# Patient Record
Sex: Female | Born: 1935 | Race: White | Hispanic: No | Smoking: Never smoker
Health system: Southern US, Community
[De-identification: ages and names within clinical notes are randomized; demographics above are authoritative.]

## PROBLEM LIST (undated history)

## (undated) DIAGNOSIS — M1990 Unspecified osteoarthritis, unspecified site: Secondary | ICD-10-CM

## (undated) DIAGNOSIS — I10 Essential (primary) hypertension: Secondary | ICD-10-CM

## (undated) DIAGNOSIS — I517 Cardiomegaly: Secondary | ICD-10-CM

## (undated) DIAGNOSIS — R2689 Other abnormalities of gait and mobility: Secondary | ICD-10-CM

## (undated) DIAGNOSIS — R0602 Shortness of breath: Secondary | ICD-10-CM

## (undated) DIAGNOSIS — M8580 Other specified disorders of bone density and structure, unspecified site: Secondary | ICD-10-CM

## (undated) DIAGNOSIS — M549 Dorsalgia, unspecified: Secondary | ICD-10-CM

## (undated) DIAGNOSIS — I4891 Unspecified atrial fibrillation: Secondary | ICD-10-CM

## (undated) DIAGNOSIS — H269 Unspecified cataract: Secondary | ICD-10-CM

## (undated) DIAGNOSIS — I509 Heart failure, unspecified: Secondary | ICD-10-CM

## (undated) DIAGNOSIS — B029 Zoster without complications: Secondary | ICD-10-CM

## (undated) DIAGNOSIS — J90 Pleural effusion, not elsewhere classified: Secondary | ICD-10-CM

## (undated) HISTORY — DX: Essential (primary) hypertension: I10

## (undated) SURGERY — Surgical Case
Anesthesia: *Unknown

---
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
## 1998-11-07 ENCOUNTER — Other Ambulatory Visit: Admission: RE | Admit: 1998-11-07 | Discharge: 1998-11-07 | Payer: Self-pay | Admitting: Specialist

## 2000-09-21 ENCOUNTER — Other Ambulatory Visit: Admission: RE | Admit: 2000-09-21 | Discharge: 2000-09-21 | Payer: Self-pay | Admitting: Family Medicine

## 2001-09-27 ENCOUNTER — Other Ambulatory Visit: Admission: RE | Admit: 2001-09-27 | Discharge: 2001-09-27 | Payer: Self-pay | Admitting: Family Medicine

## 2002-11-08 ENCOUNTER — Other Ambulatory Visit: Admission: RE | Admit: 2002-11-08 | Discharge: 2002-11-08 | Payer: Self-pay | Admitting: Family Medicine

## 2003-12-25 ENCOUNTER — Other Ambulatory Visit: Admission: RE | Admit: 2003-12-25 | Discharge: 2003-12-25 | Payer: Self-pay | Admitting: Family Medicine

## 2003-12-25 ENCOUNTER — Encounter: Payer: Self-pay | Admitting: Family Medicine

## 2003-12-25 LAB — CONVERTED CEMR LAB: Pap Smear: NORMAL

## 2004-05-31 ENCOUNTER — Ambulatory Visit: Payer: Self-pay | Admitting: Family Medicine

## 2004-07-10 ENCOUNTER — Ambulatory Visit: Payer: Self-pay | Admitting: Family Medicine

## 2005-01-09 ENCOUNTER — Ambulatory Visit: Payer: Self-pay | Admitting: Family Medicine

## 2005-03-04 ENCOUNTER — Ambulatory Visit: Payer: Self-pay | Admitting: Family Medicine

## 2005-03-12 ENCOUNTER — Ambulatory Visit: Payer: Self-pay | Admitting: Gastroenterology

## 2005-04-25 ENCOUNTER — Ambulatory Visit: Payer: Self-pay | Admitting: Family Medicine

## 2006-01-14 ENCOUNTER — Ambulatory Visit: Payer: Self-pay | Admitting: Family Medicine

## 2006-04-23 ENCOUNTER — Ambulatory Visit: Payer: Self-pay | Admitting: Family Medicine

## 2006-06-01 ENCOUNTER — Ambulatory Visit: Payer: Self-pay | Admitting: Family Medicine

## 2006-06-12 ENCOUNTER — Ambulatory Visit: Payer: Self-pay | Admitting: Family Medicine

## 2007-03-02 ENCOUNTER — Encounter: Payer: Self-pay | Admitting: Family Medicine

## 2007-03-02 DIAGNOSIS — H519 Unspecified disorder of binocular movement: Secondary | ICD-10-CM | POA: Insufficient documentation

## 2007-03-02 DIAGNOSIS — I10 Essential (primary) hypertension: Secondary | ICD-10-CM | POA: Insufficient documentation

## 2007-04-02 ENCOUNTER — Ambulatory Visit: Payer: Self-pay | Admitting: Family Medicine

## 2007-04-02 DIAGNOSIS — Z860100 Personal history of colon polyps, unspecified: Secondary | ICD-10-CM | POA: Insufficient documentation

## 2007-04-05 LAB — CONVERTED CEMR LAB
Albumin: 4.2 g/dL (ref 3.5–5.2)
Alkaline Phosphatase: 63 units/L (ref 39–117)
Basophils Absolute: 0 10*3/uL (ref 0.0–0.1)
Calcium: 9.8 mg/dL (ref 8.4–10.5)
Chloride: 102 meq/L (ref 96–112)
Cholesterol: 187 mg/dL (ref 0–200)
Eosinophils Absolute: 0.2 10*3/uL (ref 0.0–0.6)
GFR calc Af Amer: 64 mL/min
Glucose, Bld: 114 mg/dL — ABNORMAL HIGH (ref 70–99)
HDL: 36.9 mg/dL — ABNORMAL LOW (ref >39.0)
Lymphocytes Relative: 19.8 % (ref 12.0–46.0)
MCHC: 34.2 g/dL (ref 30.0–36.0)
MCV: 91.4 fL (ref 78.0–100.0)
Monocytes Relative: 6.3 % (ref 3.0–11.0)
Neutro Abs: 5.8 10*3/uL (ref 1.4–7.7)
Platelets: 322 10*3/uL (ref 150–400)
TSH: 4.71 microintl units/mL (ref 0.35–5.50)
Total Protein: 8.1 g/dL (ref 6.0–8.3)
Vit D, 1,25-Dihydroxy: 39 (ref 30–89)

## 2007-04-27 ENCOUNTER — Encounter: Payer: Self-pay | Admitting: Family Medicine

## 2007-05-12 ENCOUNTER — Encounter: Payer: Self-pay | Admitting: Family Medicine

## 2007-05-18 ENCOUNTER — Encounter (INDEPENDENT_AMBULATORY_CARE_PROVIDER_SITE_OTHER): Payer: Self-pay | Admitting: *Deleted

## 2007-06-09 ENCOUNTER — Ambulatory Visit: Payer: Self-pay | Admitting: Gastroenterology

## 2007-06-09 ENCOUNTER — Encounter: Payer: Self-pay | Admitting: Family Medicine

## 2007-06-18 ENCOUNTER — Encounter: Payer: Self-pay | Admitting: Family Medicine

## 2007-07-05 ENCOUNTER — Ambulatory Visit: Payer: Self-pay | Admitting: Family Medicine

## 2008-01-13 ENCOUNTER — Encounter: Payer: Self-pay | Admitting: Family Medicine

## 2008-03-22 ENCOUNTER — Telehealth (INDEPENDENT_AMBULATORY_CARE_PROVIDER_SITE_OTHER): Payer: Self-pay | Admitting: *Deleted

## 2008-03-27 ENCOUNTER — Ambulatory Visit: Payer: Self-pay | Admitting: Family Medicine

## 2008-04-12 ENCOUNTER — Ambulatory Visit: Payer: Self-pay | Admitting: Family Medicine

## 2008-04-12 ENCOUNTER — Other Ambulatory Visit: Admission: RE | Admit: 2008-04-12 | Discharge: 2008-04-12 | Payer: Self-pay | Admitting: Family Medicine

## 2008-04-12 ENCOUNTER — Encounter: Payer: Self-pay | Admitting: Family Medicine

## 2008-04-12 DIAGNOSIS — J309 Allergic rhinitis, unspecified: Secondary | ICD-10-CM | POA: Insufficient documentation

## 2008-04-14 LAB — CONVERTED CEMR LAB
Albumin: 4.1 g/dL (ref 3.5–5.2)
Basophils Relative: 0.7 % (ref 0.0–3.0)
Chloride: 102 meq/L (ref 96–112)
Eosinophils Absolute: 0.2 10*3/uL (ref 0.0–0.7)
Eosinophils Relative: 2.3 % (ref 0.0–5.0)
GFR calc Af Amer: 63 mL/min
GFR calc non Af Amer: 52 mL/min
HCT: 40.6 % (ref 36.0–46.0)
HDL: 33.9 mg/dL — ABNORMAL LOW (ref >39.0)
MCV: 94.7 fL (ref 78.0–100.0)
Monocytes Absolute: 0.4 10*3/uL (ref 0.1–1.0)
Neutro Abs: 5.9 10*3/uL (ref 1.4–7.7)
Platelets: 272 10*3/uL (ref 150–400)
Potassium: 3.8 meq/L (ref 3.5–5.1)
RBC: 4.28 M/uL (ref 3.87–5.11)
Sodium: 139 meq/L (ref 135–145)
Total Protein: 7.8 g/dL (ref 6.0–8.3)
Vit D, 1,25-Dihydroxy: 38 (ref 30–89)
WBC: 8.2 10*3/uL (ref 4.5–10.5)

## 2008-05-15 ENCOUNTER — Encounter: Payer: Self-pay | Admitting: Family Medicine

## 2008-06-02 ENCOUNTER — Telehealth: Payer: Self-pay | Admitting: Family Medicine

## 2008-06-09 ENCOUNTER — Encounter: Payer: Self-pay | Admitting: Family Medicine

## 2008-06-15 ENCOUNTER — Encounter: Payer: Self-pay | Admitting: Family Medicine

## 2008-07-04 ENCOUNTER — Ambulatory Visit: Payer: Self-pay | Admitting: Internal Medicine

## 2008-07-04 ENCOUNTER — Ambulatory Visit: Payer: Self-pay | Admitting: General Surgery

## 2008-07-10 ENCOUNTER — Encounter: Payer: Self-pay | Admitting: Family Medicine

## 2008-07-10 ENCOUNTER — Ambulatory Visit: Payer: Self-pay | Admitting: General Surgery

## 2008-07-12 ENCOUNTER — Encounter: Payer: Self-pay | Admitting: Family Medicine

## 2008-07-20 ENCOUNTER — Encounter: Payer: Self-pay | Admitting: Family Medicine

## 2009-01-08 ENCOUNTER — Telehealth: Payer: Self-pay | Admitting: Family Medicine

## 2009-01-22 ENCOUNTER — Encounter: Payer: Self-pay | Admitting: Family Medicine

## 2009-04-16 ENCOUNTER — Ambulatory Visit: Payer: Self-pay | Admitting: Family Medicine

## 2009-04-18 ENCOUNTER — Encounter (INDEPENDENT_AMBULATORY_CARE_PROVIDER_SITE_OTHER): Payer: Self-pay | Admitting: *Deleted

## 2009-04-18 LAB — CONVERTED CEMR LAB
ALT: 19 units/L (ref 0–35)
AST: 17 units/L (ref 0–37)
Albumin: 4.1 g/dL (ref 3.5–5.2)
Alkaline Phosphatase: 61 units/L (ref 39–117)
Basophils Relative: 1 % (ref 0.0–3.0)
Calcium: 9.1 mg/dL (ref 8.4–10.5)
Chloride: 102 meq/L (ref 96–112)
Cholesterol: 179 mg/dL (ref 0–200)
Eosinophils Absolute: 0.2 10*3/uL (ref 0.0–0.7)
Eosinophils Relative: 3.1 % (ref 0.0–5.0)
Hemoglobin: 14.6 g/dL (ref 12.0–15.0)
Lymphocytes Relative: 19.9 % (ref 12.0–46.0)
MCHC: 33.7 g/dL (ref 30.0–36.0)
MCV: 94.3 fL (ref 78.0–100.0)
Neutro Abs: 4.8 10*3/uL (ref 1.4–7.7)
Phosphorus: 3.6 mg/dL (ref 2.3–4.6)
Potassium: 4.6 meq/L (ref 3.5–5.1)
RBC: 4.59 M/uL (ref 3.87–5.11)
TSH: 3.63 microintl units/mL (ref 0.35–5.50)
Total Protein: 7.7 g/dL (ref 6.0–8.3)
WBC: 6.9 10*3/uL (ref 4.5–10.5)

## 2009-05-16 ENCOUNTER — Encounter: Payer: Self-pay | Admitting: Family Medicine

## 2009-05-28 ENCOUNTER — Encounter (INDEPENDENT_AMBULATORY_CARE_PROVIDER_SITE_OTHER): Payer: Self-pay | Admitting: *Deleted

## 2009-06-30 HISTORY — PX: BREAST BIOPSY: SHX20

## 2010-04-23 ENCOUNTER — Ambulatory Visit: Payer: Self-pay | Admitting: Family Medicine

## 2010-05-06 ENCOUNTER — Ambulatory Visit: Payer: Self-pay | Admitting: Family Medicine

## 2010-05-06 ENCOUNTER — Telehealth: Payer: Self-pay | Admitting: Family Medicine

## 2010-05-07 LAB — CONVERTED CEMR LAB
ALT: 18 units/L (ref 0–35)
AST: 18 units/L (ref 0–37)
Alkaline Phosphatase: 69 units/L (ref 39–117)
Basophils Relative: 0.2 % (ref 0.0–3.0)
CO2: 30 meq/L (ref 19–32)
Calcium: 9.7 mg/dL (ref 8.4–10.5)
Chloride: 101 meq/L (ref 96–112)
Direct LDL: 132.4 mg/dL
Eosinophils Relative: 2.3 % (ref 0.0–5.0)
GFR calc non Af Amer: 50.81 mL/min (ref >60)
HCT: 46 % (ref 36.0–46.0)
Hemoglobin: 15.9 g/dL — ABNORMAL HIGH (ref 12.0–15.0)
Lymphs Abs: 1.5 10*3/uL (ref 0.7–4.0)
MCV: 94.8 fL (ref 78.0–100.0)
Monocytes Absolute: 0.4 10*3/uL (ref 0.1–1.0)
Monocytes Relative: 4.6 % (ref 3.0–12.0)
Potassium: 4.5 meq/L (ref 3.5–5.1)
RBC: 4.86 M/uL (ref 3.87–5.11)
Sodium: 141 meq/L (ref 135–145)
TSH: 3.84 microintl units/mL (ref 0.35–5.50)
Total CHOL/HDL Ratio: 5
Triglycerides: 204 mg/dL — ABNORMAL HIGH (ref 0.0–149.0)
Vit D, 25-Hydroxy: 43 ng/mL (ref 30–89)
WBC: 8.9 10*3/uL (ref 4.5–10.5)

## 2010-05-28 ENCOUNTER — Encounter: Payer: Self-pay | Admitting: Family Medicine

## 2010-05-29 ENCOUNTER — Encounter: Payer: Self-pay | Admitting: Family Medicine

## 2010-05-30 LAB — HM DEXA SCAN

## 2010-06-04 ENCOUNTER — Encounter: Payer: Self-pay | Admitting: Family Medicine

## 2010-06-07 ENCOUNTER — Encounter: Payer: Self-pay | Admitting: Family Medicine

## 2010-06-12 ENCOUNTER — Ambulatory Visit: Payer: Self-pay | Admitting: Family Medicine

## 2010-06-12 DIAGNOSIS — E785 Hyperlipidemia, unspecified: Secondary | ICD-10-CM

## 2010-06-12 DIAGNOSIS — M810 Age-related osteoporosis without current pathological fracture: Secondary | ICD-10-CM

## 2010-07-30 NOTE — Assessment & Plan Note (Signed)
Summary: flu shot//lch  Nurse Visit   Allergies: 1)  Ace Inhibitors  Orders Added: 1)  Flu Vaccine 89yrs + MEDICARE PATIENTS [Q2039] 2)  Administration Flu vaccine - MCR [G0008]  Flu Vaccine Consent Questions     Do you have a history of severe allergic reactions to this vaccine? no    Any prior history of allergic reactions to egg and/or gelatin? no    Do you have a sensitivity to the preservative Thimersol? no    Do you have a past history of Guillan-Barre Syndrome? no    Do you currently have an acute febrile illness? no    Have you ever had a severe reaction to latex? no    Vaccine information given and explained to patient? yes    Are you currently pregnant? no    Lot Number:[id number]   Exp Date:12/28/2010   Site Given  Left Deltoid IM

## 2010-07-30 NOTE — Letter (Signed)
Summary: Results Follow up Letter  [place]  [address] [place], [place] [address]   Phone: [phone]  Fax: [phone]    06/04/2010 MRN: [id number]    [facility] [address] [place], [place]  [address]    Dear Ms. Jamesetta Orleans,  The following are the results of your recent test(s):  Test         Result    Pap Smear:        Normal _____  Not Normal _____ Comments: ______________________________________________________ Cholesterol: LDL(Bad cholesterol):         Your goal is less than:         HDL (Good cholesterol):       Your goal is more than: Comments:  ______________________________________________________ Mammogram:        Normal _____  Not Normal _____ Comments:  ___________________________________________________________________ Hemoccult:        Normal _____  Not normal _______ Comments:    _____________________________________________________________________ Other Tests:The bone density shows osteopenia has improved since last bone density. Please continue Calcium and Vitamin D. Will plan to re check bone density in 2 years.    We routinely do not discuss normal results over the telephone.  If you desire a copy of the results, or you have any questions about this information we can discuss them at your next office visit.   Sincerely,    Idamae Schuller Tower,MD  MT/ri

## 2010-07-30 NOTE — Assessment & Plan Note (Signed)
Summary: CPX/CLE   Vital Signs:  Patient profile:   75 year old female Height:      65.25 inches Weight:      186.50 pounds BMI:     30.91 Temp:     98 degrees F oral Pulse rate:   80 / minute Pulse rhythm:   regular BP sitting:   140 / 90  (left arm) Cuff size:   large  Vitals Entered By: Lewanda Rife LPN (May 06, 2010 10:16 AM)  Serial Vital Signs/Assessments:  Time      Position  BP       Pulse  Resp  Temp     By                     149/90                         Judith Part MD  CC: check up , post menup   History of Present Illness: here for check up of chronic med problems  has been feeling good   wt is up 2 lb with bmi of 30  has been trying to eat healthy -- cooking more at home , more soup and salads  watching her salt  getting exercise but not every day- walking mostly  does trips intentionally where she has to do a lot of walking   HTN has been fairly controlled  bp first check 140/90--has not checked outside office   OP past - with fx, last dexa osteopenia - improved 11/09 has been on fosamax too long  is due ca and D  needs to change hctz -- too frequent urination  colonosc 12/08- polyp hyperplastic  re check-- ? due   pap nl 10/09 no abn paps  no new partners or symptoms    mam 11/10 self exam - is good about that - no lumps or changes   Td 07 flu shot last mo  ptx up to date  has had shingles   Allergies: 1)  Ace Inhibitors  Past History:  Past Medical History: Last updated: 04-23-2009 Hypertension Osteoporosis allergic rhinitis strabismus shingles in past   GI -- Dr Nils Flack   Past Surgical History: Last updated: 05/24/2008 Dexa- osteoporosis LS, osteopenia hip (09/2000),    worse osteopenia femoral neck, OP LS (12/2003),   OP lS, osteopenia femoral neck (04/2006) Fractural left clavicle (1974) Fracture right clavicle (1992) Colonoscopy- polyps, diverticulosis (02/2005) colonosc 12/08 polyp,  diverticulosis (11/09) dexa - osteopenia- very slt imp in spine  T score (-2.49)  Family History: Last updated: April 23, 2009 Father: deceased age 15- parkinson's Mother: deceased age 43- HTN, liver cancer Siblings: 2 sisters aunt with breast ca sister colon ca, depression, dementia   Social History: Last updated: 04/12/2008 Marital Status: divorced Children: 3 Occupation:  non smoker  never drinks alcohol   Risk Factors: Smoking Status: never (03/02/2007)  Review of Systems General:  Denies fatigue, loss of appetite, and malaise. Eyes:  Denies blurring and eye irritation. CV:  Denies chest pain or discomfort, lightheadness, palpitations, shortness of breath with exertion, and swelling of feet. Resp:  Denies cough and shortness of breath. GI:  Denies abdominal pain, bloody stools, change in bowel habits, indigestion, and nausea. GU:  Denies dysuria and urinary frequency. MS:  Denies low back pain and cramps. Derm:  Denies itching, lesion(s), poor wound healing, and rash. Neuro:  Denies numbness and tingling. Psych:  Denies anxiety and  depression. Endo:  Denies cold intolerance, excessive thirst, excessive urination, and heat intolerance. Heme:  Denies abnormal bruising and bleeding.  Physical Exam  General:  overweight but generally well appearing  Head:  normocephalic, atraumatic, and no abnormalities observed.   Eyes:  vision grossly intact, pupils equal, pupils round, and pupils reactive to light.  no conjunctival pallor, injection or icterus  strabismus is baseline  Ears:  R ear normal and L ear normal.   Nose:  no nasal discharge.   Mouth:  pharynx pink and moist.   Neck:  supple with full rom and no masses or thyromegally, no JVD or carotid bruit  Chest Wall:  No deformities, masses, or tenderness noted. Breasts:  No mass, nodules, thickening, tenderness, bulging, retraction, inflamation, nipple discharge or skin changes noted.   Lungs:  Normal respiratory effort,  chest expands symmetrically. Lungs are clear to auscultation, no crackles or wheezes. Heart:  Normal rate and regular rhythm. S1 and S2 normal without gallop, murmur, click, rub or other extra sounds. Abdomen:  Bowel sounds positive,abdomen soft and non-tender without masses, organomegaly or hernias noted. no renal bruits  Msk:  No deformity or scoliosis noted of thoracic or lumbar spine.  no acute joint changes  Pulses:  R and L carotid,radial,femoral,dorsalis pedis and posterior tibial pulses are full and equal bilaterally Extremities:  No clubbing, cyanosis, edema, or deformity noted with normal full range of motion of all joints.   Neurologic:  sensation intact to light touch, gait normal, and DTRs symmetrical and normal.   Skin:  Intact without suspicious lesions or rashes Cervical Nodes:  No lymphadenopathy noted Axillary Nodes:  No palpable lymphadenopathy Inguinal Nodes:  No significant adenopathy Psych:  normal affect, talkative and pleasant    Impression & Recommendations:  Problem # 1:  OTHER SCREENING MAMMOGRAM (ICD-V76.12) Assessment Comment Only annual mammogram scheduled adv pt to continue regular self breast exams non remarkable breast exam today  Orders: Radiology Referral (Radiology)  Problem # 2:  HYPERTENSION (ICD-401.9) Assessment: Deteriorated  bp is high and pt no longer tol fluid pill due to frequent urination will stop that and replace cardizem with norvasc 5  f/u 4-6 wk  may need to inc dose at that time  disc imp of exercise and wt loss  lab today The following medications were removed from the medication list:    Hydrochlorothiazide 25 Mg Tabs (Hydrochlorothiazide) ..... One by mouth once daily    Cardizem Cd 240 Mg Cp24 (Diltiazem hcl coated beads) ..... One by mouth once daily Her updated medication list for this problem includes:    Cozaar 100 Mg Tabs (Losartan potassium) ..... One by mouth once daily    Norvasc 5 Mg Tabs (Amlodipine besylate)  .Marland Kitchen... 1 by mouth once daily  Orders: Venipuncture (52841) TLB-Lipid Panel (80061-LIPID) TLB-Renal Function Panel (80069-RENAL) TLB-CBC Platelet - w/Differential (85025-CBCD) TLB-TSH (Thyroid Stimulating Hormone) (84443-TSH) TLB-Hepatic/Liver Function Pnl (80076-HEPATIC) T-Vitamin D (25-Hydroxy) (32440-10272)  BP today: 140/90 Prior BP: 150/82 (04/16/2009)  Labs Reviewed: K+: 4.6 (04/16/2009) Creat: : 1.1 (04/16/2009)   Chol: 179 (04/16/2009)   HDL: 35.00 (04/16/2009)   LDL: 112 (04/16/2009)   TG: 159.0 (04/16/2009)  Problem # 3:  OSTEOPOROSIS (ICD-733.00) Assessment: Unchanged schedule dexa stop fosamax since over 5 y rev ca and vit D vit D level today- disc at f/u The following medications were removed from the medication list:    Fosamax 70 Mg Tabs (Alendronate sodium) ..... One by mouth once weekly    Calcium 600 Mg Tabs (  Calcium) ..... One by mouth bid Her updated medication list for this problem includes:    Vitamin D 1000 Unit Tabs (Cholecalciferol) .Marland Kitchen... 1200 units per day in addition to what is in calcium supplement.    Calcium Carbonate-vitamin D 600-400 Mg-unit Tabs (Calcium carbonate-vitamin d) ..... One tablet by mouth twice a day  Orders: Venipuncture (09811) TLB-Lipid Panel (80061-LIPID) TLB-Renal Function Panel (80069-RENAL) TLB-CBC Platelet - w/Differential (85025-CBCD) TLB-TSH (Thyroid Stimulating Hormone) (84443-TSH) TLB-Hepatic/Liver Function Pnl (80076-HEPATIC) T-Vitamin D (25-Hydroxy) (91478-29562) Radiology Referral (Radiology)  Complete Medication List: 1)  Cozaar 100 Mg Tabs (Losartan potassium) .... One by mouth once daily 2)  Vitamin D 1000 Unit Tabs (Cholecalciferol) .Marland Kitchen.. 1200 units per day in addition to what is in calcium supplement. 3)  Calcium Carbonate-vitamin D 600-400 Mg-unit Tabs (Calcium carbonate-vitamin d) .... One tablet by mouth twice a day 4)  Norvasc 5 Mg Tabs (Amlodipine besylate) .Marland Kitchen.. 1 by mouth once daily  Patient  Instructions: 1)  please call Dr Bernita Raisin office (GI ) for pt to see when next colonoscopy is due  2)  we will schedule mammogram and dexa (bone density) at check out 3)  the current recommendation for calcium intake is 1200-1500 mg daily with [phone] IU of vitamin D  4)  stop your fosamax  5)  stop the hctz 6)  stop the cardizem  7)  start norvasc 5 mg daily for blood pressure 8)  follow up in 4-6 weeks to re check blood pressure and review labs 9)  lab today  Prescriptions: NORVASC 5 MG TABS (AMLODIPINE BESYLATE) 1 by mouth once daily  #30 x 2   Entered and Authorized by:   Judith Part MD   Signed by:   Judith Part MD on 05/06/2010   Method used:   Print then Give to Patient   RxID:   [id number] COZAAR 100 MG  TABS (LOSARTAN POTASSIUM) one by mouth once daily  #90 x 3   Entered and Authorized by:   Judith Part MD   Signed by:   Judith Part MD on 05/06/2010   Method used:   Print then Give to Patient   RxID:   [id number]    Orders Added: 1)  Venipuncture [64403] 2)  TLB-Lipid Panel [80061-LIPID] 3)  TLB-Renal Function Panel [80069-RENAL] 4)  TLB-CBC Platelet - w/Differential [85025-CBCD] 5)  TLB-TSH (Thyroid Stimulating Hormone) [84443-TSH] 6)  TLB-Hepatic/Liver Function Pnl [80076-HEPATIC] 7)  T-Vitamin D (25-Hydroxy) [47425-95638] 8)  Radiology Referral [Radiology] 9)  Radiology Referral [Radiology] 10)  Est. Patient Level IV [75643]    Current Allergies (reviewed today): ACE INHIBITORS

## 2010-07-30 NOTE — Progress Notes (Signed)
Summary: regarding colonoscopy  Phone Note Call from Patient   Caller: Patient Call For: Judith Part MD Summary of Call: Pt called to let you know that her GI office said she is not due for another colonoscopy until 05/2012. Initial call taken by: Lowella Petties CMA, AAMA,  May 06, 2010 3:05 PM  Follow-up for Phone Call        thanks for the update - will note that Follow-up by: Judith Part MD,  May 06, 2010 4:42 PM      Preventive Care Screening  Colonoscopy:    Next Due:  05/2012

## 2010-08-01 NOTE — Assessment & Plan Note (Signed)
Summary: 4-6 WEEK FOLLOW UP RECHECK BLOOD PRESSURE/REVIEW LABS/[facility]   Vital Signs:  Patient profile:   75 year old female Height:      65.25 inches Weight:      184.75 pounds BMI:     30.62 Temp:     98 degrees F oral Pulse rate:   64 / minute Pulse rhythm:   regular BP sitting:   152 / 90  (left arm) Cuff size:   large  Vitals Entered By: Lewanda Rife LPN (2010/06/20 9:37 AM)  Serial Vital Signs/Assessments:  Time      Position  BP       Pulse  Resp  Temp     By                     150/84                         Judith Part MD  CC: 4-6 wk f/u with BP and review labs   History of Present Illness: here for f/u of HTN and osteopenia and lipids   is feeling good overall   wt is down 2 lb with bmi of 30  152/90-- still high  has not been checking at home  no side eff or problems at all   changed cardizem to norvasc 5 at last visit pt does not want diuretic due to frequent urination that is intolerable --- so happy to not be urinating as often  osteopenia on recent dexa was improved from past was on 5 y of bisphosphenate vit D is 43-- up from 31 takes 1200 international units D per day with her calcium   also started fish oil 2 a day   borderline hyperglycemia sugar 106- this is fairly stable  diet was not good on vacation -- but at home is eating a lot healthier and watches salt and sugar   lipids are up a bit -- ? 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  Allergies: 1)  Ace Inhibitors 2)  * Diuretics  Past History:  Past Surgical History: Last updated: 05/24/2008 Dexa- osteoporosis LS, osteopenia hip (09/2000),    worse osteopenia femoral neck, OP LS (12/2003),   OP lS, osteopenia femoral neck (04/2006) Fractural left clavicle (1610) Fracture right clavicle (9604) Colonoscopy- polyps, diverticulosis (02/2005) colonosc 12/08  polyp, diverticulosis (11/09) dexa - osteopenia- very slt imp in spine  T score (-2.49)  Family History: Last updated: 06-20-2010 Father: deceased age 3- parkinson's Mother: deceased age 51- HTN, liver cancer/ gallbladder ?  Siblings: 2 sisters aunt with breast ca sister colon ca, depression, dementia  sister with CLL  Social History: Last updated: 04/12/2008 Marital Status: divorced Children: 3 Occupation:  non smoker  never drinks alcohol   Risk Factors: Smoking Status: never (03/02/2007)  Past Medical History: Hypertension Osteoporosis allergic rhinitis strabismus shingles in past hyperlipidemia    GI -- Dr Nils Flack   Family History: Father: deceased age 59- parkinson's Mother: deceased age 66- HTN, liver cancer/ gallbladder ?  Siblings: 2 sisters aunt with breast ca sister colon ca, depression, dementia  sister with CLL  Review of Systems General:  Denies fatigue, loss of appetite, and malaise. Eyes:  Denies blurring and eye irritation. CV:  Denies  chest pain or discomfort, palpitations, shortness of breath with exertion, and swelling of feet. Resp:  Denies cough and shortness of breath. GI:  Denies abdominal pain, bloody stools, change in bowel habits, indigestion, and nausea. GU:  Denies dysuria and urinary frequency. MS:  Denies muscle aches and cramps. Derm:  Denies itching, lesion(s), poor wound healing, and rash. Neuro:  Denies numbness and tingling. Psych:  mood is ok . Endo:  Denies cold intolerance, excessive thirst, excessive urination, and heat intolerance. Heme:  Denies abnormal bruising and bleeding.  Physical Exam  General:  overweight but generally well appearing  Head:  normocephalic, atraumatic, and no abnormalities observed.   Eyes:  vision grossly intact, pupils equal, pupils round, and pupils reactive to light.  no conjunctival pallor, injection or icterus  strabismus is baseline  Mouth:  pharynx pink and moist.   Neck:  supple  with full rom and no masses or thyromegally, no JVD or carotid bruit  Chest Wall:  No deformities, masses, or tenderness noted. Lungs:  Normal respiratory effort, chest expands symmetrically. Lungs are clear to auscultation, no crackles or wheezes. Heart:  Normal rate and regular rhythm. S1 and S2 normal without gallop, murmur, click, rub or other extra sounds. Abdomen:  no renal bruits  soft, non-tender, normal bowel sounds, no hepatomegaly, and no splenomegaly.   Msk:  No deformity or scoliosis noted of thoracic or lumbar spine.  no acute joint changes  Pulses:  R and L carotid,radial,femoral,dorsalis pedis and posterior tibial pulses are full and equal bilaterally Extremities:  No clubbing, cyanosis, edema, or deformity noted with normal full range of motion of all joints.   Neurologic:  sensation intact to light touch, gait normal, and DTRs symmetrical and normal.   Skin:  Intact without suspicious lesions or rashes Cervical Nodes:  No lymphadenopathy noted Psych:  normal affect, talkative and pleasant    Impression & Recommendations:  Problem # 1:  SCREENING FOR MLIG NEOP, BREAST, NOS (ICD-V76.10) Assessment Comment Only  pt has f/u on june 6th for poss ductal ectasia   Orders: Prescription Created Electronically [id number])  Problem # 2:  HYPERTENSION (ICD-401.9) Assessment: Unchanged  this is still not optimal pt tol amlodipine well - will inc to 10 and f/u 6 wk unfortunately -- does not tol diuretics Her updated medication list for this problem includes:    Cozaar 100 Mg Tabs (Losartan potassium) ..... One by mouth once daily    Norvasc 10 Mg Tabs (Amlodipine besylate) .Marland Kitchen... 1 by mouth once daily  BP today: 152/90 Prior BP: 140/90 (05/06/2010)  Labs Reviewed: K+: 4.5 (05/06/2010) Creat: : 1.1 (05/06/2010)   Chol: 208 (05/06/2010)   HDL: 43.00 (05/06/2010)   LDL: 112 (04/16/2009)   TG: 204.0 (05/06/2010)  Orders: Prescription Created Electronically [phone])  Problem #  3:  HYPERLIPIDEMIA (ICD-272.4) Assessment: Deteriorated  this is mild and likely diet related  disc low sat fat diet and reasons to make the change rev lab in detail  given guidelines for low chol eating  Labs Reviewed: SGOT: 18 (05/06/2010)   SGPT: 18 (05/06/2010)   HDL:43.00 (05/06/2010), 35.00 (04/16/2009)  LDL:112 (04/16/2009), 101 (04/12/2008)  Chol:208 (05/06/2010), 179 (04/16/2009)  Trig:204.0 (05/06/2010), 159.0 (04/16/2009)  Orders: Prescription Created Electronically [phone])  Problem # 4:  OSTEOPENIA (ICD-733.90) Assessment: Improved  osteopenia rev with pt - improved from previously  is done with 5 y of bisphosphenate -- wiil continuue with ca and D for now disc imp of exercise  will re check dexa in 2  y Her updated medication list for this problem includes:    Vitamin D 1000 Unit Tabs (Cholecalciferol) .Marland Kitchen... 1200 units per day in addition to what is in calcium supplement.    Calcium Carbonate-vitamin D 600-400 Mg-unit Tabs (Calcium carbonate-vitamin d) ..... One tablet by mouth twice a day  Orders: Prescription Created Electronically [phone])  Complete Medication List: 1)  Cozaar 100 Mg Tabs (Losartan potassium) .... One by mouth once daily 2)  Vitamin D 1000 Unit Tabs (Cholecalciferol) .Marland Kitchen.. 1200 units per day in addition to what is in calcium supplement. 3)  Calcium Carbonate-vitamin D 600-400 Mg-unit Tabs (Calcium carbonate-vitamin d) .... One tablet by mouth twice a day 4)  Norvasc 10 Mg Tabs (Amlodipine besylate) .Marland Kitchen.. 1 by mouth once daily 5)  Fish Oil Oil (Fish oil) .... Two capsules by mouth daily  Patient Instructions: 1)  you can raise your HDL (good cholesterol) by increasing exercise and eating omega 3 fatty acid supplement like fish oil or flax seed oil over the counter 2)  you can lower LDL (bad cholesterol) by limiting saturated fats in diet like red meat, fried foods, egg yolks, fatty breakfast meats, high fat dairy products and shellfish  3)  really  work on weight loss  4)  increase norvasc (amlodipine) from 5 to 10 mg to get bp down  5)  follow up in 6 weeks to see how it is working  Prescriptions: NORVASC 10 MG TABS (AMLODIPINE BESYLATE) 1 by mouth once daily  #90 x 3   Entered and Authorized by:   Judith Part MD   Signed by:   Judith Part MD on 06/12/2010   Method used:   Electronically to        [facility]  [address] [id number] (retail)       [address]       [place], [place]  [address]       Ph: [phone] or [phone]       Fax: [phone]   RxID:   [id number]    Orders Added: 1)  Est. Patient Level IV [03474] 2)  Prescription Created Electronically [id number]    Current Allergies (reviewed today): ACE INHIBITORS * DIURETICS

## 2010-08-07 ENCOUNTER — Ambulatory Visit (INDEPENDENT_AMBULATORY_CARE_PROVIDER_SITE_OTHER): Payer: Medicare Other | Admitting: Family Medicine

## 2010-08-07 ENCOUNTER — Encounter: Payer: Self-pay | Admitting: Family Medicine

## 2010-08-07 DIAGNOSIS — I10 Essential (primary) hypertension: Secondary | ICD-10-CM

## 2010-08-07 DIAGNOSIS — E785 Hyperlipidemia, unspecified: Secondary | ICD-10-CM

## 2010-08-21 NOTE — Assessment & Plan Note (Signed)
Summary: 6 WEEK FOLLOW-UP/ DLO   Vital Signs:  Patient profile:   75 year old female Height:      65.25 inches Weight:      185.25 pounds BMI:     30.70 Temp:     97.9 degrees F oral Pulse rate:   76 / minute Pulse rhythm:   slight irrregular BP sitting:   134 / 84  (left arm) Cuff size:   large  Vitals Entered By: Lewanda Rife LPN (August 07, 2010 9:14 AM) CC: six wk f/u   History of Present Illness: here for f/u of HTN  last visit inc her norvasc to 10 mg and bp is better today 134/84  no problems with med - no side effects at all - loves it  also cheaper   does not check at all   is starting to work on exercise -walking eating better at home   Allergies: 1)  Ace Inhibitors 2)  * Diuretics  Past History:  Past Medical History: Last updated: 2010-07-09 Hypertension Osteoporosis allergic rhinitis strabismus shingles in past hyperlipidemia    GI -- Dr Nils Flack   Past Surgical History: Last updated: 05/24/2008 Dexa- osteoporosis LS, osteopenia hip (09/2000),    worse osteopenia femoral neck, OP LS (12/2003),   OP lS, osteopenia femoral neck (04/2006) Fractural left clavicle (1974) Fracture right clavicle (9811) Colonoscopy- polyps, diverticulosis (02/2005) colonosc 12/08 polyp, diverticulosis (11/09) dexa - osteopenia- very slt imp in spine  T score (-2.49)  Family History: Last updated: 2010-07-09 Father: deceased age 7- parkinson's Mother: deceased age 70- HTN, liver cancer/ gallbladder ?  Siblings: 2 sisters aunt with breast ca sister colon ca, depression, dementia  sister with CLL  Social History: Last updated: 04/12/2008 Marital Status: divorced Children: 3 Occupation:  non smoker  never drinks alcohol   Risk Factors: Smoking Status: never (03/02/2007)  Review of Systems General:  Denies fatigue, loss of appetite, and malaise. Eyes:  Denies blurring. CV:  Denies chest pain or discomfort, lightheadness, and palpitations. Resp:   Denies cough, shortness of breath, and wheezing. GI:  Denies indigestion and nausea. GU:  Denies urinary frequency. MS:  Denies muscle aches and cramps. Derm:  Denies itching, lesion(s), poor wound healing, and rash. Neuro:  Denies numbness and tingling. Endo:  Denies cold intolerance, excessive thirst, excessive urination, and heat intolerance. Heme:  Denies abnormal bruising and bleeding.  Physical Exam  General:  overweight but generally well appearing  Head:  normocephalic, atraumatic, and no abnormalities observed.   Eyes:  vision grossly intact, pupils equal, pupils round, and pupils reactive to light.  no conjunctival pallor, injection or icterus  strabismus is baseline  Mouth:  pharynx pink and moist.   Neck:  supple with full rom and no masses or thyromegally, no JVD or carotid bruit  Chest Wall:  No deformities, masses, or tenderness noted. Lungs:  Normal respiratory effort, chest expands symmetrically. Lungs are clear to auscultation, no crackles or wheezes. Heart:  Normal rate and regular rhythm. S1 and S2 normal without gallop, murmur, click, rub or other extra sounds. Abdomen:  no renal bruits  Pulses:  R and L carotid,radial,femoral,dorsalis pedis and posterior tibial pulses are full and equal bilaterally Extremities:  No clubbing, cyanosis, edema, or deformity noted with normal full range of motion of all joints.   Neurologic:  sensation intact to light touch, gait normal, and DTRs symmetrical and normal.   Skin:  Intact without suspicious lesions or rashes Cervical Nodes:  No lymphadenopathy noted  Psych:  normal affect, talkative and pleasant    Impression & Recommendations:  Problem # 1:  HYPERTENSION (ICD-401.9) Assessment Improved this is better with norvasc 10 mg continue this  disc healthy diet (low simple sugar/ choose complex carbs/ low sat fat) diet and exercise in detail  f/u 6 mo after labs Her updated medication list for this problem includes:     Cozaar 100 Mg Tabs (Losartan potassium) ..... One by mouth once daily    Norvasc 10 Mg Tabs (Amlodipine besylate) .Marland Kitchen... 1 by mouth once daily  Problem # 2:  HYPERLIPIDEMIA (ICD-272.4) Assessment: Unchanged  mild - needs to work on diet  rev low sat fat diet less shellfish rec - more fish with fins lab and f/u 6 mo   Labs Reviewed: SGOT: 18 (05/06/2010)   SGPT: 18 (05/06/2010)   HDL:43.00 (05/06/2010), 35.00 (04/16/2009)  LDL:112 (04/16/2009), 101 (04/12/2008)  Chol:208 (05/06/2010), 179 (04/16/2009)  Trig:204.0 (05/06/2010), 159.0 (04/16/2009)  Complete Medication List: 1)  Cozaar 100 Mg Tabs (Losartan potassium) .... One by mouth once daily 2)  Vitamin D 1000 Unit Tabs (Cholecalciferol) .... Two tablets by mouth daily 3)  Calcium Carbonate-vitamin D 600-400 Mg-unit Tabs (Calcium carbonate-vitamin d) .... One tablet by mouth twice a day 4)  Norvasc 10 Mg Tabs (Amlodipine besylate) .Marland Kitchen.. 1 by mouth once daily 5)  Fish Oil Oil (Fish oil) .... Two capsules by mouth daily  Patient Instructions: 1)  stay on current medicines 2)  bp is better  3)  you can raise your HDL (good cholesterol) by increasing exercise and eating omega 3 fatty acid supplement like fish oil or flax seed oil over the counter 4)  you can lower LDL (bad cholesterol) by limiting saturated fats in diet like red meat, fried foods, egg yolks, fatty breakfast meats, high fat dairy products and shellfish  5)  keep watching salt  6)  aim for exercise 5 days per week  7)  schedule fasting lab and then follow up in 6 months-- lipid/ast/alt/ renal    Orders Added: 1)  Est. Patient Level III [16109]    Current Allergies (reviewed today): ACE INHIBITORS * DIURETICS

## 2011-01-16 ENCOUNTER — Encounter: Payer: Self-pay | Admitting: Family Medicine

## 2011-01-16 ENCOUNTER — Telehealth: Payer: Self-pay

## 2011-01-16 NOTE — Telephone Encounter (Addendum)
I called  Imaging we have not received 6 mth f/u mammo. Spoke with Marchelle Folks at [facility] and pt had right diagnostic mammogram on 12/02/10. Dr Milinda Antis did not receive report and I asked for faxed report. Dee scanned into pt's chart and there is a hard copy on your shelf also. Thank you. Pt already has a 6 month f/u appt with Dr Milinda Antis scheduled on 02/10/11.

## 2011-01-17 ENCOUNTER — Telehealth: Payer: Self-pay | Admitting: Family Medicine

## 2011-01-17 NOTE — Telephone Encounter (Signed)
6 mth follow up scheduled Dec 5th at 1030am at [facility].  Called pt left message..Linda Johnson

## 2011-01-17 NOTE — Telephone Encounter (Signed)
I signed it and put in IN box for review and scan  Need to see if she needs [place] to make her 6 mo f/u or not thanks

## 2011-01-28 NOTE — Telephone Encounter (Signed)
Spoke w/ pt, she is aware of the repeat mammogram in 6 mths, at [facility], Appt scheduled for Dec 5,2012 at 1030am. [name] said she was aware..Linda Johnson

## 2011-01-30 NOTE — Telephone Encounter (Signed)
Linda Johnson had taken the paper report from my basket on my desk to help with my calls. Please see telephone note 01/17/11. Pt is aware of appt for mammogram  in 6 months.

## 2011-02-03 ENCOUNTER — Other Ambulatory Visit (INDEPENDENT_AMBULATORY_CARE_PROVIDER_SITE_OTHER): Payer: Medicare Other | Admitting: Family Medicine

## 2011-02-03 DIAGNOSIS — I10 Essential (primary) hypertension: Secondary | ICD-10-CM

## 2011-02-03 DIAGNOSIS — E785 Hyperlipidemia, unspecified: Secondary | ICD-10-CM

## 2011-02-03 LAB — RENAL FUNCTION PANEL
Albumin: 4.3 g/dL (ref 3.5–5.2)
CO2: 28 mEq/L (ref 19–32)
Calcium: 9.4 mg/dL (ref 8.4–10.5)
Chloride: 99 mEq/L (ref 96–112)
Phosphorus: 3.8 mg/dL (ref 2.3–4.6)
Potassium: 4.6 mEq/L (ref 3.5–5.1)

## 2011-02-03 LAB — LIPID PANEL
Cholesterol: 165 mg/dL (ref 0–200)
HDL: 40.6 mg/dL (ref >39.00)
Triglycerides: 151 mg/dL — ABNORMAL HIGH (ref 0.0–149.0)

## 2011-02-03 LAB — AST: AST: 17 U/L (ref 0–37)

## 2011-02-05 ENCOUNTER — Other Ambulatory Visit: Payer: BC Managed Care – PPO

## 2011-02-10 ENCOUNTER — Ambulatory Visit (INDEPENDENT_AMBULATORY_CARE_PROVIDER_SITE_OTHER): Payer: Medicare Other | Admitting: Family Medicine

## 2011-02-10 ENCOUNTER — Encounter: Payer: Self-pay | Admitting: Family Medicine

## 2011-02-10 VITALS — BP 124/80 | HR 84 | Temp 98.0°F | Ht 65.25 in | Wt 186.5 lb

## 2011-02-10 DIAGNOSIS — I10 Essential (primary) hypertension: Secondary | ICD-10-CM

## 2011-02-10 DIAGNOSIS — E785 Hyperlipidemia, unspecified: Secondary | ICD-10-CM

## 2011-02-10 MED ORDER — LOSARTAN POTASSIUM 100 MG PO TABS: 100.0000 mg | ORAL_TABLET | Freq: Every day | ORAL | Status: DC

## 2011-02-10 MED ORDER — AMLODIPINE BESYLATE 10 MG PO TABS: 10.0000 mg | ORAL_TABLET | Freq: Every day | ORAL | Status: DC

## 2011-02-10 NOTE — Assessment & Plan Note (Signed)
This is improved with better diet Rev sat fats in different foods Urged to exercise and loose wt

## 2011-02-10 NOTE — Patient Instructions (Addendum)
Blood pressure is good  Cholesterol is good  Keep working on diet and exercise Get more supportive shoe and if ankle does not improve call for appointment with Dr Patsy Lager  Schedule 30 minute check up (annual exam ) with lab prior in 6 months

## 2011-02-10 NOTE — Progress Notes (Signed)
Subjective:    Patient ID: Linda Johnson, female    DOB: 1936/06/01, 75 y.o.   MRN: [id number]  HPI Here for f/u of HTN and lipids  Is feeling ok -- no new medical problems   Wt is stable/ overwt   HTn - good bp at 124/80 today Stable No ha or cp or palpitations  No side eff of 2 meds  Exercise has been trying with that  Some walking - not enough though   Lipids in good control with LDL 94 - that is improved HDL right at 40 Diet-- is watching it for fats in general -- eating fat free and no fried foods Hard to give up red meat   Lab Results  Component Value Date   CHOL 165 02/03/2011   CHOL 208* 05/06/2010   CHOL 179 04/16/2009   Lab Results  Component Value Date   HDL 40.60 02/03/2011   HDL 43.00 05/06/2010   HDL 40.98* 04/16/2009   Lab Results  Component Value Date   LDLCALC 94 02/03/2011   LDLCALC 112* 04/16/2009   LDLCALC 101* 04/12/2008   Lab Results  Component Value Date   TRIG 151.0* 02/03/2011   TRIG 204.0* 05/06/2010   TRIG 159.0* 04/16/2009   Lab Results  Component Value Date   CHOLHDL 4 02/03/2011   CHOLHDL 5 05/06/2010   CHOLHDL 5 04/16/2009   Lab Results  Component Value Date   LDLDIRECT 132.4 05/06/2010   LDLDIRECT 107.9 04/02/2007     Patient Active Problem List  Diagnoses  . STRABISMUS  . HYPERTENSION  . ALLERGIC RHINITIS CAUSE UNSPECIFIED  . HX, PERSONAL, COLONIC POLYPS  . HYPERLIPIDEMIA  . OSTEOPENIA   No past medical history on file. No past surgical history on file. History  Substance Use Topics  . Smoking status: Never Smoker   . Smokeless tobacco: Not on file  . Alcohol Use: Not on file   No family history on file. Allergies  Allergen Reactions  . Ace Inhibitors     REACTION: cough   No current outpatient prescriptions on file prior to visit.    L ankle has been sore/ swollen- no recent trauma   Review of Systems Review of Systems  Constitutional: Negative for fever, appetite change, fatigue and unexpected weight change.    Eyes: Negative for pain and visual disturbance.  Respiratory: Negative for cough and shortness of breath.   Cardiovascular: Negative. For cp and palpitations   Gastrointestinal: Negative for nausea, diarrhea and constipation.  Genitourinary: Negative for urgency and frequency.  Skin: Negative for pallor. no rash  Neurological: Negative for weakness, light-headedness, numbness and headaches.  Hematological: Negative for adenopathy. Does not bruise/bleed easily.  Psychiatric/Behavioral: Negative for dysphoric mood. The patient is not nervous/anxious.         Objective:   Physical Exam  Constitutional: She appears well-nourished. No distress.       overwt and well appearing   HENT:  Head: Normocephalic and atraumatic.  Mouth/Throat: Oropharynx is clear and moist.  Eyes: Conjunctivae and EOM are normal. Pupils are equal, round, and reactive to light.  Neck: Normal range of motion. Neck supple. No JVD present. Carotid bruit is not present. Erythema present. No thyromegaly present.  Cardiovascular: Normal rate, regular rhythm, normal heart sounds and intact distal pulses.   Pulmonary/Chest: Effort normal and breath sounds normal. No respiratory distress. She has no wheezes.  Abdominal: Soft. Bowel sounds are normal. She exhibits no distension, no abdominal bruit and no mass. There is  no tenderness.  Musculoskeletal: She exhibits no edema and no tenderness.  Lymphadenopathy:    She has no cervical adenopathy.  Neurological: She is alert. She has normal reflexes. Coordination normal.  Skin: Skin is warm and dry. No rash noted. No erythema. No pallor.  Psychiatric: She has a normal mood and affect.          Assessment & Plan:

## 2011-02-10 NOTE — Assessment & Plan Note (Signed)
Good control on current meds  Rev low sodium diet and plan for exercise  Continue to monitor

## 2011-04-01 ENCOUNTER — Ambulatory Visit (INDEPENDENT_AMBULATORY_CARE_PROVIDER_SITE_OTHER): Payer: Medicare Other

## 2011-04-01 DIAGNOSIS — Z23 Encounter for immunization: Secondary | ICD-10-CM

## 2011-06-09 ENCOUNTER — Encounter: Payer: Self-pay | Admitting: Family Medicine

## 2011-06-13 ENCOUNTER — Encounter: Payer: Self-pay | Admitting: *Deleted

## 2011-08-10 ENCOUNTER — Telehealth: Payer: Self-pay | Admitting: Family Medicine

## 2011-08-10 DIAGNOSIS — M949 Disorder of cartilage, unspecified: Secondary | ICD-10-CM

## 2011-08-10 DIAGNOSIS — E785 Hyperlipidemia, unspecified: Secondary | ICD-10-CM

## 2011-08-10 DIAGNOSIS — I10 Essential (primary) hypertension: Secondary | ICD-10-CM

## 2011-08-10 NOTE — Telephone Encounter (Signed)
Message copied by Judy Pimple on Sun Aug 10, 2011  8:43 PM ------      Message from: Alvina Chou      Created: Wed Aug 06, 2011 11:46 AM      Regarding: Lab orders for 08-11-11       Patient is scheduled for CPX labs, please order future labs, Thanks , Camelia Eng

## 2011-08-11 ENCOUNTER — Other Ambulatory Visit (INDEPENDENT_AMBULATORY_CARE_PROVIDER_SITE_OTHER): Payer: Medicare Other

## 2011-08-11 DIAGNOSIS — E785 Hyperlipidemia, unspecified: Secondary | ICD-10-CM

## 2011-08-11 DIAGNOSIS — M949 Disorder of cartilage, unspecified: Secondary | ICD-10-CM

## 2011-08-11 DIAGNOSIS — I10 Essential (primary) hypertension: Secondary | ICD-10-CM

## 2011-08-11 LAB — CBC WITH DIFFERENTIAL/PLATELET
Basophils Absolute: 0 10*3/uL (ref 0.0–0.1)
Basophils Relative: 0.5 % (ref 0.0–3.0)
Eosinophils Absolute: 0.2 10*3/uL (ref 0.0–0.7)
HCT: 45.1 % (ref 36.0–46.0)
Hemoglobin: 15.3 g/dL — ABNORMAL HIGH (ref 12.0–15.0)
Lymphs Abs: 1.8 10*3/uL (ref 0.7–4.0)
MCHC: 33.9 g/dL (ref 30.0–36.0)
Neutro Abs: 5.3 10*3/uL (ref 1.4–7.7)
RDW: 13.3 % (ref 11.5–14.6)

## 2011-08-11 LAB — COMPREHENSIVE METABOLIC PANEL
ALT: 17 U/L (ref 0–35)
AST: 17 U/L (ref 0–37)
Albumin: 4.2 g/dL (ref 3.5–5.2)
BUN: 24 mg/dL — ABNORMAL HIGH (ref 6–23)
Calcium: 9.2 mg/dL (ref 8.4–10.5)
Chloride: 102 mEq/L (ref 96–112)
Potassium: 4.3 mEq/L (ref 3.5–5.1)

## 2011-08-11 LAB — TSH: TSH: 4.88 u[IU]/mL (ref 0.35–5.50)

## 2011-08-11 LAB — LIPID PANEL
Total CHOL/HDL Ratio: 4
Triglycerides: 99 mg/dL (ref 0.0–149.0)

## 2011-08-12 LAB — VITAMIN D 25 HYDROXY (VIT D DEFICIENCY, FRACTURES): Vit D, 25-Hydroxy: 51 ng/mL (ref 30–89)

## 2011-08-18 ENCOUNTER — Encounter: Payer: Self-pay | Admitting: Family Medicine

## 2011-08-18 ENCOUNTER — Ambulatory Visit (INDEPENDENT_AMBULATORY_CARE_PROVIDER_SITE_OTHER): Payer: Medicare Other | Admitting: Family Medicine

## 2011-08-18 VITALS — BP 138/74 | HR 96 | Temp 97.7°F | Ht 65.0 in | Wt 193.2 lb

## 2011-08-18 DIAGNOSIS — E785 Hyperlipidemia, unspecified: Secondary | ICD-10-CM

## 2011-08-18 DIAGNOSIS — E669 Obesity, unspecified: Secondary | ICD-10-CM | POA: Insufficient documentation

## 2011-08-18 DIAGNOSIS — Z86010 Personal history of colonic polyps: Secondary | ICD-10-CM

## 2011-08-18 DIAGNOSIS — I10 Essential (primary) hypertension: Secondary | ICD-10-CM

## 2011-08-18 DIAGNOSIS — M949 Disorder of cartilage, unspecified: Secondary | ICD-10-CM

## 2011-08-18 NOTE — Assessment & Plan Note (Signed)
Chol up a bit -needs to watch diet more carefully Disc goals for lipids and reasons to control them Rev labs with pt Rev low sat fat diet in detail

## 2011-08-18 NOTE — Assessment & Plan Note (Signed)
dexa 1 year ago - a bit improved Rev ca and D Rev need for exercise Rev safety concerns with fx risk

## 2011-08-18 NOTE — Assessment & Plan Note (Signed)
bp in fair control at this time  No changes needed  Disc lifstyle change with low sodium diet and exercise   

## 2011-08-18 NOTE — Patient Instructions (Signed)
For better health - you really need to exercise 5 days per week- either inside our out (get videos for inside) You will be due for colonosc 12/13 - call us to schedule then  If you are interested in a shingles/zoster vaccine - call your insurance to check on coverage,( you should not get it within 1 month of other vaccines) , then call us for a prescription  for it to take to a pharmacy that gives the shot   continue calcium and vitamin D Eat healthier foods (Avoid red meat/ fried foods/ egg yolks/ fatty breakfast meats/ butter, cheese and high fat dairy/ and shellfish  ) and cut portions by 1/4 for weight loss Try to get 1200-1500 mg of calcium per day with at least 1000 iu of vitamin D - for bone health

## 2011-08-18 NOTE — Assessment & Plan Note (Signed)
Discussed how this problem influences overall health and the risks it imposes  Reviewed plan for weight loss with lower calorie diet (via better food choices and also portion control or program like weight watchers) and exercise building up to or more than 30 minutes 5 days per week including some aerobic activity    

## 2011-08-18 NOTE — Progress Notes (Signed)
Subjective:    Patient ID: Linda Johnson, female    DOB: 1936/04/18, 76 y.o.   MRN: [id number]  HPI Here for check up of chronic medical conditions and to review health mt list  Has been feeling well - no complaints at all   bp is 138/74    Today No cp or palpitations or headaches or edema  No side effects to medicines    On cozaar and norvasc  Wt is up 7 lb with bmi of 32 Is unhappy about that  Is still walking - not as much in winter - enjoys walking  Is watching what she eats - did slip over holidays - now is watching labels / less fat  No chips or snacks   Lipids are up slt Lab Results  Component Value Date   CHOL 185 08/11/2011   CHOL 165 02/03/2011   CHOL 208* 05/06/2010   Lab Results  Component Value Date   HDL 48.30 08/11/2011   HDL 14.78 02/03/2011   HDL 43.00 05/06/2010   Lab Results  Component Value Date   LDLCALC 117* 08/11/2011   LDLCALC 94 02/03/2011   LDLCALC 112* 04/16/2009   Lab Results  Component Value Date   TRIG 99.0 08/11/2011   TRIG 151.0* 02/03/2011   TRIG 204.0* 05/06/2010   Lab Results  Component Value Date   CHOLHDL 4 08/11/2011   CHOLHDL 4 02/03/2011   CHOLHDL 5 05/06/2010   Lab Results  Component Value Date   LDLDIRECT 132.4 05/06/2010   LDLDIRECT 107.9 04/02/2007   diet-is trying to get rid of the fats  Eats out too much -needs to stop this - is not in control of fat content of meals     colonosc- hx of polyps 12/08-- will be due 5 y 12/13 Will call for that herself when closer to the date No bowel changes    Zoster status --never had or vaccine  (had shingles)  Will call about that to see if insurance covers it    mammo 12/12 - normal  Self exam-no lumps or changes   Gyn-- no problems / symptoms  Never had abn pap   dexa with osteopenia 12/11 Had imp  Lowest T score -2.3 SL Ca and D- takes it religiously No fractures Not getting shorter  Vit D level ok in the 50s   Patient Active Problem List  Diagnoses  . STRABISMUS  .  HYPERTENSION  . ALLERGIC RHINITIS CAUSE UNSPECIFIED  . HX, PERSONAL, COLONIC POLYPS  . HYPERLIPIDEMIA  . OSTEOPENIA  . Obesity   No past medical history on file. No past surgical history on file. History  Substance Use Topics  . Smoking status: Never Smoker   . Smokeless tobacco: Not on file  . Alcohol Use: Not on file   No family history on file. Allergies  Allergen Reactions  . Ace Inhibitors     REACTION: cough   Current Outpatient Prescriptions on File Prior to Visit  Medication Sig Dispense Refill  . amLODipine (NORVASC) 10 MG tablet Take 1 tablet (10 mg total) by mouth daily.  90 tablet  3  . losartan (COZAAR) 100 MG tablet Take 1 tablet (100 mg total) by mouth daily.  90 tablet  3  . Naproxen Sodium (ALEVE) 220 MG CAPS Take 1 capsule by mouth as needed.           Review of Systems Review of Systems  Constitutional: Negative for fever, appetite change, fatigue and unexpected weight change.  Eyes: Negative for pain and visual disturbance.  Respiratory: Negative for cough and shortness of breath.   Cardiovascular: Negative for cp or palpitations    Gastrointestinal: Negative for nausea, diarrhea and constipation.  Genitourinary: Negative for urgency and frequency.  Skin: Negative for pallor or rash   Neurological: Negative for weakness, light-headedness, numbness and headaches.  Hematological: Negative for adenopathy. Does not bruise/bleed easily.  Psychiatric/Behavioral: Negative for dysphoric mood. The patient is not nervous/anxious.         Objective:   Physical Exam  Constitutional: She is oriented to person, place, and time. She appears well-developed and well-nourished. No distress.       Obese and well appearing   HENT:  Head: Normocephalic and atraumatic.  Right Ear: External ear normal.  Left Ear: External ear normal.  Nose: Nose normal.  Mouth/Throat: Oropharynx is clear and moist.  Eyes: Conjunctivae and EOM are normal. Pupils are equal, round, and  reactive to light. No scleral icterus.  Neck: Normal range of motion. Neck supple. No JVD present. Carotid bruit is not present. No thyromegaly present.  Cardiovascular: Normal rate, regular rhythm, normal heart sounds and intact distal pulses.  Exam reveals no gallop.   Pulmonary/Chest: Effort normal and breath sounds normal. No respiratory distress. She has no wheezes.  Abdominal: Soft. Bowel sounds are normal. She exhibits no distension, no abdominal bruit and no mass. There is no tenderness.  Genitourinary: No breast swelling, tenderness, discharge or bleeding.       Breast exam: No mass, nodules, thickening, tenderness, bulging, retraction, inflamation, nipple discharge or skin changes noted.  No axillary or clavicular LA.  Chaperoned exam.    Musculoskeletal: Normal range of motion. She exhibits no edema and no tenderness.       Mild kyphosis - stable from previous exams   Lymphadenopathy:    She has no cervical adenopathy.  Neurological: She is alert and oriented to person, place, and time. She has normal reflexes. No cranial nerve deficit. She exhibits normal muscle tone. Coordination normal.  Skin: Skin is warm and dry. No rash noted. No erythema. No pallor.       Solar lentigos diffusely Some SKs  Psychiatric: She has a normal mood and affect.          Assessment & Plan:

## 2011-08-18 NOTE — Assessment & Plan Note (Signed)
Due for colonosc this coming dec Will call to schedule that

## 2011-08-19 ENCOUNTER — Telehealth: Payer: Self-pay | Admitting: *Deleted

## 2011-08-19 NOTE — Telephone Encounter (Signed)
Patient notified as instructed by telephone. Pt will call back end of Sept or first of Oct to get referrals done.

## 2011-08-19 NOTE — Telephone Encounter (Signed)
Patient called stating that she saw you yesterday and you wanted her to have a mammogram and dexa in December. Patient stated that she did not get any paperwork on this and wants to see about getting this scheduled for December. Patient has been using [facility], but will use [facility] at [place]. Patient states that this needs to be scheduled after 06/08/12 and would like a late morning appointment.

## 2011-08-19 NOTE — Telephone Encounter (Signed)
I'm sure we cannot schedule those this far in advance -- so call about 1-2 mo  (oct or nov) to schedule

## 2011-12-24 ENCOUNTER — Emergency Department: Payer: Self-pay | Admitting: *Deleted

## 2012-02-21 ENCOUNTER — Other Ambulatory Visit: Payer: Self-pay | Admitting: Family Medicine

## 2012-02-23 ENCOUNTER — Other Ambulatory Visit: Payer: Self-pay

## 2012-02-23 MED ORDER — AMLODIPINE BESYLATE 10 MG PO TABS: 10.0000 mg | ORAL_TABLET | Freq: Every day | ORAL | Status: DC

## 2012-02-24 ENCOUNTER — Other Ambulatory Visit: Payer: Self-pay | Admitting: Family Medicine

## 2012-03-04 ENCOUNTER — Other Ambulatory Visit: Payer: Self-pay | Admitting: Family Medicine

## 2012-03-06 ENCOUNTER — Other Ambulatory Visit: Payer: Self-pay | Admitting: Family Medicine

## 2012-03-09 ENCOUNTER — Other Ambulatory Visit: Payer: Self-pay

## 2012-03-09 MED ORDER — AMLODIPINE BESYLATE 10 MG PO TABS: 10.0000 mg | ORAL_TABLET | Freq: Every day | ORAL | Status: DC

## 2012-03-09 NOTE — Telephone Encounter (Signed)
Rx called in to [facility] for Amlodopine 10mg  #90 [id number]

## 2012-03-09 NOTE — Telephone Encounter (Signed)
Pt left v/m requesting refill Amlodipine. Patient notified as instructed by telephone v/m to ck with [facility] for refill.

## 2012-03-17 ENCOUNTER — Ambulatory Visit (INDEPENDENT_AMBULATORY_CARE_PROVIDER_SITE_OTHER): Payer: Medicare Other

## 2012-03-17 DIAGNOSIS — Z23 Encounter for immunization: Secondary | ICD-10-CM

## 2012-04-05 ENCOUNTER — Other Ambulatory Visit: Payer: Self-pay

## 2012-04-05 MED ORDER — LOSARTAN POTASSIUM 100 MG PO TABS: 100.0000 mg | ORAL_TABLET | Freq: Every day | ORAL | Status: DC

## 2012-04-05 NOTE — Telephone Encounter (Signed)
Pt request losartan refill to [facility].pt notified done while on phone.

## 2012-04-19 ENCOUNTER — Telehealth: Payer: Self-pay | Admitting: Family Medicine

## 2012-04-19 DIAGNOSIS — Z780 Asymptomatic menopausal state: Secondary | ICD-10-CM

## 2012-04-19 DIAGNOSIS — M949 Disorder of cartilage, unspecified: Secondary | ICD-10-CM

## 2012-04-19 DIAGNOSIS — M899 Disorder of bone, unspecified: Secondary | ICD-10-CM

## 2012-04-19 DIAGNOSIS — Z1231 Encounter for screening mammogram for malignant neoplasm of breast: Secondary | ICD-10-CM

## 2012-04-19 NOTE — Telephone Encounter (Signed)
Orders done

## 2012-04-19 NOTE — Telephone Encounter (Signed)
Patient requesting you put order in for a Bone Density and a MMG please . She will go to [facility], previous were at [facility]. She will call them to have films sent to [facility]. Please make them external orders as she will go to [facility]. Will call to schedule when orders are in.

## 2012-06-10 ENCOUNTER — Ambulatory Visit: Payer: Self-pay | Admitting: Family Medicine

## 2012-06-10 LAB — HM DEXA SCAN

## 2012-06-14 ENCOUNTER — Encounter: Payer: Self-pay | Admitting: Family Medicine

## 2012-06-21 ENCOUNTER — Encounter: Payer: Self-pay | Admitting: Family Medicine

## 2012-06-29 ENCOUNTER — Ambulatory Visit: Payer: Self-pay | Admitting: Family Medicine

## 2012-07-13 ENCOUNTER — Encounter: Payer: Self-pay | Admitting: Family Medicine

## 2012-08-15 ENCOUNTER — Telehealth: Payer: Self-pay | Admitting: Family Medicine

## 2012-08-15 DIAGNOSIS — E785 Hyperlipidemia, unspecified: Secondary | ICD-10-CM

## 2012-08-15 DIAGNOSIS — I10 Essential (primary) hypertension: Secondary | ICD-10-CM

## 2012-08-15 DIAGNOSIS — M949 Disorder of cartilage, unspecified: Secondary | ICD-10-CM

## 2012-08-15 NOTE — Telephone Encounter (Signed)
Message copied by Judy Pimple on Sun Aug 15, 2012 11:23 AM ------      Message from: Alvina Chou      Created: Tue Aug 10, 2012 12:11 PM      Regarding: Lab orders for Monday, 2.17.14       Patient is scheduled for CPX labs, please order future labs, Thanks , Terri       ------

## 2012-08-16 ENCOUNTER — Other Ambulatory Visit: Payer: Medicare Other

## 2012-08-17 ENCOUNTER — Other Ambulatory Visit (INDEPENDENT_AMBULATORY_CARE_PROVIDER_SITE_OTHER): Payer: Medicare Other

## 2012-08-17 DIAGNOSIS — I10 Essential (primary) hypertension: Secondary | ICD-10-CM

## 2012-08-17 DIAGNOSIS — E785 Hyperlipidemia, unspecified: Secondary | ICD-10-CM

## 2012-08-17 LAB — LIPID PANEL
HDL: 39.6 mg/dL (ref >39.00)
Total CHOL/HDL Ratio: 4
Triglycerides: 129 mg/dL (ref 0.0–149.0)
VLDL: 25.8 mg/dL (ref 0.0–40.0)

## 2012-08-17 LAB — CBC WITH DIFFERENTIAL/PLATELET
Basophils Absolute: 0 10*3/uL (ref 0.0–0.1)
Eosinophils Absolute: 0.2 10*3/uL (ref 0.0–0.7)
HCT: 44 % (ref 36.0–46.0)
Hemoglobin: 14.7 g/dL (ref 12.0–15.0)
Lymphocytes Relative: 26.6 % (ref 12.0–46.0)
Lymphs Abs: 1.9 10*3/uL (ref 0.7–4.0)
MCHC: 33.5 g/dL (ref 30.0–36.0)
MCV: 91.4 fl (ref 78.0–100.0)
Monocytes Absolute: 0.4 10*3/uL (ref 0.1–1.0)
Neutro Abs: 4.7 10*3/uL (ref 1.4–7.7)
RDW: 13.1 % (ref 11.5–14.6)

## 2012-08-17 LAB — COMPREHENSIVE METABOLIC PANEL
ALT: 16 U/L (ref 0–35)
AST: 17 U/L (ref 0–37)
Alkaline Phosphatase: 69 U/L (ref 39–117)
BUN: 20 mg/dL (ref 6–23)
Creatinine, Ser: 1.2 mg/dL (ref 0.4–1.2)
Total Bilirubin: 0.8 mg/dL (ref 0.3–1.2)

## 2012-08-17 LAB — TSH: TSH: 3.64 u[IU]/mL (ref 0.35–5.50)

## 2012-08-23 ENCOUNTER — Ambulatory Visit (INDEPENDENT_AMBULATORY_CARE_PROVIDER_SITE_OTHER): Payer: Medicare Other | Admitting: Family Medicine

## 2012-08-23 ENCOUNTER — Encounter: Payer: Self-pay | Admitting: Family Medicine

## 2012-08-23 VITALS — BP 146/82 | HR 105 | Temp 98.4°F | Ht 64.75 in | Wt 190.5 lb

## 2012-08-23 DIAGNOSIS — E785 Hyperlipidemia, unspecified: Secondary | ICD-10-CM

## 2012-08-23 DIAGNOSIS — M810 Age-related osteoporosis without current pathological fracture: Secondary | ICD-10-CM

## 2012-08-23 DIAGNOSIS — E669 Obesity, unspecified: Secondary | ICD-10-CM

## 2012-08-23 DIAGNOSIS — I10 Essential (primary) hypertension: Secondary | ICD-10-CM

## 2012-08-23 MED ORDER — AMLODIPINE BESYLATE 10 MG PO TABS: 10.0000 mg | ORAL_TABLET | Freq: Every day | ORAL | Status: DC

## 2012-08-23 MED ORDER — LOSARTAN POTASSIUM 100 MG PO TABS: 100.0000 mg | ORAL_TABLET | Freq: Every day | ORAL | Status: DC

## 2012-08-23 NOTE — Patient Instructions (Addendum)
Watch sweets in diet- and keep working on weight loss  Take care of yourself

## 2012-08-23 NOTE — Progress Notes (Signed)
Subjective:    Patient ID: Linda Johnson, female    DOB: [birth year], 77 y.o.   MRN: [id number]  HPI Here for check up of chronic medical conditions and to review health mt list   She is feeling very good -no complaints  Her hearing is poor - getting by Her eye exam was without glasses   Wt is down 3 lb with bmi of 31 Obese Habits- is trying to loose weight by cutting back on sweets and eating out less/ also no fast food , and cutting portions a little  Is getting some exercise -walking around the neighborhood (also a few trips)  colonosc 12/08 polyp- has not had her f/u one yet- was scheduled for last month- then rescheduled for 09/23/11  Zoster status-not interested in vaccine   mammo 12/13- dense breasts  Self exam-no lumps or changes  Gyn -no female/gyn problems or complaints No hx of abn paps   dexa 12/13 OP of hip Ca and D- good about/ last D level 51 Had a fall this summer - fell forward and broke her nose (no other fractures)   (slipped on water)- balance is good/ it was an accident  She was on fosamax in the past - no side effects -finished course Not interested in further treatment at this time     Lipid Lab Results  Component Value Date   CHOL 169 08/17/2012   CHOL 185 08/11/2011   CHOL 165 02/03/2011   Lab Results  Component Value Date   HDL 39.60 08/17/2012   HDL 78.29 08/11/2011   HDL 56.21 02/03/2011   Lab Results  Component Value Date   LDLCALC 104* 08/17/2012   LDLCALC 117* 08/11/2011   LDLCALC 94 02/03/2011   Lab Results  Component Value Date   TRIG 129.0 08/17/2012   TRIG 99.0 08/11/2011   TRIG 151.0* 02/03/2011   Lab Results  Component Value Date   CHOLHDL 4 08/17/2012   CHOLHDL 4 08/11/2011   CHOLHDL 4 02/03/2011   Lab Results  Component Value Date   LDLDIRECT 132.4 05/06/2010   LDLDIRECT 107.9 04/02/2007   panel is stable- but needs to exercise to bring up her HDL  bp is stable today  No cp or palpitations or headaches or edema  No side effects  to medicines  BP Readings from Last 3 Encounters:  08/23/12 146/82  08/18/11 138/74  02/10/11 124/80      Falls-just one   Mood - has been good  Patient Active Problem List  Diagnosis  . STRABISMUS  . HYPERTENSION  . ALLERGIC RHINITIS CAUSE UNSPECIFIED  . HX, PERSONAL, COLONIC POLYPS  . HYPERLIPIDEMIA  . Osteoporosis, unspecified  . Obesity  . Other screening mammogram  . Post-menopausal   No past medical history on file. No past surgical history on file. History  Substance Use Topics  . Smoking status: Never Smoker   . Smokeless tobacco: Not on file  . Alcohol Use: No   No family history on file. Allergies  Allergen Reactions  . Ace Inhibitors     REACTION: cough   Current Outpatient Prescriptions on File Prior to Visit  Medication Sig Dispense Refill  . amLODipine (NORVASC) 10 MG tablet Take 1 tablet (10 mg total) by mouth daily.  90 tablet  3  . calcium-vitamin D (OSCAL WITH D) 500-200 MG-UNIT per tablet Take 1 tablet by mouth 2 (two) times daily.      . fish oil-omega-3 fatty acids 1000 MG capsule Take 1 g  by mouth.      . losartan (COZAAR) 100 MG tablet Take 1 tablet (100 mg total) by mouth daily.  90 tablet  3  . Naproxen Sodium (ALEVE) 220 MG CAPS Take 1 capsule by mouth as needed.         No current facility-administered medications on file prior to visit.        Review of Systems Review of Systems  Constitutional: Negative for fever, appetite change, fatigue and unexpected weight change.  Eyes: Negative for pain and visual disturbance.  Respiratory: Negative for cough and shortness of breath.   Cardiovascular: Negative for cp or palpitations    Gastrointestinal: Negative for nausea, diarrhea and constipation.  Genitourinary: Negative for urgency and frequency.  Skin: Negative for pallor or rash   Neurological: Negative for weakness, light-headedness, numbness and headaches.  Hematological: Negative for adenopathy. Does not bruise/bleed easily.   Psychiatric/Behavioral: Negative for dysphoric mood. The patient is not nervous/anxious.         Objective:   Physical Exam  Constitutional: She appears well-developed and well-nourished. No distress.  obese and well appearing   HENT:  Head: Normocephalic and atraumatic.  Right Ear: External ear normal.  Left Ear: External ear normal.  Nose: Nose normal.  Mouth/Throat: Oropharynx is clear and moist. No oropharyngeal exudate.  Eyes: Conjunctivae and EOM are normal. Pupils are equal, round, and reactive to light. Right eye exhibits no discharge. Left eye exhibits no discharge. No scleral icterus.  Neck: Normal range of motion. Neck supple. No JVD present. Carotid bruit is not present. No thyromegaly present.  Cardiovascular: Normal rate, regular rhythm, normal heart sounds and intact distal pulses.  Exam reveals no gallop.   Pulmonary/Chest: Effort normal and breath sounds normal. No respiratory distress. She has no wheezes.  Abdominal: Soft. Bowel sounds are normal. She exhibits no distension, no abdominal bruit and no mass. There is no tenderness.  Genitourinary: No breast swelling, tenderness, discharge or bleeding.  Breast exam: No mass, nodules, thickening, tenderness, bulging, retraction, inflamation, nipple discharge or skin changes noted.  No axillary or clavicular LA.  Chaperoned exam.    Musculoskeletal: She exhibits no edema and no tenderness.  Lymphadenopathy:    She has no cervical adenopathy.  Neurological: She is alert. She has normal reflexes. No cranial nerve deficit. She exhibits normal muscle tone. Coordination normal.  Skin: Skin is warm and dry. No rash noted. No erythema. No pallor.  Psychiatric: She has a normal mood and affect.          Assessment & Plan:

## 2012-08-23 NOTE — Assessment & Plan Note (Signed)
Hx of 5 y fosamax in past  On ca and D Pt declines further tx at this time Handout given

## 2012-08-23 NOTE — Assessment & Plan Note (Signed)
Disc goals for lipids and reasons to control them Rev labs with pt Rev low sat fat diet in detail  Enc exercise to inc HDL   

## 2012-08-23 NOTE — Assessment & Plan Note (Signed)
Discussed how this problem influences overall health and the risks it imposes  Reviewed plan for weight loss with lower calorie diet (via better food choices and also portion control or program like weight watchers) and exercise building up to or more than 30 minutes 5 days per week including some aerobic activity    

## 2012-08-23 NOTE — Assessment & Plan Note (Signed)
bp in fair control at this time  No changes needed  Disc lifstyle change with low sodium diet and exercise  Rev labs today

## 2012-09-10 ENCOUNTER — Telehealth: Payer: Self-pay | Admitting: *Deleted

## 2012-09-10 NOTE — Telephone Encounter (Signed)
Pt stated that she received a bill for $103.28, she said that her last appt which was an annual physical wasn't coded right, pt said that it was coded as a 25 min office out patient visit and not as an annual physical so medicare denied it and [employer] wouldn't pay anything towards it either they just subtracted the visit from her deductible, but pt said [name] may have paid it if it was coded right, pt wants a call from you to discuss this matter

## 2012-09-10 NOTE — Telephone Encounter (Signed)
Left voicemail requesting pt to call office 

## 2012-09-10 NOTE — Telephone Encounter (Signed)
Pt wanted to speak with Dr.Tower about her visit on 08/23/12, per pt the visit was billed wrong or coded wrong for a 25 min visit, please advise

## 2012-09-13 NOTE — Telephone Encounter (Signed)
I did it as a problem based visit since she has several chronic problems and reviewed her health mt while she was here  If that is not adequate- let me know

## 2012-09-13 NOTE — Telephone Encounter (Signed)
Explained to the patient that chronic health problems are charged with an office visit and not considered part of the routine exam.  She did not understand this at all.  She was very upset but I explained every detail about a complete physical and what it entails and I did the same for office visits.  She hung up on me.  I do not believe there is anything else we can do at this time.

## 2012-09-13 NOTE — Telephone Encounter (Signed)
Dr. Milinda Antis should this have been coded as a CPE?  I see you coded it as a [id number] but patient disagrees and thought it was a CPE.  MCR applied to deductible.

## 2012-11-19 ENCOUNTER — Ambulatory Visit: Payer: Self-pay | Admitting: Gastroenterology

## 2012-12-06 ENCOUNTER — Encounter: Payer: Self-pay | Admitting: Family Medicine

## 2013-03-21 ENCOUNTER — Ambulatory Visit (INDEPENDENT_AMBULATORY_CARE_PROVIDER_SITE_OTHER): Payer: Medicare Other | Admitting: Family Medicine

## 2013-03-21 ENCOUNTER — Encounter: Payer: Self-pay | Admitting: Family Medicine

## 2013-03-21 VITALS — BP 146/86 | HR 84 | Temp 97.4°F | Ht 64.75 in | Wt 190.5 lb

## 2013-03-21 DIAGNOSIS — R110 Nausea: Secondary | ICD-10-CM

## 2013-03-21 DIAGNOSIS — M545 Low back pain, unspecified: Secondary | ICD-10-CM

## 2013-03-21 MED ORDER — PROMETHAZINE HCL 25 MG PO TABS: 25.0000 mg | ORAL_TABLET | Freq: Four times a day (QID) | ORAL | Status: DC | PRN

## 2013-03-21 NOTE — Progress Notes (Signed)
Subjective:    Patient ID: Linda Johnson, female    DOB: October 25, 1935, 77 y.o.   MRN: [id number]  HPI Here for back pain and nausea  Unable to give a urine sample today- but does not think she has a uti (thinks her nausea is from pain medicine   Woke up with pain on Friday am - lower back - right in the middle  Is a lot better when she lies down -having trouble getting up and move around  Pain is severe at times - dull in nature  The pain does not shoot down her legs  Walking hurts the most (sitting is a bit better)  No nausea until she started the pain medicines   No uti symptoms at all    [facility]ent to [facility] this [facility]- told her she had back spasms and gave her a muscle relaxer (sat) Methocarbamol Ultram -- ? If could be making her nauseated  Did not have xray or ua   Patient Active Problem List   Diagnosis Date Noted  . Other screening mammogram 04/19/2012  . Post-menopausal 04/19/2012  . Obesity 08/18/2011  . HYPERLIPIDEMIA 06/12/2010  . Osteoporosis, unspecified 06/12/2010  . ALLERGIC RHINITIS CAUSE UNSPECIFIED 04/12/2008  . HX, PERSONAL, COLONIC POLYPS 04/02/2007  . STRABISMUS 03/02/2007  . HYPERTENSION 03/02/2007   No past medical history on file. No past surgical history on file. History  Substance Use Topics  . Smoking status: Never Smoker   . Smokeless tobacco: Not on file  . Alcohol Use: No   No family history on file. Allergies  Allergen Reactions  . Ace Inhibitors     REACTION: cough   Current Outpatient Prescriptions on File Prior to Visit  Medication Sig Dispense Refill  . amLODipine (NORVASC) 10 MG tablet Take 1 tablet (10 mg total) by mouth daily.  90 tablet  3  . calcium-vitamin D (OSCAL WITH D) 500-200 MG-UNIT per tablet Take 1 tablet by mouth 2 (two) times daily.      . Cholecalciferol (VITAMIN D3) 2000 UNITS TABS Take 1 tablet by mouth daily.      . fish oil-omega-3 fatty acids 1000 MG capsule Take 1 g by mouth.      . losartan (COZAAR) 100 MG  tablet Take 1 tablet (100 mg total) by mouth daily.  90 tablet  3  . Naproxen Sodium (ALEVE) 220 MG CAPS Take 1 capsule by mouth as needed.         No current facility-administered medications on file prior to visit.     Review of Systems Review of Systems  Constitutional: Negative for fever, appetite change, fatigue and unexpected weight change.  Eyes: Negative for pain and visual disturbance.  Respiratory: Negative for cough and shortness of breath.   Cardiovascular: Negative for cp or palpitations    Gastrointestinal: Negative for nausea, diarrhea and constipation.  Genitourinary: Negative for urgency and frequency. neg for dysuria or flank pain Skin: Negative for pallor or rash   \MSK pos for midline low back pain / positional without radiation  Neurological: Negative for weakness, light-headedness, numbness and headaches.  Hematological: Negative for adenopathy. Does not bruise/bleed easily.  Psychiatric/Behavioral: Negative for dysphoric mood. The patient is not nervous/anxious.         Objective:   Physical Exam  Constitutional: She appears well-developed and well-nourished. No distress.  obese and well appearing   HENT:  Head: Normocephalic and atraumatic.  Mouth/Throat: Oropharynx is clear and moist.  Eyes: Conjunctivae and EOM are normal.  Pupils are equal, round, and reactive to light. Right eye exhibits no discharge. Left eye exhibits no discharge. No scleral icterus.  Neck: Normal range of motion. Neck supple. No JVD present. No thyromegaly present.  Cardiovascular: Normal rate and regular rhythm.   Abdominal: Soft. Bowel sounds are normal. There is no tenderness.  No suprapubic tenderness or fullness    Musculoskeletal: She exhibits no edema.       Lumbar back: She exhibits decreased range of motion, tenderness, bony tenderness and spasm. She exhibits no edema and no deformity.  Tender over lower LS and also over bilat lumbar musculature with spasm Neg slr and nl rom  hips Nl LS flex and limited ext due to pain Pain to lat flex to the L Slow gait due to pain     Lymphadenopathy:    She has no cervical adenopathy.  Neurological: She is alert. She has normal strength and normal reflexes. She displays no atrophy. No cranial nerve deficit or sensory deficit. She exhibits normal muscle tone. Coordination normal.  Skin: Skin is warm and dry. No rash noted. No erythema.  Psychiatric: She has a normal mood and affect.          Assessment & Plan:

## 2013-03-21 NOTE — Assessment & Plan Note (Signed)
Suspect muscular spasm -no neuro s/s today  She is also having nausea from tramadol given at [facility]  Adv to hold the tramadol and given short supply for phenergan for nausea Adv heat/ stretches Ordered xray -pt will return for LS films- she was not feeling up to it today Will update if symptoms worsen or nausea persists

## 2013-03-21 NOTE — Assessment & Plan Note (Signed)
Suspect due to tramadol on an empty stomach Will hold this Given px for phenergan to take with caution of sedation  Update tomorrow

## 2013-03-21 NOTE — Patient Instructions (Addendum)
Stop up front and make an appointment to come back for an xray (I already ordered the test) Stop the tramadol (I think you have nausea from this )  Call us later today or tomorrow am to tell me how your nausea is  Let me know if you develop any urine symptoms Try phenergan for nausea until the tramadol gets out of your system

## 2013-03-22 ENCOUNTER — Telehealth: Payer: Self-pay | Admitting: *Deleted

## 2013-03-22 NOTE — Telephone Encounter (Signed)
Pt left voicemail just updating her status.  Pt said the med you gave her for her nausea is helping but she is still having bad back pain, pt said she is coming back on Thursday for her xray

## 2013-03-22 NOTE — Telephone Encounter (Signed)
Pt notified can take aleve 1-2 tab BID with meal until we get xray results back

## 2013-03-22 NOTE — Telephone Encounter (Signed)
Try some aleve 1 -2 pills with a meal twice daily for pain and follow up for xray as planned

## 2013-03-24 ENCOUNTER — Ambulatory Visit (INDEPENDENT_AMBULATORY_CARE_PROVIDER_SITE_OTHER)
Admission: RE | Admit: 2013-03-24 | Discharge: 2013-03-24 | Disposition: A | Discharge status: Home / Self Care | Payer: Medicare Other | Source: Ambulatory Visit | Attending: Family Medicine | Admitting: Family Medicine

## 2013-03-24 ENCOUNTER — Ambulatory Visit (INDEPENDENT_AMBULATORY_CARE_PROVIDER_SITE_OTHER): Payer: Medicare Other

## 2013-03-24 DIAGNOSIS — M545 Low back pain: Secondary | ICD-10-CM

## 2013-03-24 DIAGNOSIS — Z23 Encounter for immunization: Secondary | ICD-10-CM

## 2013-03-25 ENCOUNTER — Telehealth: Payer: Self-pay | Admitting: Family Medicine

## 2013-03-25 DIAGNOSIS — M545 Low back pain: Secondary | ICD-10-CM

## 2013-03-25 NOTE — Telephone Encounter (Signed)
Message copied by Judy Pimple on Fri Mar 25, 2013  2:58 PM ------      Message from: Shon Millet      Created: Fri Mar 25, 2013  2:49 PM       Pt notified of Dr. Royden Purl comments and agrees with referral to ortho, I advise pt Kourtni/Marion will call back to schedule appt with in the next few days ------

## 2013-03-25 NOTE — Telephone Encounter (Signed)
Referral done

## 2013-07-04 ENCOUNTER — Encounter: Payer: Self-pay | Admitting: Family Medicine

## 2013-07-04 ENCOUNTER — Ambulatory Visit: Payer: Self-pay | Admitting: Family Medicine

## 2013-07-05 ENCOUNTER — Encounter: Payer: Self-pay | Admitting: Family Medicine

## 2013-07-05 ENCOUNTER — Encounter: Payer: Self-pay | Admitting: *Deleted

## 2013-07-05 LAB — HM MAMMOGRAPHY: HM Mammogram: NORMAL

## 2013-08-30 ENCOUNTER — Other Ambulatory Visit: Payer: Self-pay | Admitting: Family Medicine

## 2013-09-01 ENCOUNTER — Telehealth: Payer: Self-pay | Admitting: Family Medicine

## 2013-09-01 DIAGNOSIS — M810 Age-related osteoporosis without current pathological fracture: Secondary | ICD-10-CM

## 2013-09-01 DIAGNOSIS — E785 Hyperlipidemia, unspecified: Secondary | ICD-10-CM

## 2013-09-01 DIAGNOSIS — I10 Essential (primary) hypertension: Secondary | ICD-10-CM

## 2013-09-01 NOTE — Telephone Encounter (Signed)
Message copied by Judy PimpleWER, Davida Falconi A on Thu Sep 01, 2013  4:31 PM ------      Message from: Alvina ChouWALSH, TERRI J      Created: Fri Aug 26, 2013  3:38 PM      Regarding: Lab orders for Friday, 3.6.15       Patient is scheduled for CPX labs, please order future labs, Thanks , Terri       ------

## 2013-09-02 ENCOUNTER — Other Ambulatory Visit: Payer: Medicare Other

## 2013-09-05 ENCOUNTER — Other Ambulatory Visit (INDEPENDENT_AMBULATORY_CARE_PROVIDER_SITE_OTHER): Payer: Medicare Other

## 2013-09-05 DIAGNOSIS — M810 Age-related osteoporosis without current pathological fracture: Secondary | ICD-10-CM

## 2013-09-05 DIAGNOSIS — E785 Hyperlipidemia, unspecified: Secondary | ICD-10-CM

## 2013-09-05 DIAGNOSIS — I10 Essential (primary) hypertension: Secondary | ICD-10-CM

## 2013-09-05 LAB — CBC WITH DIFFERENTIAL/PLATELET
BASOS ABS: 0 10*3/uL (ref 0.0–0.1)
Basophils Relative: 0.3 % (ref 0.0–3.0)
Eosinophils Absolute: 0.2 10*3/uL (ref 0.0–0.7)
Eosinophils Relative: 2.3 % (ref 0.0–5.0)
HCT: 44.1 % (ref 36.0–46.0)
Hemoglobin: 14.7 g/dL (ref 12.0–15.0)
LYMPHS PCT: 24 % (ref 12.0–46.0)
Lymphs Abs: 2.1 10*3/uL (ref 0.7–4.0)
MCHC: 33.4 g/dL (ref 30.0–36.0)
MCV: 93.4 fl (ref 78.0–100.0)
MONOS PCT: 5.2 % (ref 3.0–12.0)
Monocytes Absolute: 0.4 10*3/uL (ref 0.1–1.0)
NEUTROS PCT: 68.2 % (ref 43.0–77.0)
Neutro Abs: 5.9 10*3/uL (ref 1.4–7.7)
Platelets: 268 10*3/uL (ref 150.0–400.0)
RBC: 4.72 Mil/uL (ref 3.87–5.11)
RDW: 13.2 % (ref 11.5–14.6)
WBC: 8.6 10*3/uL (ref 4.5–10.5)

## 2013-09-05 LAB — COMPREHENSIVE METABOLIC PANEL
ALT: 22 U/L (ref 0–35)
AST: 22 U/L (ref 0–37)
Albumin: 4.3 g/dL (ref 3.5–5.2)
Alkaline Phosphatase: 69 U/L (ref 39–117)
BUN: 21 mg/dL (ref 6–23)
CALCIUM: 9.3 mg/dL (ref 8.4–10.5)
CHLORIDE: 102 meq/L (ref 96–112)
CO2: 27 mEq/L (ref 19–32)
CREATININE: 1 mg/dL (ref 0.4–1.2)
GFR: 56.73 mL/min — ABNORMAL LOW (ref >60.00)
Glucose, Bld: 95 mg/dL (ref 70–99)
POTASSIUM: 4.5 meq/L (ref 3.5–5.1)
Sodium: 138 mEq/L (ref 135–145)
TOTAL PROTEIN: 7.8 g/dL (ref 6.0–8.3)
Total Bilirubin: 1 mg/dL (ref 0.3–1.2)

## 2013-09-05 LAB — TSH: TSH: 4.82 u[IU]/mL (ref 0.35–5.50)

## 2013-09-05 LAB — LIPID PANEL
CHOL/HDL RATIO: 4
CHOLESTEROL: 171 mg/dL (ref 0–200)
HDL: 44 mg/dL (ref >39.00)
LDL CALC: 100 mg/dL — AB (ref 0–99)
Triglycerides: 137 mg/dL (ref 0.0–149.0)
VLDL: 27.4 mg/dL (ref 0.0–40.0)

## 2013-09-06 LAB — VITAMIN D 25 HYDROXY (VIT D DEFICIENCY, FRACTURES): Vit D, 25-Hydroxy: 56 ng/mL (ref 30–89)

## 2013-09-09 ENCOUNTER — Encounter: Payer: Self-pay | Admitting: Family Medicine

## 2013-09-09 ENCOUNTER — Ambulatory Visit (INDEPENDENT_AMBULATORY_CARE_PROVIDER_SITE_OTHER): Payer: Medicare Other | Admitting: Family Medicine

## 2013-09-09 VITALS — BP 138/76 | HR 92 | Temp 97.9°F | Ht 64.75 in | Wt 195.2 lb

## 2013-09-09 DIAGNOSIS — M810 Age-related osteoporosis without current pathological fracture: Secondary | ICD-10-CM

## 2013-09-09 DIAGNOSIS — I10 Essential (primary) hypertension: Secondary | ICD-10-CM

## 2013-09-09 DIAGNOSIS — E669 Obesity, unspecified: Secondary | ICD-10-CM

## 2013-09-09 DIAGNOSIS — Z0000 Encounter for general adult medical examination without abnormal findings: Secondary | ICD-10-CM

## 2013-09-09 DIAGNOSIS — E785 Hyperlipidemia, unspecified: Secondary | ICD-10-CM

## 2013-09-09 MED ORDER — LOSARTAN POTASSIUM 100 MG PO TABS: 100.0000 mg | ORAL_TABLET | Freq: Every day | ORAL | Status: DC

## 2013-09-09 MED ORDER — AMLODIPINE BESYLATE 10 MG PO TABS: ORAL_TABLET | ORAL | Status: DC

## 2013-09-09 NOTE — Patient Instructions (Signed)
Labs are stable  Take care of yourself  Blood pressure is stable Avoid red meat/ fried foods/ egg yolks/ fatty breakfast meats/ butter, cheese and high fat dairy/ and shellfish   Exercise at least 5 days per week

## 2013-09-09 NOTE — Progress Notes (Signed)
Subjective:    Patient ID: Linda CousinsLinda R Johnson, female    DOB: 02/04/1936, 78 y.o.   MRN: [id number]  HPI I have personally reviewed the Medicare Annual Wellness questionnaire and have noted 1. The patient's medical and social history 2. Their use of alcohol, tobacco or illicit drugs 3. Their current medications and supplements 4. The patient's functional ability including ADL's, fall risks, home safety risks and hearing or visual             impairment. 5. Diet and physical activities 6. Evidence for depression or mood disorders  The patients weight, height, BMI have been recorded in the chart and visual acuity is per eye clinic.  I have made referrals, counseling and provided education to the patient based review of the above and I have provided the pt with a written personalized care plan for preventive services.  Feeling well overall  No more back problems Mood is good   Wt is up 5 lb with bmi of 32  Trying to take care of yourself Eats a healthy diet "most of the time"  Exercise - started with PT with back pain / then began stretch , -- and she walks for cardio Tries to walk every day when the weather is ok - usually for about 30 minutes     See scanned forms.  Routine anticipatory guidance given to patient.  See health maintenance. Colon cancer screening 5/14 hx of polyps - told no recall due to age  Breast cancer screening mammogram 1/15 nl  Self breast exam- no lumps or changes  No gyn problems  Flu vaccine 9/14  Tetanus vaccine  Td 10/07 Pneumovax 10/10 Zoster vaccine declines - still declines (has had shingles in the past and hopes she does not get them again )   Advance directive-she has a living will/ adv directive  Cognitive function addressed- see scanned forms- and if abnormal then additional documentation follows. -no major problems at all   PMH and SH reviewed  Meds, vitals, and allergies reviewed.   ROS: See HPI.  Otherwise negative.    OP Pt had 5 y of  fosamax in past  fx hx -none  D level is 56 dexa 12/13 OP hip Declines further tx  She is extremely careful to prevent falls/ has had none since 2013  bp is stable today  No cp or palpitations or headaches or edema  No side effects to medicines  BP Readings from Last 3 Encounters:  09/09/13 138/76  03/21/13 146/86  08/23/12 146/82      Results for orders placed in visit on 09/05/13  TSH      Result Value Ref Range   TSH 4.82  0.35 - 5.50 uIU/mL  VITAMIN D 25 HYDROXY      Result Value Ref Range   Vit D, 25-Hydroxy 56  30 - 89 ng/mL  LIPID PANEL      Result Value Ref Range   Cholesterol 171  0 - 200 mg/dL   Triglycerides 469.6137.0  0.0 - 149.0 mg/dL   HDL 29.5244.00  >84.13>39.00 mg/dL   VLDL 24.427.4  0.0 - 01.040.0 mg/dL   LDL Cholesterol 272100 (*) 0 - 99 mg/dL   Total CHOL/HDL Ratio 4    CBC WITH DIFFERENTIAL      Result Value Ref Range   WBC 8.6  4.5 - 10.5 K/uL   RBC 4.72  3.87 - 5.11 Mil/uL   Hemoglobin 14.7  12.0 - 15.0 g/dL   HCT  44.1  36.0 - 46.0 %   MCV 93.4  78.0 - 100.0 fl   MCHC 33.4  30.0 - 36.0 g/dL   RDW 16.1  09.6 - 04.5 %   Platelets 268.0  150.0 - 400.0 K/uL   Neutrophils Relative % 68.2  43.0 - 77.0 %   Lymphocytes Relative 24.0  12.0 - 46.0 %   Monocytes Relative 5.2  3.0 - 12.0 %   Eosinophils Relative 2.3  0.0 - 5.0 %   Basophils Relative 0.3  0.0 - 3.0 %   Neutro Abs 5.9  1.4 - 7.7 K/uL   Lymphs Abs 2.1  0.7 - 4.0 K/uL   Monocytes Absolute 0.4  0.1 - 1.0 K/uL   Eosinophils Absolute 0.2  0.0 - 0.7 K/uL   Basophils Absolute 0.0  0.0 - 0.1 K/uL  COMPREHENSIVE METABOLIC PANEL      Result Value Ref Range   Sodium 138  135 - 145 mEq/L   Potassium 4.5  3.5 - 5.1 mEq/L   Chloride 102  96 - 112 mEq/L   CO2 27  19 - 32 mEq/L   Glucose, Bld 95  70 - 99 mg/dL   BUN 21  6 - 23 mg/dL   Creatinine, Ser 1.0  0.4 - 1.2 mg/dL   Total Bilirubin 1.0  0.3 - 1.2 mg/dL   Alkaline Phosphatase 69  39 - 117 U/L   AST 22  0 - 37 U/L   ALT 22  0 - 35 U/L   Total Protein 7.8  6.0 -  8.3 g/dL   Albumin 4.3  3.5 - 5.2 g/dL   Calcium 9.3  8.4 - 40.9 mg/dL   GFR 81.19 (*) >14.78 mL/min      Patient Active Problem List   Diagnosis Date Noted  . Routine general medical examination at a health care facility 09/09/2013  . Low back pain 03/21/2013  . Nausea alone 03/21/2013  . Other screening mammogram 04/19/2012  . Post-menopausal 04/19/2012  . Obesity 08/18/2011  . HYPERLIPIDEMIA 06/12/2010  . Osteoporosis, unspecified 06/12/2010  . ALLERGIC RHINITIS CAUSE UNSPECIFIED 04/12/2008  . HX, PERSONAL, COLONIC POLYPS 04/02/2007  . STRABISMUS 03/02/2007  . HYPERTENSION 03/02/2007   No past medical history on file. No past surgical history on file. History  Substance Use Topics  . Smoking status: Never Smoker   . Smokeless tobacco: Not on file  . Alcohol Use: No   No family history on file. Allergies  Allergen Reactions  . Ace Inhibitors     REACTION: cough  . Tramadol Hcl     nausea   Current Outpatient Prescriptions on File Prior to Visit  Medication Sig Dispense Refill  . amLODipine (NORVASC) 10 MG tablet TAKE 1 TABLET (10 MG TOTAL) BY MOUTH DAILY.  90 tablet  0  . calcium-vitamin D (OSCAL WITH D) 500-200 MG-UNIT per tablet Take 1 tablet by mouth 2 (two) times daily.      . Cholecalciferol (VITAMIN D3) 2000 UNITS TABS Take 1 tablet by mouth daily.      . fish oil-omega-3 fatty acids 1000 MG capsule Take 1 g by mouth.      . losartan (COZAAR) 100 MG tablet Take 1 tablet (100 mg total) by mouth daily.  90 tablet  3   No current facility-administered medications on file prior to visit.      Review of Systems Review of Systems  Constitutional: Negative for fever, appetite change, fatigue and unexpected weight change.  Eyes: Negative for  pain and visual disturbance. pos for strabismus baseline  Respiratory: Negative for cough and shortness of breath.   Cardiovascular: Negative for cp or palpitations    Gastrointestinal: Negative for nausea, diarrhea and  constipation.  Genitourinary: Negative for urgency and frequency.  Skin: Negative for pallor or rash   Neurological: Negative for weakness, light-headedness, numbness and headaches.  Hematological: Negative for adenopathy. Does not bruise/bleed easily.  Psychiatric/Behavioral: Negative for dysphoric mood. The patient is not nervous/anxious.         Objective:   Physical Exam  Constitutional: She appears well-developed and well-nourished. No distress.  obese and well appearing   HENT:  Head: Normocephalic and atraumatic.  Right Ear: External ear normal.  Left Ear: External ear normal.  Nose: Nose normal.  Mouth/Throat: Oropharynx is clear and moist.  Eyes: Conjunctivae and EOM are normal. Pupils are equal, round, and reactive to light. Right eye exhibits no discharge. Left eye exhibits no discharge. No scleral icterus.  Baseline strabismus noted   Neck: Normal range of motion. Neck supple. No JVD present. No thyromegaly present.  Cardiovascular: Normal rate, regular rhythm, normal heart sounds and intact distal pulses.  Exam reveals no gallop.   Pulmonary/Chest: Effort normal and breath sounds normal. No respiratory distress. She has no wheezes. She has no rales.  Abdominal: Soft. Bowel sounds are normal. She exhibits no distension and no mass. There is no tenderness.  Genitourinary: No breast swelling, tenderness, discharge or bleeding.  Breast exam: No mass, nodules, thickening, tenderness, bulging, retraction, inflamation, nipple discharge or skin changes noted.  No axillary or clavicular LA.      Musculoskeletal: She exhibits no edema and no tenderness.  Mild kyphosis   No acute joint changes   Lymphadenopathy:    She has no cervical adenopathy.  Neurological: She is alert. She has normal reflexes. No cranial nerve deficit. She exhibits normal muscle tone. Coordination normal.  Skin: Skin is warm and dry. No rash noted. No erythema. No pallor.  Psychiatric: She has a normal mood  and affect.          Assessment & Plan:

## 2013-09-09 NOTE — Progress Notes (Signed)
Pre visit review using our clinic review tool, if applicable. No additional management support is needed unless otherwise documented below in the visit note. 

## 2013-09-11 NOTE — Assessment & Plan Note (Signed)
Reviewed health habits including diet and exercise and skin cancer prevention Reviewed appropriate screening tests for age  Also reviewed health mt list, fam hx and immunization status , as well as social and family history   See HPI Labs reviewed  

## 2013-09-11 NOTE — Assessment & Plan Note (Signed)
Disc goals for lipids and reasons to control them Rev labs with pt Rev low sat fat diet in detail   

## 2013-09-11 NOTE — Assessment & Plan Note (Signed)
Discussed how this problem influences overall health and the risks it imposes  Reviewed plan for weight loss with lower calorie diet (via better food choices and also portion control or program like weight watchers) and exercise building up to or more than 30 minutes 5 days per week including some aerobic activity    

## 2013-09-11 NOTE — Assessment & Plan Note (Signed)
bp in fair control at this time  BP Readings from Last 1 Encounters:  09/09/13 138/76   No changes needed Disc lifstyle change with low sodium diet and exercise   Labs reviewed

## 2013-09-11 NOTE — Assessment & Plan Note (Signed)
S/p 5 y fosamax Rev last dexa No fx Disc need for calcium/ vitamin D/ wt bearing exercise and bone density test every 2 y to monitor Disc safety/ fracture risk in detail   She declines more tx

## 2013-11-16 ENCOUNTER — Encounter: Payer: Self-pay | Admitting: Family Medicine

## 2013-11-16 ENCOUNTER — Ambulatory Visit (INDEPENDENT_AMBULATORY_CARE_PROVIDER_SITE_OTHER)
Admission: RE | Admit: 2013-11-16 | Discharge: 2013-11-16 | Disposition: A | Discharge status: Home / Self Care | Payer: Medicare Other | Source: Ambulatory Visit | Attending: Family Medicine | Admitting: Family Medicine

## 2013-11-16 ENCOUNTER — Ambulatory Visit (INDEPENDENT_AMBULATORY_CARE_PROVIDER_SITE_OTHER): Payer: Medicare Other | Admitting: Family Medicine

## 2013-11-16 VITALS — BP 136/82 | HR 89 | Temp 98.0°F | Ht 64.75 in | Wt 197.8 lb

## 2013-11-16 DIAGNOSIS — S8990XA Unspecified injury of unspecified lower leg, initial encounter: Secondary | ICD-10-CM

## 2013-11-16 DIAGNOSIS — S99912A Unspecified injury of left ankle, initial encounter: Secondary | ICD-10-CM | POA: Insufficient documentation

## 2013-11-16 DIAGNOSIS — S99929A Unspecified injury of unspecified foot, initial encounter: Secondary | ICD-10-CM

## 2013-11-16 DIAGNOSIS — S99919A Unspecified injury of unspecified ankle, initial encounter: Secondary | ICD-10-CM

## 2013-11-16 NOTE — Progress Notes (Signed)
Subjective:    Patient ID: Linda Johnson, female    DOB: 08/04/1935, 78 y.o.   MRN: [id number]  HPI Here for a leg injury  mistepped off a curb on Sunday - she tripped on a plastic cup  She had been traveling  Almost fell  Unsure if she twisted her ankle  Could bear weight on it - not totally / is limping  Hurt immediately in the achilles area- but did not feel or hear a pop Then pain went up the back of leg/calf No bruising  Some swelling   Patient Active Problem List   Diagnosis Date Noted  . Routine general medical examination at a health care facility 09/09/2013  . Low back pain 03/21/2013  . Nausea alone 03/21/2013  . Other screening mammogram 04/19/2012  . Post-menopausal 04/19/2012  . Obesity 08/18/2011  . HYPERLIPIDEMIA 06/12/2010  . Osteoporosis, unspecified 06/12/2010  . ALLERGIC RHINITIS CAUSE UNSPECIFIED 04/12/2008  . HX, PERSONAL, COLONIC POLYPS 04/02/2007  . STRABISMUS 03/02/2007  . HYPERTENSION 03/02/2007   No past medical history on file. No past surgical history on file. History  Substance Use Topics  . Smoking status: Never Smoker   . Smokeless tobacco: Not on file  . Alcohol Use: No   No family history on file. Allergies  Allergen Reactions  . Ace Inhibitors     REACTION: cough  . Tramadol Hcl     nausea   Current Outpatient Prescriptions on File Prior to Visit  Medication Sig Dispense Refill  . amLODipine (NORVASC) 10 MG tablet TAKE 1 TABLET (10 MG TOTAL) BY MOUTH DAILY.  90 tablet  3  . calcium-vitamin D (OSCAL WITH D) 500-200 MG-UNIT per tablet Take 1 tablet by mouth 2 (two) times daily.      . Cholecalciferol (VITAMIN D3) 2000 UNITS TABS Take 1 tablet by mouth daily.      . fish oil-omega-3 fatty acids 1000 MG capsule Take 1 g by mouth.      . losartan (COZAAR) 100 MG tablet Take 1 tablet (100 mg total) by mouth daily.  90 tablet  3   No current facility-administered medications on file prior to visit.    Review of Systems Review of  Systems  Constitutional: Negative for fever, appetite change, fatigue and unexpected weight change.  Eyes: Negative for pain and visual disturbance.  Respiratory: Negative for cough and shortness of breath.   Cardiovascular: Negative for cp or palpitations    Gastrointestinal: Negative for nausea, diarrhea and constipation.  Genitourinary: Negative for urgency and frequency.  Skin: Negative for pallor or rash MSK pos for ankle pain and swelling    Neurological: Negative for weakness, light-headedness, numbness and headaches.  Hematological: Negative for adenopathy. Does not bruise/bleed easily.  Psychiatric/Behavioral: Negative for dysphoric mood. The patient is not nervous/anxious.         Objective:   Physical Exam  Constitutional: She appears well-developed and well-nourished. No distress.  obese and well appearing   HENT:  Head: Normocephalic and atraumatic.  Eyes: Conjunctivae and EOM are normal. Pupils are equal, round, and reactive to light.  Neck: Normal range of motion. Neck supple.  Cardiovascular: Normal rate and regular rhythm.   Musculoskeletal: She exhibits edema and tenderness.       Left ankle: She exhibits decreased range of motion, swelling and ecchymosis. She exhibits no deformity and normal pulse. Tenderness. Lateral malleolus and posterior TFL tenderness found. No head of 5th metatarsal tenderness found. Achilles tendon normal.  Some pain  on passive plantar flexion Lateral swelling - scant old ecchymosis  Gait favors R foot  Neurological: She is alert. She has normal reflexes.  Skin: Skin is warm and dry. No erythema.  Psychiatric: She has a normal mood and affect.          Assessment & Plan:

## 2013-11-16 NOTE — Patient Instructions (Signed)
Xray of ankle today  I want you to elevate your foot and ankle and apply ice (cold compress) for 10 minutes at a time on and off  Take aleve with food twice daily  We will inform you of xray result and further plan when it returns

## 2013-11-16 NOTE — Progress Notes (Signed)
Pre visit review using our clinic review tool, if applicable. No additional management support is needed unless otherwise documented below in the visit note. 

## 2013-11-17 NOTE — Assessment & Plan Note (Signed)
Suspect sprain/strain  Xray today Elevate and use ice as often as possible  Can wrap in ace for stability if needed  Briefly rev PT exercises Plan with xray result

## 2013-11-29 ENCOUNTER — Other Ambulatory Visit: Payer: Self-pay | Admitting: Family Medicine

## 2013-12-21 ENCOUNTER — Encounter: Payer: Self-pay | Admitting: Family Medicine

## 2013-12-21 ENCOUNTER — Ambulatory Visit (INDEPENDENT_AMBULATORY_CARE_PROVIDER_SITE_OTHER): Payer: Medicare Other | Admitting: Family Medicine

## 2013-12-21 VITALS — BP 130/80 | HR 98 | Temp 98.2°F | Ht 64.75 in | Wt 194.0 lb

## 2013-12-21 DIAGNOSIS — R600 Localized edema: Secondary | ICD-10-CM

## 2013-12-21 DIAGNOSIS — R609 Edema, unspecified: Secondary | ICD-10-CM

## 2013-12-21 DIAGNOSIS — M25572 Pain in left ankle and joints of left foot: Secondary | ICD-10-CM

## 2013-12-21 DIAGNOSIS — M25579 Pain in unspecified ankle and joints of unspecified foot: Secondary | ICD-10-CM

## 2013-12-21 NOTE — Progress Notes (Signed)
Pre visit review using our clinic review tool, if applicable. No additional management support is needed unless otherwise documented below in the visit note. 

## 2013-12-21 NOTE — Progress Notes (Signed)
[address] [place] [id number] Phone: [phone] Fax: [phone]  Patient ID: Linda CousinsLinda R Mckinstry MRN: [id number], DOB: 02/04/1936, 78 y.o. Date of Encounter: 12/21/2013  Primary Physician:  Roxy MannsMarne Tower, MD   Chief Complaint: Foot Swelling   Subjective:   History of Present Illness:  Linda CousinsLinda R Iles is a 78 y.o. very pleasant female patient who presents with the following:  Stepped a curb about a month ago and on 11/13/2013, and hurt her foot and ankle and was wearing sandles. Since then, she has been having some intermittent swelling and pain. Right now she is not really having any significant pain, but she is having some diffuse lower extremity swelling on the LEFT side that she previously has not had before. After her ankle injury she was told to wrap this with an Ace wrap, which she has essentially not done. She has not been any sort of supportive ankle brace or Aircast, and she has continued to have some symptoms. She has not taken any kind of medication at all. She has not been any icing.  Left ankle edema left lower leg.   Past Medical History, Surgical History, Social History, Family History, Problem List, Medications, and Allergies have been reviewed and updated if relevant.  Review of Systems:  GEN: No fevers, chills. Nontoxic. Primarily MSK c/o today. MSK: Detailed in the HPI GI: tolerating PO intake without difficulty Neuro: No numbness, parasthesias, or tingling associated. Otherwise the pertinent positives of the ROS are noted above.   Objective:   Physical Examination: BP 130/80  Pulse 98  Temp(Src) 98.2 F (36.8 C) (Oral)  Ht 5' 4.75" (1.645 m)  Wt 194 lb (87.998 kg)  BMI 32.52 kg/m2   GEN: WDWN, NAD, Non-toxic, Alert & Oriented x 3 HEENT: Atraumatic, Normocephalic.  Ears and Nose: No external deformity. EXTR: 1-2+ LE edema on the L NEURO: Normal gait.  PSYCH: Normally interactive. Conversant. Not depressed or anxious appearing.  Calm demeanor.      With regards to her ankle bony anatomy, the entirety of her ankle in the forefoot, midfoot, hindfoot, and ankle is nontender. Her CFL or deltoid ligaments are nontender. Her ATFL ligament is nontender. The entirety from a bony structural anatomy standpoint is nontender and normal. Edema is significant throughout the foot and lower extremity on the LEFT.  Radiology: Dg Ankle Complete Left  11/16/2013   CLINICAL DATA:  Left ankle pain post injury 3 days ago  EXAM: LEFT ANKLE COMPLETE - 3+ VIEW  COMPARISON:  None.  FINDINGS: Three views of the left ankle submitted. No acute fracture or subluxation. Mild degenerative changes tibiotalar joint. Question old fracture of distal fibula. Ankle mortise is preserved.  IMPRESSION: No acute fracture or subluxation. Question previous fracture of distal fibula.   Electronically Signed   By: Natasha MeadLiviu  Pop M.D.   On: 11/16/2013 16:09   The radiological images were independently reviewed by myself in the office and results were reviewed with the patient. My independent interpretation of images:  There is no evidence for occult fracture whatsoever. Mortise is preserved. There is a minimal amount of degenerative change but grossly this is a normal ankle x-ray for age. Hannah BeatSpencer Copland, MD   Assessment & Plan:   Edema of left lower extremity - Plan: Lower Extremity Venous Duplex Left  Left ankle pain  Obtain a Doppler ultrasound of the LEFT lower extremity to evaluate for deep vein thrombosis. At this time those results have returned and are negative.  I think  the patient did not have adequate structural support post injury. Given history, likely lateral ankle sprain, and I placed her in an ASO ankle brace for ambulation, recommended elevation, and given her some diclofenac to take orally. Also recommended icing if additional swelling.  New Prescriptions   DICLOFENAC (VOLTAREN) 75 MG EC TABLET    Take 1 tablet (75 mg total) by mouth 2 (two) times daily.   Modified  Medications   No medications on file   Orders Placed This Encounter  Procedures  . Lower Extremity Venous Duplex Left   Follow-up: No Follow-up on file. Unless noted above, the patient is to follow-up if symptoms worsen. Red flags were reviewed with the patient.  Signed,  Elpidio GaleaSpencer T. Copland, MD, CAQ Sports Medicine   Discontinued Medications   No medications on file   Current Medications at Discharge:   Medication List       This list is accurate as of: 12/21/13 11:59 PM.  Always use your most recent med list.               amLODipine 10 MG tablet  Commonly known as:  NORVASC  TAKE 1 TABLET (10 MG TOTAL) BY MOUTH DAILY.     calcium-vitamin D 500-200 MG-UNIT per tablet  Commonly known as:  OSCAL WITH D  Take 1 tablet by mouth 2 (two) times daily.     diclofenac 75 MG EC tablet  Commonly known as:  VOLTAREN  Take 1 tablet (75 mg total) by mouth 2 (two) times daily.     fish oil-omega-3 fatty acids 1000 MG capsule  Take 1 g by mouth.     losartan 100 MG tablet  Commonly known as:  COZAAR  Take 1 tablet (100 mg total) by mouth daily.     Vitamin D3 2000 UNITS Tabs  Take 1 tablet by mouth daily.

## 2013-12-21 NOTE — Patient Instructions (Signed)
REFERRALS TO SPECIALISTS, SPECIAL TESTS (MRI, CT, ULTRASOUNDS)  GO THE WAITING ROOM AND TELL CHECK IN YOU NEED HELP WITH A REFERRAL. Either [facility] or [facility] will help you set it up.  If it is between 1-2 PM they may be at lunch.  After 5 PM, they will likely be at home.  They will call you, so please make sure the office has your correct phone number.  Referrals sometimes can be done same day if urgent, but others can take 2 or 3 days to get an appointment. Starting in 2015, many of the new Medicare insurance plans and Affordable Care Act (Obamacare) Health plans offered take much longer for referrals. They have added additional paperwork and steps.  MRI's and CT's can take up to a week for the test. (Emergencies like strokes take precedence. I will tell you if you have an emergency.)   If your referral is to an in-network [facility] office, their office may contact you directly prior to our office reaching you.  -- Examples: [facility], [facility], [facility], [facility], [facility], and many more.  Specialist appointment times vary a great deal, mostly on the specialist's schedule and if they have openings. -- Our office tries to get you in as fast as possible. -- Some specialists have very long wait times. (Example. Dermatology. Usually months) -- If you have a true emergency like new cancer, we work to get you in ASAP.   

## 2013-12-22 ENCOUNTER — Encounter (INDEPENDENT_AMBULATORY_CARE_PROVIDER_SITE_OTHER): Payer: Medicare Other

## 2013-12-22 ENCOUNTER — Telehealth: Payer: Self-pay | Admitting: *Deleted

## 2013-12-22 DIAGNOSIS — M7989 Other specified soft tissue disorders: Secondary | ICD-10-CM

## 2013-12-22 DIAGNOSIS — R600 Localized edema: Secondary | ICD-10-CM

## 2013-12-22 NOTE — Telephone Encounter (Signed)
Pt left voicemail with triage. Pt said she had an US today and it was negative for blood clots, pt wants to know what the next step will be, pt is requesting a call back

## 2013-12-23 MED ORDER — DICLOFENAC SODIUM 75 MG PO TBEC: 75.0000 mg | DELAYED_RELEASE_TABLET | Freq: Two times a day (BID) | ORAL | Status: DC

## 2013-12-23 NOTE — Telephone Encounter (Signed)
[name] notified as instructed by telephone.

## 2013-12-23 NOTE — Telephone Encounter (Signed)
No DVT.  All bone and joint from original injury. Like I said, I don't think you had adequate support post-injury, and now you have some swelling on that side.   Recommend intermittent elevation of affected foot if swollen and use of ASO ankle brace with up and walking for extended periods of time.   I also sent in some NSAIDS to her pharmacy. Take bid for next 3-4 weeks.  Ice 20 minutes at a time if swollen.  Take care.

## 2013-12-23 NOTE — Telephone Encounter (Signed)
Left message for Ms. Breth to return my call.

## 2014-04-10 ENCOUNTER — Ambulatory Visit (INDEPENDENT_AMBULATORY_CARE_PROVIDER_SITE_OTHER): Payer: Medicare Other

## 2014-04-10 DIAGNOSIS — Z23 Encounter for immunization: Secondary | ICD-10-CM

## 2014-06-06 ENCOUNTER — Encounter: Payer: Self-pay | Admitting: Family Medicine

## 2014-06-06 ENCOUNTER — Ambulatory Visit (INDEPENDENT_AMBULATORY_CARE_PROVIDER_SITE_OTHER): Payer: Medicare Other | Admitting: Family Medicine

## 2014-06-06 VITALS — BP 138/82 | HR 96 | Temp 97.8°F | Ht 64.75 in | Wt 187.2 lb

## 2014-06-06 DIAGNOSIS — L980 Pyogenic granuloma: Secondary | ICD-10-CM

## 2014-06-06 DIAGNOSIS — R2241 Localized swelling, mass and lump, right lower limb: Secondary | ICD-10-CM

## 2014-06-06 NOTE — Progress Notes (Signed)
Pre visit review using our clinic review tool, if applicable. No additional management support is needed unless otherwise documented below in the visit note. 

## 2014-06-06 NOTE — Assessment & Plan Note (Signed)
Bottom of right foot= held on by a thin stalk with a blood vessed Tied off with sterile suture to see if it will continue to auto amputate- suspect clipping this will cause excessive bleeding ? Cause but pt states she may have had a splinter in it for years  Ref to derm for further tx/ and removal   Dressed with abx oint and band aid

## 2014-06-06 NOTE — Patient Instructions (Signed)
Please stop up front for a referral to dermatology  Keep the spot on your foot clean and use antibiotic ointment over the counter and change band aid twice daily   It may come off on its own

## 2014-06-06 NOTE — Progress Notes (Signed)
   Subjective:    Patient ID: Linda CousinsLinda R Johnson, female    DOB: 01/22/1936, 78 y.o.   MRN: [id number]  HPI Here for a problem with R foot  Had something under the skin - and she squeezed it and something came out (a little blood) Not a splinter  There for several weeks    Review of Systems Review of Systems  Constitutional: Negative for fever, appetite change, fatigue and unexpected weight change.  Eyes: Negative for pain and visual disturbance.  Respiratory: Negative for cough and shortness of breath.   Cardiovascular: Negative for cp or palpitations    Gastrointestinal: Negative for nausea, diarrhea and constipation.  Genitourinary: Negative for urgency and frequency.  Skin: Negative for pallor or rash   Neurological: Negative for weakness, light-headedness, numbness and headaches.  Hematological: Negative for adenopathy. Does not bruise/bleed easily.  Psychiatric/Behavioral: Negative for dysphoric mood. The patient is not nervous/anxious.         Objective:   Physical Exam  Constitutional: She appears well-developed and well-nourished. No distress.  obese and well appearing   HENT:  Head: Normocephalic and atraumatic.  Eyes: Conjunctivae and EOM are normal. Pupils are equal, round, and reactive to light.  Baseline strabismus   Neck: Normal range of motion. Neck supple.  Cardiovascular: Normal rate and regular rhythm.   Skin: Skin is warm and dry. No rash noted. No erythema. No pallor.  Fleshy growth on bottom of R foot - connected by a thin stalk to sole of foot  Appears to have vascular supply  Stalk tied with suture material   No s/s of infection   Psychiatric: She has a normal mood and affect.          Assessment & Plan:   Problem List Items Addressed This Visit      Musculoskeletal and Integument   Pyogenic granuloma - Primary    Bottom of right foot= held on by a thin stalk with a blood vessed Tied off with sterile suture to see if it will continue to auto  amputate- suspect clipping this will cause excessive bleeding ? Cause but pt states she may have had a splinter in it for years  Ref to derm for further tx/ and removal   Dressed with abx oint and band aid

## 2014-07-05 ENCOUNTER — Ambulatory Visit: Payer: Self-pay | Admitting: Family Medicine

## 2014-07-05 ENCOUNTER — Encounter: Payer: Self-pay | Admitting: Family Medicine

## 2014-07-06 ENCOUNTER — Encounter: Payer: Self-pay | Admitting: *Deleted

## 2014-09-06 ENCOUNTER — Telehealth: Payer: Self-pay | Admitting: Family Medicine

## 2014-09-06 DIAGNOSIS — I10 Essential (primary) hypertension: Secondary | ICD-10-CM

## 2014-09-06 DIAGNOSIS — E785 Hyperlipidemia, unspecified: Secondary | ICD-10-CM

## 2014-09-06 DIAGNOSIS — M810 Age-related osteoporosis without current pathological fracture: Secondary | ICD-10-CM

## 2014-09-06 NOTE — Telephone Encounter (Signed)
-----   Message from Alvina Chouerri J Walsh sent at 08/31/2014  4:13 PM EST ----- Regarding: Lab orders for Thursday,3.10.16 Patient is scheduled for CPX labs, please order future labs, Thanks , Camelia Engerri

## 2014-09-07 ENCOUNTER — Other Ambulatory Visit (INDEPENDENT_AMBULATORY_CARE_PROVIDER_SITE_OTHER): Payer: Medicare Other

## 2014-09-07 DIAGNOSIS — M810 Age-related osteoporosis without current pathological fracture: Secondary | ICD-10-CM

## 2014-09-07 DIAGNOSIS — E785 Hyperlipidemia, unspecified: Secondary | ICD-10-CM | POA: Diagnosis not present

## 2014-09-07 DIAGNOSIS — I10 Essential (primary) hypertension: Secondary | ICD-10-CM | POA: Diagnosis not present

## 2014-09-07 LAB — COMPREHENSIVE METABOLIC PANEL
ALT: 16 U/L (ref 0–35)
AST: 20 U/L (ref 0–37)
Albumin: 4.4 g/dL (ref 3.5–5.2)
Alkaline Phosphatase: 80 U/L (ref 39–117)
BILIRUBIN TOTAL: 0.8 mg/dL (ref 0.2–1.2)
BUN: 17 mg/dL (ref 6–23)
CO2: 31 mEq/L (ref 19–32)
CREATININE: 1.02 mg/dL (ref 0.40–1.20)
Calcium: 9.9 mg/dL (ref 8.4–10.5)
Chloride: 100 mEq/L (ref 96–112)
GFR: 55.94 mL/min — ABNORMAL LOW (ref >60.00)
Glucose, Bld: 114 mg/dL — ABNORMAL HIGH (ref 70–99)
Potassium: 4.4 mEq/L (ref 3.5–5.1)
Sodium: 138 mEq/L (ref 135–145)
Total Protein: 8.4 g/dL — ABNORMAL HIGH (ref 6.0–8.3)

## 2014-09-07 LAB — CBC WITH DIFFERENTIAL/PLATELET
Basophils Absolute: 0 10*3/uL (ref 0.0–0.1)
Basophils Relative: 0.4 % (ref 0.0–3.0)
Eosinophils Absolute: 0.2 10*3/uL (ref 0.0–0.7)
Eosinophils Relative: 1.6 % (ref 0.0–5.0)
HCT: 45.4 % (ref 36.0–46.0)
HEMOGLOBIN: 15.3 g/dL — AB (ref 12.0–15.0)
Lymphocytes Relative: 20.3 % (ref 12.0–46.0)
Lymphs Abs: 2 10*3/uL (ref 0.7–4.0)
MCHC: 33.7 g/dL (ref 30.0–36.0)
MCV: 90.7 fl (ref 78.0–100.0)
MONOS PCT: 5.1 % (ref 3.0–12.0)
Monocytes Absolute: 0.5 10*3/uL (ref 0.1–1.0)
NEUTROS ABS: 7 10*3/uL (ref 1.4–7.7)
Neutrophils Relative %: 72.6 % (ref 43.0–77.0)
Platelets: 293 10*3/uL (ref 150.0–400.0)
RBC: 5.01 Mil/uL (ref 3.87–5.11)
RDW: 13.4 % (ref 11.5–15.5)
WBC: 9.7 10*3/uL (ref 4.0–10.5)

## 2014-09-07 LAB — LIPID PANEL
CHOL/HDL RATIO: 3
Cholesterol: 150 mg/dL (ref 0–200)
HDL: 44.9 mg/dL (ref >39.00)
LDL CALC: 85 mg/dL (ref 0–99)
NONHDL: 105.1
Triglycerides: 101 mg/dL (ref 0.0–149.0)
VLDL: 20.2 mg/dL (ref 0.0–40.0)

## 2014-09-07 LAB — TSH: TSH: 5.69 u[IU]/mL — ABNORMAL HIGH (ref 0.35–4.50)

## 2014-09-07 LAB — VITAMIN D 25 HYDROXY (VIT D DEFICIENCY, FRACTURES): VITD: 36.59 ng/mL (ref 30.00–100.00)

## 2014-09-11 ENCOUNTER — Ambulatory Visit (INDEPENDENT_AMBULATORY_CARE_PROVIDER_SITE_OTHER): Payer: Medicare Other | Admitting: Family Medicine

## 2014-09-11 ENCOUNTER — Encounter: Payer: Self-pay | Admitting: Family Medicine

## 2014-09-11 VITALS — BP 138/78 | HR 82 | Temp 98.0°F | Ht 64.5 in | Wt 190.0 lb

## 2014-09-11 DIAGNOSIS — I10 Essential (primary) hypertension: Secondary | ICD-10-CM

## 2014-09-11 DIAGNOSIS — R7989 Other specified abnormal findings of blood chemistry: Secondary | ICD-10-CM | POA: Insufficient documentation

## 2014-09-11 DIAGNOSIS — E039 Hypothyroidism, unspecified: Secondary | ICD-10-CM

## 2014-09-11 DIAGNOSIS — M810 Age-related osteoporosis without current pathological fracture: Secondary | ICD-10-CM | POA: Diagnosis not present

## 2014-09-11 DIAGNOSIS — R739 Hyperglycemia, unspecified: Secondary | ICD-10-CM | POA: Diagnosis not present

## 2014-09-11 DIAGNOSIS — Z23 Encounter for immunization: Secondary | ICD-10-CM

## 2014-09-11 DIAGNOSIS — R7303 Prediabetes: Secondary | ICD-10-CM | POA: Insufficient documentation

## 2014-09-11 DIAGNOSIS — E785 Hyperlipidemia, unspecified: Secondary | ICD-10-CM | POA: Diagnosis not present

## 2014-09-11 DIAGNOSIS — E2839 Other primary ovarian failure: Secondary | ICD-10-CM

## 2014-09-11 DIAGNOSIS — Z0000 Encounter for general adult medical examination without abnormal findings: Secondary | ICD-10-CM | POA: Insufficient documentation

## 2014-09-11 MED ORDER — AMLODIPINE BESYLATE 10 MG PO TABS: ORAL_TABLET | ORAL | Status: DC

## 2014-09-11 MED ORDER — LOSARTAN POTASSIUM 100 MG PO TABS: 100.0000 mg | ORAL_TABLET | Freq: Every day | ORAL | Status: DC

## 2014-09-11 NOTE — Assessment & Plan Note (Signed)
Lab Results  Component Value Date   TSH 5.69* 09/07/2014   slt high  ? If early hypothyroid or lab error  Re check in 1 mo with free T4  Some dry skin -otherwise clinically no symptoms

## 2014-09-11 NOTE — Assessment & Plan Note (Signed)
Reviewed health habits including diet and exercise and skin cancer prevention Reviewed appropriate screening tests for age  Also reviewed health mt list, fam hx and immunization status , as well as social and family history   prevnar vaccine today  Labs reviewed  Disc DM risk-will follow Scheduled dexa f/u   Enc gradual adv of exercise to improve conditioning

## 2014-09-11 NOTE — Assessment & Plan Note (Signed)
Disc goals for lipids and reasons to control them Rev labs with pt Rev low sat fat diet in detail This was improved Commended on better diet

## 2014-09-11 NOTE — Assessment & Plan Note (Signed)
bp in fair control at this time  BP Readings from Last 1 Encounters:  09/11/14 138/78   No changes needed Disc lifstyle change with low sodium diet and exercise  Labs reviewed Medications renewed for the year  Enc exercise

## 2014-09-11 NOTE — Progress Notes (Signed)
Pre visit review using our clinic review tool, if applicable. No additional management support is needed unless otherwise documented below in the visit note. 

## 2014-09-11 NOTE — Patient Instructions (Signed)
prevnar vaccine today  Schedule a on fasting lab appointment for 1-2 months to re check thyroid  Your blood sugar is a little high - work on low sugar diet and weight loss to prevent diabetes-we will keep an eye on that  Increase your vitamin D3 intake to 2000 iu once daily  Stop at check out for referral for bone density test

## 2014-09-11 NOTE — Assessment & Plan Note (Signed)
Disc need for calcium/ vitamin D/ wt bearing exercise and bone density test every 2 y to monitor Disc safety/ fracture risk in detail  Ref for dexa f/u  Has had 5 y of fosamax  No falls or fx Adv to inc D3 to 2000 iu daily for level in the 30s  Enc exercise

## 2014-09-11 NOTE — Assessment & Plan Note (Signed)
Glucose was 114 fasting Disc pre diabetes  Enc wt loss and more exercise  Also low glycemic diet (reviewed)

## 2014-09-11 NOTE — Progress Notes (Signed)
Subjective:    Patient ID: Linda Johnson, female    DOB: 02/16/1936, 79 y.o.   MRN: [id number]  HPI Here for annual medicare wellness visit as well as chronic/acute medical problems   Wt is up 3 lb with bmi of 32 Obese Feels good  Nothing new going on  Tries to take care of herself  Not enough exercise however --to hard to fit it in   I have personally reviewed the Medicare Annual Wellness questionnaire and have noted 1. The patient's medical and social history 2. Their use of alcohol, tobacco or illicit drugs 3. Their current medications and supplements 4. The patient's functional ability including ADL's, fall risks, home safety risks and hearing or visual             impairment. 5. Diet and physical activities 6. Evidence for depression or mood disorders  The patients weight, height, BMI have been recorded in the chart and visual acuity is per eye clinic.  I have made referrals, counseling and provided education to the patient based review of the above and I have provided the pt with a written personalized care plan for preventive services.  See scanned forms.  Routine anticipatory guidance given to patient.  See health maintenance. Colon cancer screening colonosc 5/14 - 5 year recall acc to her report -but pt states she was told she does not need another one due to age  Breast cancer screening 1/16 nl  Self breast exam -no lumps or changes  Flu vaccine 10/15 Tetanus vaccine 07 Pneumovax - had 10/10- is due for prevnar - will do that today  Zoster vaccine - declines   Advance directive- she has a living will/ Advance directive  Cognitive function addressed- see scanned forms- and if abnormal then additional documentation follows.  Great-no problems   PMH and SH reviewed  Meds, vitals, and allergies reviewed.   ROS: See HPI.  Otherwise negative.    bp is stable today  No cp or palpitations or headaches or edema  No side effects to medicines  BP Readings from Last 3  Encounters:  09/11/14 138/78  06/06/14 138/82  12/21/13 130/80     Tsh is up a bit  Pt has no clinical changes No change in energy level/ hair or skin/ edema and no tremor Lab Results  Component Value Date   TSH 5.69* 09/07/2014     Glucose is 114 No DM in family  She does not eat a lot of sweets    OP  dexa 12/13- wants to schedule her f/u  D level is 36 Had 5 y of fosamax in the past  No recent falls or fractures (is very careful)   Cholesterol Lab Results  Component Value Date   CHOL 150 09/07/2014   CHOL 171 09/05/2013   CHOL 169 08/17/2012   Lab Results  Component Value Date   HDL 44.90 09/07/2014   HDL 44.00 09/05/2013   HDL 39.60 08/17/2012   Lab Results  Component Value Date   LDLCALC 85 09/07/2014   LDLCALC 100* 09/05/2013   LDLCALC 104* 08/17/2012   Lab Results  Component Value Date   TRIG 101.0 09/07/2014   TRIG 137.0 09/05/2013   TRIG 129.0 08/17/2012   Lab Results  Component Value Date   CHOLHDL 3 09/07/2014   CHOLHDL 4 09/05/2013   CHOLHDL 4 08/17/2012   Lab Results  Component Value Date   LDLDIRECT 132.4 05/06/2010   LDLDIRECT 107.9 04/02/2007    Improved  cholesterol    Chemistry      Component Value Date/Time   NA 138 09/07/2014 0940   K 4.4 09/07/2014 0940   CL 100 09/07/2014 0940   CO2 31 09/07/2014 0940   BUN 17 09/07/2014 0940   CREATININE 1.02 09/07/2014 0940      Component Value Date/Time   CALCIUM 9.9 09/07/2014 0940   ALKPHOS 80 09/07/2014 0940   AST 20 09/07/2014 0940   ALT 16 09/07/2014 0940   BILITOT 0.8 09/07/2014 0940      Lab Results  Component Value Date   WBC 9.7 09/07/2014   HGB 15.3* 09/07/2014   HCT 45.4 09/07/2014   MCV 90.7 09/07/2014   PLT 293.0 09/07/2014    Patient Active Problem List   Diagnosis Date Noted  . Hyperglycemia 09/11/2014  . Encounter for Medicare annual wellness exam 09/11/2014  . Pyogenic granuloma 06/06/2014  . Left ankle injury 11/16/2013  . Routine general medical  examination at a health care facility 09/09/2013  . Low back pain 03/21/2013  . Nausea alone 03/21/2013  . Other screening mammogram 04/19/2012  . Post-menopausal 04/19/2012  . Obesity 08/18/2011  . Hyperlipidemia 06/12/2010  . Osteoporosis 06/12/2010  . ALLERGIC RHINITIS CAUSE UNSPECIFIED 04/12/2008  . HX, PERSONAL, COLONIC POLYPS 04/02/2007  . STRABISMUS 03/02/2007  . Essential hypertension 03/02/2007   No past medical history on file. No past surgical history on file. History  Substance Use Topics  . Smoking status: Never Smoker   . Smokeless tobacco: Never Used  . Alcohol Use: No   No family history on file. Allergies  Allergen Reactions  . Ace Inhibitors     REACTION: cough  . Tramadol Hcl     nausea   Current Outpatient Prescriptions on File Prior to Visit  Medication Sig Dispense Refill  . amLODipine (NORVASC) 10 MG tablet TAKE 1 TABLET (10 MG TOTAL) BY MOUTH DAILY. 90 tablet 3  . calcium-vitamin D (OSCAL WITH D) 500-200 MG-UNIT per tablet Take 1 tablet by mouth 2 (two) times daily.    . Cholecalciferol (VITAMIN D3) 2000 UNITS TABS Take 1 tablet by mouth daily.    . fish oil-omega-3 fatty acids 1000 MG capsule Take 1 g by mouth.    . losartan (COZAAR) 100 MG tablet Take 1 tablet (100 mg total) by mouth daily. 90 tablet 3   No current facility-administered medications on file prior to visit.     Review of Systems Review of Systems  Constitutional: Negative for fever, appetite change, fatigue and unexpected weight change.  Eyes: Negative for pain and visual disturbance.  Respiratory: Negative for cough and pos for sob on exertion at times due to "being out of shape".   Cardiovascular: Negative for cp or palpitations    Gastrointestinal: Negative for nausea, diarrhea and constipation.  Genitourinary: Negative for urgency and frequency.  Skin: Negative for pallor or rash   Neurological: Negative for weakness, light-headedness, numbness and headaches.    Hematological: Negative for adenopathy. Does not bruise/bleed easily.  Psychiatric/Behavioral: Negative for dysphoric mood. The patient is not nervous/anxious.         Objective:   Physical Exam  Constitutional: She appears well-developed and well-nourished. No distress.  obese and well appearing   HENT:  Head: Normocephalic and atraumatic.  Right Ear: External ear normal.  Left Ear: External ear normal.  Nose: Nose normal.  Mouth/Throat: Oropharynx is clear and moist.  Scant cerumen Hard of hearing   Eyes: Conjunctivae and EOM are normal. Pupils are  equal, round, and reactive to light. Right eye exhibits no discharge. Left eye exhibits no discharge. No scleral icterus.  Baseline strabismus  Neck: Normal range of motion. Neck supple. No JVD present. Carotid bruit is not present. No thyromegaly present.  Cardiovascular: Normal rate, regular rhythm, normal heart sounds and intact distal pulses.  Exam reveals no gallop.   Pulmonary/Chest: Effort normal and breath sounds normal. No respiratory distress. She has no wheezes. She has no rales.  Abdominal: Soft. Bowel sounds are normal. She exhibits no distension and no mass. There is no tenderness.  Musculoskeletal: She exhibits no edema or tenderness.  Mild kyphosis   Lymphadenopathy:    She has no cervical adenopathy.  Neurological: She is alert. She has normal reflexes. No cranial nerve deficit. She exhibits normal muscle tone. Coordination normal.  Skin: Skin is warm and dry. No rash noted. No erythema. No pallor.  Psychiatric: She has a normal mood and affect.          Assessment & Plan:   Problem List Items Addressed This Visit      Cardiovascular and Mediastinum   Essential hypertension    bp in fair control at this time  BP Readings from Last 1 Encounters:  09/11/14 138/78   No changes needed Disc lifstyle change with low sodium diet and exercise  Labs reviewed Medications renewed for the year  Enc exercise        Relevant Medications   amLODIpine (NORVASC) tablet   losartan (COZAAR) tablet     Musculoskeletal and Integument   Osteoporosis    Disc need for calcium/ vitamin D/ wt bearing exercise and bone density test every 2 y to monitor Disc safety/ fracture risk in detail  Ref for dexa f/u  Has had 5 y of fosamax  No falls or fx Adv to inc D3 to 2000 iu daily for level in the 30s  Enc exercise         Other   Abnormal TSH    Lab Results  Component Value Date   TSH 5.69* 09/07/2014   slt high  ? If early hypothyroid or lab error  Re check in 1 mo with free T4  Some dry skin -otherwise clinically no symptoms       Relevant Orders   TSH   T4, Free   Encounter for Medicare annual wellness exam - Primary    Reviewed health habits including diet and exercise and skin cancer prevention Reviewed appropriate screening tests for age  Also reviewed health mt list, fam hx and immunization status , as well as social and family history   prevnar vaccine today  Labs reviewed  Disc DM risk-will follow Scheduled dexa f/u   Enc gradual adv of exercise to improve conditioning       Estrogen deficiency   Relevant Orders   DG Bone Density   Hyperglycemia    Glucose was 114 fasting Disc pre diabetes  Enc wt loss and more exercise  Also low glycemic diet (reviewed)      Hyperlipidemia    Disc goals for lipids and reasons to control them Rev labs with pt Rev low sat fat diet in detail This was improved Commended on better diet       Relevant Medications   amLODIpine (NORVASC) tablet   losartan (COZAAR) tablet

## 2014-09-12 ENCOUNTER — Telehealth: Payer: Self-pay | Admitting: Family Medicine

## 2014-09-12 NOTE — Telephone Encounter (Signed)
emmi mailed  °

## 2014-10-02 ENCOUNTER — Ambulatory Visit
Admit: 2014-10-02 | Disposition: A | Discharge status: Home / Self Care | Payer: Self-pay | Attending: Family Medicine | Admitting: Family Medicine

## 2014-10-03 ENCOUNTER — Encounter: Payer: Self-pay | Admitting: Family Medicine

## 2014-10-09 ENCOUNTER — Telehealth: Payer: Self-pay

## 2014-10-09 NOTE — Telephone Encounter (Signed)
Left message for patient to call back regarding alendronate question.

## 2014-10-09 NOTE — Telephone Encounter (Signed)
-----   Message from Judy PimpleMarne A Tower, MD sent at 10/08/2014  5:46 PM EDT ----- Osteoporosis has progressed some from last check  I know she has already taken fosamax  Use fall precautions and continue vit D  If she is interested in further therapy for OP there is a medicine called evista we can try - if she wants to check insurance about coverage of this please let me know

## 2014-10-09 NOTE — Telephone Encounter (Signed)
Pt left v/m requesting cb about alendronate; left v/m requesting pt to cb.

## 2014-10-09 NOTE — Telephone Encounter (Signed)
Patient is not interested in therapy at this time.  She will look into evista and get back to us.

## 2014-10-10 NOTE — Telephone Encounter (Signed)
Pt left v/m requesting cb [phone].

## 2014-10-10 NOTE — Telephone Encounter (Signed)
Left message advising patient to leave detailed message in regards to why she is calling.

## 2014-10-11 MED ORDER — ALENDRONATE SODIUM 70 MG PO TABS: 70.0000 mg | ORAL_TABLET | ORAL | Status: DC

## 2014-10-11 NOTE — Telephone Encounter (Signed)
Patient would like to go back on alendronate for her osteoporosis.  She said it is affordable with her insurance.  She cannot afford evista.  Patient unsure of when and how long she took alendronate.

## 2014-10-11 NOTE — Addendum Note (Signed)
Addended by: Judd GaudierLEVENS, Dniyah Grant M on: 10/11/2014 02:07 PM   Modules accepted: Orders

## 2014-10-11 NOTE — Telephone Encounter (Signed)
If she thinks it may have been less than 5 years-let's go ahead and treat for another 2 years  Please send it in  Thanks

## 2014-10-11 NOTE — Telephone Encounter (Signed)
That is a medicine for osteoporosis  My notes indicate that she has already been on that in the past, correct? - if she took 5 or more years of it -it may not help her any more  What was the last year she took it?  Thanks

## 2014-10-11 NOTE — Telephone Encounter (Signed)
Pt left v/m requesting 90 day supply of alendronate 70 mg to be sent to [facility]. Pt request cb.

## 2014-10-11 NOTE — Telephone Encounter (Signed)
Alendronate sent to pharmacy.  Patient aware.

## 2014-11-13 ENCOUNTER — Other Ambulatory Visit (INDEPENDENT_AMBULATORY_CARE_PROVIDER_SITE_OTHER): Payer: Medicare Other

## 2014-11-13 DIAGNOSIS — R7989 Other specified abnormal findings of blood chemistry: Secondary | ICD-10-CM | POA: Diagnosis not present

## 2014-11-13 DIAGNOSIS — E039 Hypothyroidism, unspecified: Secondary | ICD-10-CM | POA: Diagnosis not present

## 2014-11-13 LAB — T4, FREE: Free T4: 0.71 ng/dL (ref 0.60–1.60)

## 2014-11-13 LAB — TSH: TSH: 5.67 u[IU]/mL — ABNORMAL HIGH (ref 0.35–4.50)

## 2014-11-14 ENCOUNTER — Encounter: Payer: Self-pay | Admitting: *Deleted

## 2014-11-25 ENCOUNTER — Other Ambulatory Visit: Payer: Self-pay | Admitting: Family Medicine

## 2014-12-12 ENCOUNTER — Encounter: Payer: Self-pay | Admitting: Family Medicine

## 2014-12-12 DIAGNOSIS — E039 Hypothyroidism, unspecified: Secondary | ICD-10-CM | POA: Insufficient documentation

## 2014-12-14 NOTE — Addendum Note (Signed)
Addended by: [name] A on: 12/14/2014 08:22 PM   Modules accepted: Orders

## 2015-02-05 ENCOUNTER — Other Ambulatory Visit (INDEPENDENT_AMBULATORY_CARE_PROVIDER_SITE_OTHER): Payer: Medicare Other

## 2015-02-05 DIAGNOSIS — E039 Hypothyroidism, unspecified: Secondary | ICD-10-CM | POA: Diagnosis not present

## 2015-02-05 LAB — T4, FREE: FREE T4: 0.75 ng/dL (ref 0.60–1.60)

## 2015-02-05 LAB — TSH: TSH: 6.9 u[IU]/mL — ABNORMAL HIGH (ref 0.35–4.50)

## 2015-02-12 ENCOUNTER — Ambulatory Visit: Payer: Medicare Other | Admitting: Family Medicine

## 2015-04-13 ENCOUNTER — Ambulatory Visit (INDEPENDENT_AMBULATORY_CARE_PROVIDER_SITE_OTHER): Payer: Medicare Other

## 2015-04-13 DIAGNOSIS — Z23 Encounter for immunization: Secondary | ICD-10-CM

## 2015-05-29 ENCOUNTER — Other Ambulatory Visit: Payer: Self-pay | Admitting: Family Medicine

## 2015-05-29 DIAGNOSIS — Z1231 Encounter for screening mammogram for malignant neoplasm of breast: Secondary | ICD-10-CM

## 2015-07-10 ENCOUNTER — Ambulatory Visit: Payer: Medicare Other

## 2015-07-25 ENCOUNTER — Ambulatory Visit
Admission: RE | Admit: 2015-07-25 | Discharge: 2015-07-25 | Disposition: A | Discharge status: Home / Self Care | Payer: Medicare Other | Source: Ambulatory Visit | Attending: Family Medicine | Admitting: Family Medicine

## 2015-07-25 ENCOUNTER — Other Ambulatory Visit: Payer: Self-pay | Admitting: Family Medicine

## 2015-07-25 DIAGNOSIS — Z1231 Encounter for screening mammogram for malignant neoplasm of breast: Secondary | ICD-10-CM | POA: Insufficient documentation

## 2015-07-25 LAB — HM MAMMOGRAPHY: HM MAMMO: NORMAL

## 2015-07-26 ENCOUNTER — Encounter: Payer: Self-pay | Admitting: Family Medicine

## 2015-07-26 ENCOUNTER — Encounter: Payer: Self-pay | Admitting: *Deleted

## 2015-09-09 ENCOUNTER — Telehealth: Payer: Self-pay | Admitting: Family Medicine

## 2015-09-09 DIAGNOSIS — R739 Hyperglycemia, unspecified: Secondary | ICD-10-CM

## 2015-09-09 DIAGNOSIS — E785 Hyperlipidemia, unspecified: Secondary | ICD-10-CM

## 2015-09-09 DIAGNOSIS — I10 Essential (primary) hypertension: Secondary | ICD-10-CM

## 2015-09-09 DIAGNOSIS — R7989 Other specified abnormal findings of blood chemistry: Secondary | ICD-10-CM

## 2015-09-09 DIAGNOSIS — E039 Hypothyroidism, unspecified: Secondary | ICD-10-CM

## 2015-09-09 NOTE — Telephone Encounter (Signed)
-----   Message from Alvina Chouerri J Walsh sent at 09/06/2015 11:24 AM EST ----- Regarding: Lab orders for Tuesday, 3.14.17  AWV lab orders, please.

## 2015-09-11 ENCOUNTER — Other Ambulatory Visit: Payer: Medicare Other

## 2015-09-11 ENCOUNTER — Ambulatory Visit: Payer: Medicare Other

## 2015-09-12 ENCOUNTER — Other Ambulatory Visit: Payer: Medicare Other

## 2015-09-13 ENCOUNTER — Other Ambulatory Visit (INDEPENDENT_AMBULATORY_CARE_PROVIDER_SITE_OTHER): Payer: Medicare Other

## 2015-09-13 ENCOUNTER — Ambulatory Visit (INDEPENDENT_AMBULATORY_CARE_PROVIDER_SITE_OTHER): Payer: Medicare Other

## 2015-09-13 VITALS — BP 132/80 | HR 92 | Temp 97.4°F | Ht 65.0 in | Wt 192.0 lb

## 2015-09-13 DIAGNOSIS — Z0000 Encounter for general adult medical examination without abnormal findings: Secondary | ICD-10-CM

## 2015-09-13 DIAGNOSIS — I10 Essential (primary) hypertension: Secondary | ICD-10-CM

## 2015-09-13 DIAGNOSIS — E039 Hypothyroidism, unspecified: Secondary | ICD-10-CM | POA: Diagnosis not present

## 2015-09-13 DIAGNOSIS — E785 Hyperlipidemia, unspecified: Secondary | ICD-10-CM

## 2015-09-13 DIAGNOSIS — R739 Hyperglycemia, unspecified: Secondary | ICD-10-CM | POA: Diagnosis not present

## 2015-09-13 LAB — COMPREHENSIVE METABOLIC PANEL
ALBUMIN: 4.4 g/dL (ref 3.5–5.2)
ALT: 15 U/L (ref 0–35)
AST: 18 U/L (ref 0–37)
Alkaline Phosphatase: 73 U/L (ref 39–117)
BILIRUBIN TOTAL: 0.8 mg/dL (ref 0.2–1.2)
BUN: 16 mg/dL (ref 6–23)
CALCIUM: 10 mg/dL (ref 8.4–10.5)
CO2: 28 mEq/L (ref 19–32)
CREATININE: 1.07 mg/dL (ref 0.40–1.20)
Chloride: 101 mEq/L (ref 96–112)
GFR: 52.79 mL/min — ABNORMAL LOW (ref >60.00)
Glucose, Bld: 134 mg/dL — ABNORMAL HIGH (ref 70–99)
Potassium: 4.5 mEq/L (ref 3.5–5.1)
SODIUM: 138 meq/L (ref 135–145)
TOTAL PROTEIN: 8.5 g/dL — AB (ref 6.0–8.3)

## 2015-09-13 LAB — LIPID PANEL
CHOLESTEROL: 175 mg/dL (ref 0–200)
HDL: 45 mg/dL (ref >39.00)
LDL Cholesterol: 104 mg/dL — ABNORMAL HIGH (ref 0–99)
NONHDL: 130.36
Total CHOL/HDL Ratio: 4
Triglycerides: 132 mg/dL (ref 0.0–149.0)
VLDL: 26.4 mg/dL (ref 0.0–40.0)

## 2015-09-13 LAB — CBC WITH DIFFERENTIAL/PLATELET
Basophils Absolute: 0 10*3/uL (ref 0.0–0.1)
Basophils Relative: 0.4 % (ref 0.0–3.0)
Eosinophils Absolute: 0.2 10*3/uL (ref 0.0–0.7)
Eosinophils Relative: 1.9 % (ref 0.0–5.0)
HCT: 47.1 % — ABNORMAL HIGH (ref 36.0–46.0)
Hemoglobin: 16 g/dL — ABNORMAL HIGH (ref 12.0–15.0)
Lymphocytes Relative: 27.3 % (ref 12.0–46.0)
Lymphs Abs: 2.4 10*3/uL (ref 0.7–4.0)
MCHC: 33.9 g/dL (ref 30.0–36.0)
MCV: 89.8 fl (ref 78.0–100.0)
MONO ABS: 0.4 10*3/uL (ref 0.1–1.0)
Monocytes Relative: 5.1 % (ref 3.0–12.0)
NEUTROS ABS: 5.7 10*3/uL (ref 1.4–7.7)
NEUTROS PCT: 65.3 % (ref 43.0–77.0)
Platelets: 290 10*3/uL (ref 150.0–400.0)
RBC: 5.25 Mil/uL — AB (ref 3.87–5.11)
RDW: 14 % (ref 11.5–15.5)
WBC: 8.8 10*3/uL (ref 4.0–10.5)

## 2015-09-13 LAB — HEMOGLOBIN A1C: HEMOGLOBIN A1C: 6.1 % (ref 4.6–6.5)

## 2015-09-13 LAB — TSH: TSH: 6.52 u[IU]/mL — ABNORMAL HIGH (ref 0.35–4.50)

## 2015-09-13 NOTE — Progress Notes (Signed)
Pre visit review using our clinic review tool, if applicable. No additional management support is needed unless otherwise documented below in the visit note. 

## 2015-09-13 NOTE — Patient Instructions (Signed)
Linda Johnson , Thank you for taking time to come for your Medicare Wellness Visit. I appreciate your ongoing commitment to your health goals. Please review the following plan we discussed and let me know if I can assist you in the future.   These are the goals we discussed: Goals    . Eat more fruits and vegetables     Starting 09/13/2015, I will try to eat at least 5 svgs of fresh fruits and vegetables daily.        This is a list of the screening recommended for you and due dates:  Health Maintenance  Topic Date Due  . Shingles Vaccine  08/23/2022*  . Flu Shot  01/29/2016  . Tetanus Vaccine  04/23/2016  . Mammogram  07/24/2016  . DEXA scan (bone density measurement)  Completed  . Pneumonia vaccines  Completed  *Topic was postponed. The date shown is not the original due date.   Preventive Care for Adults  A healthy lifestyle and preventive care can promote health and wellness. Preventive health guidelines for adults include the following key practices.  . A routine yearly physical is a good way to check with your health care provider about your health and preventive screening. It is a chance to share any concerns and updates on your health and to receive a thorough exam.  . Visit your dentist for a routine exam and preventive care every 6 months. Brush your teeth twice a day and floss once a day. Good oral hygiene prevents tooth decay and gum disease.  . The frequency of eye exams is based on your age, health, family medical history, use  of contact lenses, and other factors. Follow your health care provider's ecommendations for frequency of eye exams.  . Eat a healthy diet. Foods like vegetables, fruits, whole grains, low-fat dairy products, and lean protein foods contain the nutrients you need without too many calories. Decrease your intake of foods high in solid fats, added sugars, and salt. Eat the right amount of calories for you. Get information about a proper diet from your  health care provider, if necessary.  . Regular physical exercise is one of the most important things you can do for your health. Most adults should get at least 150 minutes of moderate-intensity exercise (any activity that increases your heart rate and causes you to sweat) each week. In addition, most adults need muscle-strengthening exercises on 2 or more days a week.  Silver Sneakers may be a benefit available to you. To determine eligibility, you may visit the website: www.silversneakers.com or contact program at [phone])[phone] Mon-Fri between 8AM-8PM.   . Maintain a healthy weight. The body mass index (BMI) is a screening tool to identify possible weight problems. It provides an estimate of body fat based on height and weight. Your health care provider can find your BMI and can help you achieve or maintain a healthy weight.   For adults 20 years and older: ? A BMI below 18.5 is considered underweight. ? A BMI of 18.5 to 24.9 is normal. ? A BMI of 25 to 29.9 is considered overweight. ? A BMI of 30 and above is considered obese.   . Maintain normal blood lipids and cholesterol levels by exercising and minimizing your intake of saturated fat. Eat a balanced diet with plenty of fruit and vegetables. Blood tests for lipids and cholesterol should begin at age 78 and be repeated every 5 years. If your lipid or cholesterol levels are high, you are over  50, or you are at high risk for heart disease, you may need your cholesterol levels checked more frequently. Ongoing high lipid and cholesterol levels should be treated with medicines if diet and exercise are not working.  . If you smoke, find out from your health care provider how to quit. If you do not use tobacco, please do not start.  . If you choose to drink alcohol, please do not consume more than 2 drinks per day. One drink is considered to be 12 ounces (355 mL) of beer, 5 ounces (148 mL) of wine, or 1.5 ounces (44 mL) of liquor.  . If you are  47-85 years old, ask your health care provider if you should take aspirin to prevent strokes.  . Use sunscreen. Apply sunscreen liberally and repeatedly throughout the day. You should seek shade when your shadow is shorter than you. Protect yourself by wearing long sleeves, pants, a wide-brimmed hat, and sunglasses year round, whenever you are outdoors.  . Once a month, do a whole body skin exam, using a mirror to look at the skin on your back. Tell your health care provider of new moles, moles that have irregular borders, moles that are larger than a pencil eraser, or moles that have changed in shape or color.

## 2015-09-13 NOTE — Progress Notes (Signed)
Subjective:   Linda Johnson is a 80 y.o. female who presents for Medicare Annual (Subsequent) preventive examination.  Cardiac Risk Factors include: advanced age (>35men, >76 women);dyslipidemia;obesity (BMI >30kg/m2);hypertension     Objective:     Vitals: BP 132/80 mmHg  Pulse 92  Temp(Src) 97.4 F (36.3 C) (Oral)  Ht  (1.651 m)  Wt 192 lb (87.091 kg)  BMI 31.95 kg/m2  SpO2 92%  Tobacco History  Smoking status  . Never Smoker   Smokeless tobacco  . Never Used     Counseling given: No   History reviewed. No pertinent past medical history. Past Surgical History  Procedure Laterality Date  . Breast biopsy Left 2011    2 areas, cysts per pt   Family History  Problem Relation Age of Onset  . Breast cancer Maternal Aunt 50   History  Sexual Activity  . Sexual Activity: No    Outpatient Encounter Prescriptions as of 09/13/2015  Medication Sig  . alendronate (FOSAMAX) 70 MG tablet Take 1 tablet (70 mg total) by mouth every 7 (seven) days. Take with a full glass of water on an empty stomach.  Marland Kitchen amLODipine (NORVASC) 10 MG tablet TAKE 1 TABLET (10 MG TOTAL) BY MOUTH DAILY.  . calcium-vitamin D (OSCAL WITH D) 500-200 MG-UNIT per tablet Take 1 tablet by mouth 2 (two) times daily.  . Cholecalciferol (VITAMIN D3) 2000 UNITS TABS Take 1 tablet by mouth 2 (two) times daily.   . fish oil-omega-3 fatty acids 1000 MG capsule Take 1 g by mouth.  . losartan (COZAAR) 100 MG tablet Take 1 tablet (100 mg total) by mouth daily.   No facility-administered encounter medications on file as of 09/13/2015.    Activities of Daily Living In your present state of health, do you have any difficulty performing the following activities: 09/13/2015  Hearing? Y  Vision? N  Difficulty concentrating or making decisions? N  Walking or climbing stairs? N  Dressing or bathing? N  Doing errands, shopping? N  Preparing Food and eating ? N  Using the Toilet? N  In the past six months, have  you accidently leaked urine? N  Do you have problems with loss of bowel control? N  Managing your Medications? N  Managing your Finances? N  Housekeeping or managing your Housekeeping? N    Patient Care Team: Judy Pimple, MD as PCP - General    Assessment:      Hearing Screening           Right ear:   0 0 0 0   Left ear:   0 40 40 40   Comments: Discussed audiology referral   Visual Acuity Screening   Right eye Left eye Both eyes  Without correction:     With correction:  Comments: Last eye exam in 2011  Exercise Activities and Dietary recommendations Current Exercise Habits: Home exercise routine, Type of exercise: walking, Time (Minutes): 60, Frequency (Times/Week): 5, Weekly Exercise (Minutes/Week): 300, Intensity: Mild, Exercise limited by: None identified  Goals    . Eat more fruits and vegetables     Starting 09/13/2015, I will try to eat at least 5 svgs of fresh fruits and vegetables daily.       Fall Risk Fall Risk  09/13/2015 09/11/2014 09/09/2013 08/23/2012  Falls in the past year? No No No Yes  Number falls in past yr: - - - 1  Risk for fall due to : History of fall(s) - - -  Risk for fall due to (comments): fall approx 4 yrs ago with injury - - -   Depression Screen PHQ 2/9 Scores 09/13/2015 09/11/2014 09/09/2013 08/23/2012  PHQ - 2 Score 0 0 0 0     Cognitive Testing MMSE - Mini Mental State Exam 09/13/2015  Orientation to time 5  Orientation to Place 5  Registration 3  Attention/ Calculation 5  Recall 3  Language- name 2 objects 0  Language- repeat 1  Language- follow 3 step command 3  Language- read & follow direction 1  Write a sentence 0  Copy design 0  Total score 26    Immunization History  Administered Date(s) Administered  . Influenza Split 04/01/2011, 03/17/2012  . Influenza Whole 04/23/2006, 04/02/2007, 03/27/2008, 04/16/2009, 04/23/2010  . Influenza,inj,Quad PF,36+ Mos  03/24/2013, 04/10/2014, 04/13/2015  . Pneumococcal Conjugate-13 09/11/2014  . Pneumococcal Polysaccharide-23 04/16/2009  . Td 04/23/2006   Screening Tests Health Maintenance  Topic Date Due  . ZOSTAVAX  08/23/2022 (Originally 04/07/1998)  . INFLUENZA VACCINE  01/29/2016  . TETANUS/TDAP  04/23/2016  . MAMMOGRAM  07/24/2016  . DEXA SCAN  Completed  . PNA vac Low Risk Adult  Completed      Plan:     I have personally reviewed the Medicare Annual Wellness questionnaire and have noted the following in the patient's chart:  A. Medical and social history B. Use of alcohol, tobacco or illicit drugs  C. Current medications and supplements D. Functional ability and status E.  Nutritional status F.  Physical activity G. Advance directives H. List of other physicians I.  Hospitalizations, surgeries, and ER visits in previous 12 months J.  Vitals K. Screenings to include hearing, vision, cognitive, depression L. Referrals and appointments - none  In addition, I reviewed preventive protocols, quality metrics, and best practice recommendations specific to patient. A written personalized care plan for preventive services as well as general preventive health recommendations were provided to patient.  See attached scanned questionnaire for additional information.   Signed,   Randa EvensLesia Pinson, MHA, BS, LPN Health Advisor 09/13/2015

## 2015-09-13 NOTE — Progress Notes (Signed)
   Subjective:    Patient ID: Linda CousinsLinda R Johnson, female    DOB: 01/25/1936, 80 y.o.   MRN: [id number]  HPI    Review of Systems     Objective:   Physical Exam        Assessment & Plan:  I reviewed health advisor's note, was available for consultation, and agree with documentation and plan.

## 2015-09-17 ENCOUNTER — Encounter: Payer: Self-pay | Admitting: Family Medicine

## 2015-09-17 ENCOUNTER — Ambulatory Visit (INDEPENDENT_AMBULATORY_CARE_PROVIDER_SITE_OTHER): Payer: Medicare Other | Admitting: Family Medicine

## 2015-09-17 ENCOUNTER — Telehealth: Payer: Self-pay

## 2015-09-17 VITALS — BP 130/80 | HR 103 | Temp 98.0°F | Ht 64.5 in | Wt 192.0 lb

## 2015-09-17 DIAGNOSIS — I4891 Unspecified atrial fibrillation: Secondary | ICD-10-CM | POA: Insufficient documentation

## 2015-09-17 DIAGNOSIS — E785 Hyperlipidemia, unspecified: Secondary | ICD-10-CM

## 2015-09-17 DIAGNOSIS — E039 Hypothyroidism, unspecified: Secondary | ICD-10-CM | POA: Diagnosis not present

## 2015-09-17 DIAGNOSIS — I4821 Permanent atrial fibrillation: Secondary | ICD-10-CM | POA: Insufficient documentation

## 2015-09-17 DIAGNOSIS — R739 Hyperglycemia, unspecified: Secondary | ICD-10-CM

## 2015-09-17 DIAGNOSIS — M810 Age-related osteoporosis without current pathological fracture: Secondary | ICD-10-CM | POA: Diagnosis not present

## 2015-09-17 DIAGNOSIS — I499 Cardiac arrhythmia, unspecified: Secondary | ICD-10-CM | POA: Insufficient documentation

## 2015-09-17 DIAGNOSIS — I10 Essential (primary) hypertension: Secondary | ICD-10-CM | POA: Diagnosis not present

## 2015-09-17 DIAGNOSIS — H9190 Unspecified hearing loss, unspecified ear: Secondary | ICD-10-CM | POA: Insufficient documentation

## 2015-09-17 DIAGNOSIS — H9191 Unspecified hearing loss, right ear: Secondary | ICD-10-CM

## 2015-09-17 DIAGNOSIS — E669 Obesity, unspecified: Secondary | ICD-10-CM

## 2015-09-17 MED ORDER — LOSARTAN POTASSIUM 100 MG PO TABS: 100.0000 mg | ORAL_TABLET | Freq: Every day | ORAL | Status: DC

## 2015-09-17 MED ORDER — AMLODIPINE BESYLATE 10 MG PO TABS: ORAL_TABLET | ORAL | Status: DC

## 2015-09-17 NOTE — Progress Notes (Signed)
Pre visit review using our clinic review tool, if applicable. No additional management support is needed unless otherwise documented below in the visit note. 

## 2015-09-17 NOTE — Assessment & Plan Note (Signed)
Discussed how this problem influences overall health and the risks it imposes  Reviewed plan for weight loss with lower calorie diet (via better food choices and also portion control or program like weight watchers) and exercise building up to or more than 30 minutes 5 days per week including some aerobic activity    

## 2015-09-17 NOTE — Assessment & Plan Note (Signed)
No hearing in R ear for some time Pt declines w/u - stating she could not afford a hearing aide and thinks she gets by ok without one  Will let us know if she changes her mind

## 2015-09-17 NOTE — Telephone Encounter (Signed)
Pt request handicap placard; pt was seen 09/17/15. [place] do you have the handicap placard forms or will the pt need to pick up one at the license bureau. Pt request cb.

## 2015-09-17 NOTE — Assessment & Plan Note (Signed)
This is new w/o symptoms or RVR  Unsure how long she has had it  Will begin 81 mg asa QD Ref to cardiology for eval  asap

## 2015-09-17 NOTE — Assessment & Plan Note (Signed)
tsh is again high-disc beginning levothyroxine but not until a fib has been worked up (new incidental finding today) No symptoms  Pt is overwt

## 2015-09-17 NOTE — Patient Instructions (Addendum)
You have an irregular heart beat - called a fib Start an 81 mg aspirin over the counter with food once daily  Stop at check out to get an appointment with a cardiologist  I am going to hold off on thyroid medicine until we have the heart issue worked up  For cholesterol --Avoid red meat/ fried foods/ egg yolks/ fatty breakfast meats/ butter, cheese and high fat dairy/ and shellfish   For pre -diabetes-see handout on hyperglycemia/work on weight loss and eat low sugar diet  Since you were already on fosamax for 5 years - stop the fosamax (alendronate) Follow up with me in 6 months with labs prior  Take care of yourself

## 2015-09-17 NOTE — Assessment & Plan Note (Signed)
Rev dexa 4/16 Has already done 5 y of fosamax- inst her to stop this now Disc need for calcium/ vitamin D/ wt bearing exercise and bone density test every 2 y to monitor Disc safety/ fracture risk in detail   No falls or fractures

## 2015-09-17 NOTE — Assessment & Plan Note (Signed)
A fib with rate in 80s on EKG Ref to cardiology Asymptomatic

## 2015-09-17 NOTE — Assessment & Plan Note (Signed)
Lab Results  Component Value Date   HGBA1C 6.1 09/13/2015   Disc imp of wt loss and low glycemic diet to prevent DM2

## 2015-09-17 NOTE — Assessment & Plan Note (Signed)
Profile worse this time Disc goals for lipids and reasons to control them Rev labs with pt Rev low sat fat diet in detail She thinks she can improve her diet

## 2015-09-17 NOTE — Assessment & Plan Note (Signed)
bp in fair control at this time  BP Readings from Last 1 Encounters:  09/17/15 130/80   No changes needed Disc lifstyle change with low sodium diet and exercise   Labs reviewed

## 2015-09-17 NOTE — Progress Notes (Signed)
Subjective:    Patient ID: Linda Johnson, female    DOB: 11-Mar-1936, 80 y.o.   MRN: [id number]  HPI Here for f/u of chronic health problems   Reviewed AMW with Virl Axe  Has no hearing in R ear -she cannot afford a hearing aide  Declines further work up   Overall feels great  Working on a healthier diet-more vegetables    Mm 1/17 nl  Self breast exam no lumps or changes   dexa 4/16 OP hip  Has done 5 y of fosamax No falls or fractures  She is taking ca and D  For exercise - 30 minutes per day when it is warm enough outside and also does some PT exercises for her back   5/14 colonoscopy - 5 year recall on paper but pt states her GI told her she would not need another one - due to age   bp is stable today  No cp or palpitations or headaches or edema  No side effects to medicines  BP Readings from Last 3 Encounters:  09/17/15 140/70  09/13/15 132/80  09/11/14 138/78      Hypothyroidism  Pt has no clinical changes No change in energy level/ hair or skin/ edema and no tremor Lab Results  Component Value Date   TSH 6.52* 09/13/2015  will start low levothyroxine-not currently on it     Hyperglycemia Lab Results  Component Value Date   HGBA1C 6.1 09/13/2015   she likes sweets-tries not to eat a lot of them    Hyperlipidemia Lab Results  Component Value Date   CHOL 175 09/13/2015   CHOL 150 09/07/2014   CHOL 171 09/05/2013   Lab Results  Component Value Date   HDL 45.00 09/13/2015   HDL 44.90 09/07/2014   HDL 44.00 09/05/2013   Lab Results  Component Value Date   LDLCALC 104* 09/13/2015   LDLCALC 85 09/07/2014   LDLCALC 100* 09/05/2013   Lab Results  Component Value Date   TRIG 132.0 09/13/2015   TRIG 101.0 09/07/2014   TRIG 137.0 09/05/2013   Lab Results  Component Value Date   CHOLHDL 4 09/13/2015   CHOLHDL 3 09/07/2014   CHOLHDL 4 09/05/2013   Lab Results  Component Value Date   LDLDIRECT 132.4 05/06/2010   LDLDIRECT 107.9 04/02/2007      This went up  She has been eating more japanese food (out) lately   Patient Active Problem List   Diagnosis Date Noted  . Irregular heart rate 09/17/2015  . Atrial fibrillation (HCC) 09/17/2015  . Hearing loss 09/17/2015  . Hypothyroidism 12/12/2014  . Hyperglycemia 09/11/2014  . Encounter for Medicare annual wellness exam 09/11/2014  . Estrogen deficiency 09/11/2014  . Abnormal TSH 09/11/2014  . Pyogenic granuloma 06/06/2014  . Routine general medical examination at a health care facility 09/09/2013  . Low back pain 03/21/2013  . Other screening mammogram 04/19/2012  . Post-menopausal 04/19/2012  . Obesity 08/18/2011  . Hyperlipidemia 06/12/2010  . Osteoporosis 06/12/2010  . ALLERGIC RHINITIS CAUSE UNSPECIFIED 04/12/2008  . HX, PERSONAL, COLONIC POLYPS 04/02/2007  . STRABISMUS 03/02/2007  . Essential hypertension 03/02/2007   No past medical history on file. Past Surgical History  Procedure Laterality Date  . Breast biopsy Left 2011    2 areas, cysts per pt   Social History  Substance Use Topics  . Smoking status: Never Smoker   . Smokeless tobacco: Never Used  . Alcohol Use: No   Family History  Problem  Relation Age of Onset  . Breast cancer Maternal Aunt 50   Allergies  Allergen Reactions  . Ace Inhibitors     REACTION: cough  . Tramadol Hcl     nausea   Current Outpatient Prescriptions on File Prior to Visit  Medication Sig Dispense Refill  . calcium-vitamin D (OSCAL WITH D) 500-200 MG-UNIT per tablet Take 1 tablet by mouth 2 (two) times daily.    . Cholecalciferol (VITAMIN D3) 2000 UNITS TABS Take 1 tablet by mouth 2 (two) times daily.     . fish oil-omega-3 fatty acids 1000 MG capsule Take 1 g by mouth.     No current facility-administered medications on file prior to visit.     Review of Systems Review of Systems  Constitutional: Negative for fever, appetite change, fatigue and unexpected weight change.  Eyes: Negative for pain and visual  disturbance.  ENT pos for hearing loss in R ear Respiratory: Negative for cough and shortness of breath.   Cardiovascular: Negative for cp or palpitations    Gastrointestinal: Negative for nausea, diarrhea and constipation.  Genitourinary: Negative for urgency and frequency.  Skin: Negative for pallor or rash   Neurological: Negative for weakness, light-headedness, numbness and headaches.  Hematological: Negative for adenopathy. Does not bruise/bleed easily.  Psychiatric/Behavioral: Negative for dysphoric mood. The patient is not nervous/anxious.         Objective:   Physical Exam  Constitutional: She appears well-developed and well-nourished. No distress.  obese and well appearing   HENT:  Head: Normocephalic and atraumatic.  Right Ear: External ear normal.  Left Ear: External ear normal.  Nose: Nose normal.  Mouth/Throat: Oropharynx is clear and moist.  Eyes: Conjunctivae and EOM are normal. Pupils are equal, round, and reactive to light. Right eye exhibits no discharge. Left eye exhibits no discharge. No scleral icterus.  Baseline strabismus   Neck: Normal range of motion. Neck supple. No JVD present. Carotid bruit is not present. No thyromegaly present.  Cardiovascular: Normal rate, normal heart sounds and intact distal pulses.  Exam reveals no gallop.   Irregularly irregular rhythm   Pulmonary/Chest: Effort normal and breath sounds normal. No respiratory distress. She has no wheezes. She has no rales.  Abdominal: Soft. Bowel sounds are normal. She exhibits no distension and no mass. There is no tenderness.  Musculoskeletal: She exhibits no edema or tenderness.  No kyphosis   Lymphadenopathy:    She has no cervical adenopathy.  Neurological: She is alert. She has normal reflexes. She displays no tremor. No cranial nerve deficit. She exhibits normal muscle tone. Coordination normal.  Skin: Skin is warm and dry. No rash noted. No erythema. No pallor.  Psychiatric: She has a  normal mood and affect.          Assessment & Plan:   Problem List Items Addressed This Visit      Cardiovascular and Mediastinum   Atrial fibrillation (HCC) - Primary    This is new w/o symptoms or RVR  Unsure how long she has had it  Will begin 81 mg asa QD Ref to cardiology for eval  asap       Relevant Medications   losartan (COZAAR) 100 MG tablet   amLODipine (NORVASC) 10 MG tablet   Other Relevant Orders   Ambulatory referral to Cardiology   Essential hypertension    bp in fair control at this time  BP Readings from Last 1 Encounters:  09/17/15 130/80   No changes needed Disc lifstyle change  with low sodium diet and exercise   Labs reviewed       Relevant Medications   losartan (COZAAR) 100 MG tablet   amLODipine (NORVASC) 10 MG tablet     Endocrine   Hypothyroidism    tsh is again high-disc beginning levothyroxine but not until a fib has been worked up (new incidental finding today) No symptoms  Pt is overwt         Nervous and Auditory   Hearing loss    No hearing in R ear for some time Pt declines w/u - stating she could not afford a hearing aide and thinks she gets by ok without one  Will let us know if she changes her mind         Musculoskeletal and Integument   Osteoporosis    Rev dexa 4/16 Has already done 5 y of fosamax- inst her to stop this now Disc need for calcium/ vitamin D/ wt bearing exercise and bone density test every 2 y to monitor Disc safety/ fracture risk in detail   No falls or fractures         Other   Hyperglycemia    Lab Results  Component Value Date   HGBA1C 6.1 09/13/2015   Disc imp of wt loss and low glycemic diet to prevent DM2      Hyperlipidemia    Profile worse this time Disc goals for lipids and reasons to control them Rev labs with pt Rev low sat fat diet in detail She thinks she can improve her diet       Relevant Medications   losartan (COZAAR) 100 MG tablet   amLODipine (NORVASC) 10 MG  tablet   Irregular heart rate    A fib with rate in 80s on EKG Ref to cardiology Asymptomatic       Relevant Orders   EKG 12-Lead (Completed)   Obesity    Discussed how this problem influences overall health and the risks it imposes  Reviewed plan for weight loss with lower calorie diet (via better food choices and also portion control or program like weight watchers) and exercise building up to or more than 30 minutes 5 days per week including some aerobic activity

## 2015-09-18 NOTE — Telephone Encounter (Signed)
See prev note, handicap placard form (that I had) filled out and placed in your inbox

## 2015-09-18 NOTE — Telephone Encounter (Signed)
Done and in IN box 

## 2015-09-18 NOTE — Telephone Encounter (Signed)
Pt notified form ready for pick up 

## 2015-09-20 ENCOUNTER — Ambulatory Visit (INDEPENDENT_AMBULATORY_CARE_PROVIDER_SITE_OTHER): Payer: Medicare Other | Admitting: Cardiovascular Disease

## 2015-09-20 ENCOUNTER — Encounter: Payer: Self-pay | Admitting: Cardiovascular Disease

## 2015-09-20 VITALS — BP 150/64 | HR 114 | Ht 65.0 in | Wt 190.5 lb

## 2015-09-20 DIAGNOSIS — I499 Cardiac arrhythmia, unspecified: Secondary | ICD-10-CM

## 2015-09-20 DIAGNOSIS — E669 Obesity, unspecified: Secondary | ICD-10-CM

## 2015-09-20 DIAGNOSIS — I481 Persistent atrial fibrillation: Secondary | ICD-10-CM | POA: Diagnosis not present

## 2015-09-20 DIAGNOSIS — I10 Essential (primary) hypertension: Secondary | ICD-10-CM

## 2015-09-20 DIAGNOSIS — I4819 Other persistent atrial fibrillation: Secondary | ICD-10-CM

## 2015-09-20 DIAGNOSIS — Z7189 Other specified counseling: Secondary | ICD-10-CM | POA: Diagnosis not present

## 2015-09-20 MED ORDER — WARFARIN SODIUM 5 MG PO TABS: 5.0000 mg | ORAL_TABLET | Freq: Every day | ORAL | Status: DC

## 2015-09-20 MED ORDER — DILTIAZEM HCL ER COATED BEADS 240 MG PO CP24: 240.0000 mg | ORAL_CAPSULE | Freq: Every day | ORAL | Status: DC

## 2015-09-20 NOTE — Progress Notes (Signed)
Patient ID: TYREA FROBERG, female    DOB: 1936-02-22, 80 y.o.   MRN: [id number]  HPI Comments: Ms. Biller is a 80 year old woman who presents by referral from Dr. Milinda Antis for evaluation of atrial fibrillation. She is new to the [facility] cardiology clinic  She reports that she developed shortness of breath approximately one year ago. Family, including her daughters reported that she was more short of breath on exertion and talking on the phone. She does have some leg swelling but this has not been significant She is not very active at baseline, walks with a cane She lives alone, does all of her ADLs  She denies any dramatic change in the way that she feels, Was seen for routine visit with Dr. Milinda Antis, EKG documenting atrial fibrillation. She is very anxious on today's visit.  She reports no new changes to her medications,  She does not check her blood pressure at home, no blood pressure cuff to use  EKG on today's visit shows atrial fibrillation with ventricular rate 114 bpm, nonspecific ST abnormality   Allergies  Allergen Reactions  . Ace Inhibitors     REACTION: cough  . Tramadol Hcl     nausea      Medication List       This list is accurate as of: 09/20/15 11:57 AM.  Always use your most recent med list.               calcium-vitamin D 500-200 MG-UNIT tablet  Commonly known as:  OSCAL WITH D  Take 1 tablet by mouth 2 (two) times daily.     Amlodipine   10 mg daily        fish oil-omega-3 fatty acids 1000 MG capsule  Take 1 g by mouth.     losartan 100 MG tablet  Commonly known as:  COZAAR  Take 1 tablet (100 mg total) by mouth daily.     Vitamin D3 2000 units Tabs  Take 1 tablet by mouth 2 (two) times daily.                 Past Medical History  Diagnosis Date  . Hypertension     Past Surgical History  Procedure Laterality Date  . Breast biopsy Left 2011    2 areas, cysts per pt    Social History  reports that she has never smoked. She  has never used smokeless tobacco. She reports that she does not drink alcohol or use illicit drugs.  Family History family history includes Breast cancer (age of onset: 25) in her maternal aunt.   Review of Systems  Constitutional: Negative.   HENT: Negative.   Respiratory: Positive for shortness of breath.   Cardiovascular: Negative.   Gastrointestinal: Negative.   Musculoskeletal: Positive for gait problem.  Neurological: Negative.   Hematological: Negative.   Psychiatric/Behavioral: The patient is nervous/anxious.   All other systems reviewed and are negative.   BP 150/64 mmHg  Pulse 114  Ht  (1.651 m)  Wt 190 lb 8 oz (86.41 kg)  BMI 31.70 kg/m2  Physical Exam  Constitutional: She is oriented to person, place, and time. She appears well-developed and well-nourished.  HENT:  Head: Normocephalic.  Nose: Nose normal.  Mouth/Throat: Oropharynx is clear and moist.  Eyes: Conjunctivae are normal. Pupils are equal, round, and reactive to light.  Neck: Normal range of motion. Neck supple. No JVD present.  Cardiovascular: Normal heart sounds and intact distal pulses.  An irregularly irregular rhythm  present. Tachycardia present.  Exam reveals no gallop and no friction rub.   No murmur heard. Pulmonary/Chest: Effort normal and breath sounds normal. No respiratory distress. She has no wheezes. She has no rales. She exhibits no tenderness.  Abdominal: Soft. Bowel sounds are normal. She exhibits no distension. There is no tenderness.  Musculoskeletal: Normal range of motion. She exhibits no edema or tenderness.  Lymphadenopathy:    She has no cervical adenopathy.  Neurological: She is alert and oriented to person, place, and time. Coordination normal.  Skin: Skin is warm and dry. No rash noted. No erythema.  Psychiatric: She has a normal mood and affect. Her behavior is normal. Judgment and thought content normal.

## 2015-09-20 NOTE — Assessment & Plan Note (Signed)
Heart rate elevated on today's visit, persistent atrial fibrillation on EKG She is minimally symptomatic, possibly some shortness of breath on exertion. Recommended she stop the amlodipine, Will start Cardizem/diltiazem 240 mg daily for heart rate control and blood pressure control Recommended she stop aspirin We will start warfarin,  We have called [facility] to schedule anticoagulation appointment. She prefers to [facility] this at the [facility] for convenience of location and cost reasons.  Echocardiogram has been ordered, if no significant structural abnormalities including valvular disease, potentially could consider restoring normal sinus rhythm. We'll reevaluate in several weeks' time and determine if she is symptomatic. If she has been in atrial fibrillation for a long time as suspected, it may be difficult to restore normal sinus rhythm

## 2015-09-20 NOTE — Assessment & Plan Note (Addendum)
We have encouraged continued exercise, careful diet management in an effort to lose weight.   Total encounter time more than 45 minutes  Greater than 50% was spent in counseling and coordination of care with the patient  

## 2015-09-20 NOTE — Assessment & Plan Note (Signed)
Medication changes as above We'll stop the amlodipine, start Cardizem She does not have a blood pressure cuff at home to help with heart rate and blood pressure control

## 2015-09-20 NOTE — Patient Instructions (Addendum)
You are in atrial fibrillation  Please stop the amlodipine Start diltiazem one pill a day to control the heart speed  Please start warfarin one a day to prevent stroke  We will schedule an echocardiogram for atrial fibrillation  Please call us if you have new issues that need to be addressed before your next appt.  Your physician wants you to follow-up in: 6 weeks    Coumadin check on Monday 27, 2017 at 09:00 AM at [facility] office    Your physician has requested that you have an echocardiogram. Echocardiography is a painless test that uses sound waves to create images of your heart. It provides your doctor with information about the size and shape of your heart and how well your heart's chambers and valves are working. This procedure takes approximately one hour. There are no restrictions for this procedure.  Date & Time:_____________________________________________________________________  Echocardiogram An echocardiogram, or echocardiography, uses sound waves (ultrasound) to produce an image of your heart. The echocardiogram is simple, painless, obtained within a short period of time, and offers valuable information to your health care provider. The images from an echocardiogram can provide information such as:  Evidence of coronary artery disease (CAD).  Heart size.  Heart muscle function.  Heart valve function.  Aneurysm detection.  Evidence of a past heart attack.  Fluid buildup around the heart.  Heart muscle thickening.  Assess heart valve function. LET [facility] CARE PROVIDER KNOW ABOUT:  Any allergies you have.  All medicines you are taking, including vitamins, herbs, eye drops, creams, and over-the-counter medicines.  Previous problems you or members of your family have had with the use of anesthetics.  Any blood disorders you have.  Previous surgeries you have had.  Medical conditions you have.  Possibility of pregnancy, if this  applies. BEFORE THE PROCEDURE  No special preparation is needed. Eat and drink normally.  PROCEDURE   In order to produce an image of your heart, gel will be applied to your chest and a wand-like tool (transducer) will be moved over your chest. The gel will help transmit the sound waves from the transducer. The sound waves will harmlessly bounce off your heart to allow the heart images to be captured in real-time motion. These images will then be recorded.  You may need an IV to receive a medicine that improves the quality of the pictures. AFTER THE PROCEDURE You may return to your normal schedule including diet, activities, and medicines, unless your health care provider tells you otherwise.   This information is not intended to replace advice given to you by your health care provider. Make sure you discuss any questions you have with your health care provider.   Document Released: 06/13/2000 Document Revised: 07/07/2014 Document Reviewed: 02/21/2013 Elsevier Interactive Patient Education 2016 Elsevier Inc.    Atrial Fibrillation Atrial fibrillation is a type of heartbeat that is irregular or fast (rapid). If you have this condition, your heart keeps quivering in a weird (chaotic) way. This condition can make it so your heart cannot pump blood normally. Having this condition gives a person more risk for stroke, heart failure, and other heart problems. There are different types of atrial fibrillation. Talk with your doctor to learn about the type that you have. HOME CARE  Take over-the-counter and prescription medicines only as told by your doctor.  If your doctor prescribed a blood-thinning medicine, take it exactly as told. Taking too much of it can cause bleeding. If you do not take  enough of it, you will not have the protection that you need against stroke and other problems.  Do not use any tobacco products. These include cigarettes, chewing tobacco, and e-cigarettes. If you need help  quitting, ask your doctor.  If you have apnea (obstructive sleep apnea), manage it as told by your doctor.  Do not drink alcohol.  Do not drink beverages that have caffeine. These include coffee, soda, and tea.  Maintain a healthy weight. Do not use diet pills unless your doctor says they are safe for you. Diet pills may make heart problems worse.  Follow diet instructions as told by your doctor.  Exercise regularly as told by your doctor.  Keep all follow-up visits as told by your doctor. This is important. GET HELP IF:  You notice a change in the speed, rhythm, or strength of your heartbeat.  You are taking a blood-thinning medicine and you notice more bruising.  You get tired more easily when you move or exercise. GET HELP RIGHT AWAY IF:  You have pain in your chest or your belly (abdomen).  You have sweating or weakness.  You feel sick to your stomach (nauseous).  You notice blood in your throw up (vomit), poop (stool), or pee (urine).  You are short of breath.  You suddenly have swollen feet and ankles.  You feel dizzy.  Your suddenly get weak or numb in your face, arms, or legs, especially if it happens on one side of your body.  You have trouble talking, trouble understanding, or both.  Your face or your eyelid droops on one side. These symptoms may be an emergency. Do not wait to see if the symptoms will go away. Get medical help right away. Call your local emergency services (911 in the [place] Do not drive yourself to the hospital.   This information is not intended to replace advice given to you by your health care provider. Make sure you discuss any questions you have with your health care provider.   Document Released: 03/25/2008 Document Revised: 03/07/2015 Document Reviewed: 10/11/2014 Elsevier Interactive Patient Education [employer] Elsevier Inc.

## 2015-09-20 NOTE — Assessment & Plan Note (Addendum)
Long discussion concerning options for anticoagulation She does not want anything that is not generic Recommended she start warfarin 5 mg daily with close follow-up in Coumadin clinic next week  CHADS VASC is 4

## 2015-09-24 ENCOUNTER — Ambulatory Visit (INDEPENDENT_AMBULATORY_CARE_PROVIDER_SITE_OTHER): Payer: Medicare Other | Admitting: *Deleted

## 2015-09-24 DIAGNOSIS — I481 Persistent atrial fibrillation: Secondary | ICD-10-CM | POA: Diagnosis not present

## 2015-09-24 DIAGNOSIS — Z5181 Encounter for therapeutic drug level monitoring: Secondary | ICD-10-CM | POA: Diagnosis not present

## 2015-09-24 DIAGNOSIS — I4819 Other persistent atrial fibrillation: Secondary | ICD-10-CM

## 2015-09-24 LAB — POCT INR: INR: 3.1

## 2015-09-24 NOTE — Progress Notes (Signed)
Patient is here today to establish at our Coumadin clinic.  She was started on Coumadin 5 mg daily on 09/20/15 for treatment of A-fib.  This was initiated by Dr. Mariah MillingGollan.  INR is supra therapeutic today.  Patient reports that she has been taking Coumadin at the same time daily with no missed or extra doses.  We discussed in detail appropriate diet while on Coumadin and the need to remain consistent with greens - handout was given to the patient and she verbalized understanding.  Will adjust Coumadin to 5 mg daily except 2.5 mg on Mondays and Fridays.  Recheck is scheduled for 1 week.  Will make additional adjustments at that time if needed.

## 2015-09-24 NOTE — Progress Notes (Signed)
Pre visit review using our clinic review tool, if applicable. No additional management support is needed unless otherwise documented below in the visit note. 

## 2015-10-01 ENCOUNTER — Ambulatory Visit (INDEPENDENT_AMBULATORY_CARE_PROVIDER_SITE_OTHER): Payer: Medicare Other | Admitting: *Deleted

## 2015-10-01 DIAGNOSIS — I481 Persistent atrial fibrillation: Secondary | ICD-10-CM | POA: Diagnosis not present

## 2015-10-01 DIAGNOSIS — I4819 Other persistent atrial fibrillation: Secondary | ICD-10-CM

## 2015-10-01 DIAGNOSIS — Z5181 Encounter for therapeutic drug level monitoring: Secondary | ICD-10-CM

## 2015-10-01 LAB — POCT INR: INR: 5.8

## 2015-10-01 NOTE — Progress Notes (Signed)
Pre visit review using our clinic review tool, if applicable. No additional management support is needed unless otherwise documented below in the visit note. 

## 2015-10-01 NOTE — Progress Notes (Signed)
INR remains supra therapeutic despite recent decrease in dose.  Patient denies any extra doses, new medications, or diet changes.  Will hold Coumadin x2 days (she has already taken today's dose), then decrease dose by 20%.  She will begin taking 2.5 mg daily except 5 mg on Mondays, Wednesdays, and Fridays.  She was encouraged to eat a good serving of greens over the next few days.  She will call me with any signs of bleeding.  Will recheck in 1 week and make additional adjustments at that time if needed.

## 2015-10-08 ENCOUNTER — Ambulatory Visit (INDEPENDENT_AMBULATORY_CARE_PROVIDER_SITE_OTHER): Payer: Medicare Other

## 2015-10-08 ENCOUNTER — Ambulatory Visit (INDEPENDENT_AMBULATORY_CARE_PROVIDER_SITE_OTHER): Payer: Medicare Other | Admitting: *Deleted

## 2015-10-08 ENCOUNTER — Other Ambulatory Visit: Payer: Self-pay

## 2015-10-08 ENCOUNTER — Other Ambulatory Visit: Payer: Medicare Other

## 2015-10-08 DIAGNOSIS — Z5181 Encounter for therapeutic drug level monitoring: Secondary | ICD-10-CM | POA: Diagnosis not present

## 2015-10-08 DIAGNOSIS — I4891 Unspecified atrial fibrillation: Secondary | ICD-10-CM

## 2015-10-08 DIAGNOSIS — I481 Persistent atrial fibrillation: Secondary | ICD-10-CM

## 2015-10-08 DIAGNOSIS — I4819 Other persistent atrial fibrillation: Secondary | ICD-10-CM

## 2015-10-08 DIAGNOSIS — I499 Cardiac arrhythmia, unspecified: Secondary | ICD-10-CM

## 2015-10-08 LAB — POCT INR: INR: 2.8

## 2015-10-08 NOTE — Progress Notes (Signed)
Pre visit review using our clinic review tool, if applicable. No additional management support is needed unless otherwise documented below in the visit note. 

## 2015-10-15 ENCOUNTER — Other Ambulatory Visit: Payer: Self-pay | Admitting: Cardiovascular Disease

## 2015-10-15 ENCOUNTER — Ambulatory Visit (INDEPENDENT_AMBULATORY_CARE_PROVIDER_SITE_OTHER): Payer: Medicare Other | Admitting: *Deleted

## 2015-10-15 DIAGNOSIS — I481 Persistent atrial fibrillation: Secondary | ICD-10-CM

## 2015-10-15 DIAGNOSIS — Z5181 Encounter for therapeutic drug level monitoring: Secondary | ICD-10-CM | POA: Diagnosis not present

## 2015-10-15 DIAGNOSIS — I4819 Other persistent atrial fibrillation: Secondary | ICD-10-CM

## 2015-10-15 LAB — POCT INR: INR: 3.8

## 2015-10-15 NOTE — Progress Notes (Signed)
INR is elevated today.  Patient denies any extra doses, new medications, or diet changes.  She has already taken Coumadin today, so will have her skip tomorrow.  She will begin taking half a tablet daily and one tablet on Mondays and Fridays only.  Patient was encouraged to eat a good serving of greens for the next 2-3 days.  Will recheck in 2 weeks and make additional adjustments at that time if needed.

## 2015-10-15 NOTE — Telephone Encounter (Signed)
Please review for refill. Thanks!  

## 2015-10-15 NOTE — Progress Notes (Signed)
Pre visit review using our clinic review tool, if applicable. No additional management support is needed unless otherwise documented below in the visit note. 

## 2015-10-16 ENCOUNTER — Telehealth: Payer: Self-pay | Admitting: Cardiovascular Disease

## 2015-10-16 ENCOUNTER — Other Ambulatory Visit: Payer: Medicare Other

## 2015-10-16 NOTE — Telephone Encounter (Signed)
Pt calling asking about her Echo results that she did last week Please call when ready.

## 2015-10-16 NOTE — Telephone Encounter (Signed)
I don't see this in your basket. It hasn't been resulted yet.

## 2015-10-17 NOTE — Telephone Encounter (Signed)
Reviewed echo results with patient and she verbalized understanding and had no further questions at this time. She also verified that she has an upcoming appointment on 11/07/15 at 11:00am. Let her know to call back if she has any further questions or problems.

## 2015-10-17 NOTE — Telephone Encounter (Signed)
Echocardiogram result  Essentially normal study  Details below   Left ventricle: The cavity size was normal. Systolic function was  normal. The estimated ejection fraction was in the range of 60%  to 65%. Wall motion was normal; there were no regional wall  motion abnormalities. The study is not technically sufficient to  allow evaluation of LV diastolic function. - Aortic valve: There was trivial regurgitation. - Left atrium: The atrium was normal in size. - Right ventricle: Systolic function was normal. - Pulmonary arteries: Systolic pressure was within the normal  range.  Impressions:  - Rhythm is atrial fibrillation.

## 2015-10-29 ENCOUNTER — Ambulatory Visit (INDEPENDENT_AMBULATORY_CARE_PROVIDER_SITE_OTHER): Payer: Medicare Other | Admitting: *Deleted

## 2015-10-29 DIAGNOSIS — I4819 Other persistent atrial fibrillation: Secondary | ICD-10-CM

## 2015-10-29 DIAGNOSIS — I481 Persistent atrial fibrillation: Secondary | ICD-10-CM | POA: Diagnosis not present

## 2015-10-29 DIAGNOSIS — Z5181 Encounter for therapeutic drug level monitoring: Secondary | ICD-10-CM

## 2015-10-29 LAB — POCT INR: INR: 3.9

## 2015-10-29 NOTE — Progress Notes (Signed)
Pre visit review using our clinic review tool, if applicable. No additional management support is needed unless otherwise documented below in the visit note. 

## 2015-10-29 NOTE — Progress Notes (Signed)
INR remains supra therapeutic despite recent decrease in dose.  Patient denies any extra doses, new medications, or diet changes.  Will hold dose tomorrow as she has already taken Coumadin today.  Patient instructed to restart Coumadin on Wednesday 5/3 - half a tablet daily and one whole tablet on Mondays only.  Encouraged her to eat a good serving of greens for the next 2-3 days.  Will recheck in 2 weeks and make additional adjustments at that time if needed.

## 2015-11-07 ENCOUNTER — Ambulatory Visit (INDEPENDENT_AMBULATORY_CARE_PROVIDER_SITE_OTHER): Payer: Medicare Other | Admitting: Cardiovascular Disease

## 2015-11-07 ENCOUNTER — Telehealth: Payer: Self-pay | Admitting: Cardiovascular Disease

## 2015-11-07 ENCOUNTER — Encounter: Payer: Self-pay | Admitting: Cardiovascular Disease

## 2015-11-07 VITALS — BP 176/100 | HR 100 | Ht 68.0 in | Wt 187.0 lb

## 2015-11-07 DIAGNOSIS — I4819 Other persistent atrial fibrillation: Secondary | ICD-10-CM

## 2015-11-07 DIAGNOSIS — I10 Essential (primary) hypertension: Secondary | ICD-10-CM

## 2015-11-07 DIAGNOSIS — I481 Persistent atrial fibrillation: Secondary | ICD-10-CM

## 2015-11-07 DIAGNOSIS — Z7189 Other specified counseling: Secondary | ICD-10-CM | POA: Diagnosis not present

## 2015-11-07 MED ORDER — AMIODARONE HCL 200 MG PO TABS: 200.0000 mg | ORAL_TABLET | Freq: Two times a day (BID) | ORAL | Status: DC

## 2015-11-07 MED ORDER — DILTIAZEM HCL ER COATED BEADS 120 MG PO CP24: 120.0000 mg | ORAL_CAPSULE | Freq: Every day | ORAL | Status: DC

## 2015-11-07 NOTE — Assessment & Plan Note (Signed)
Long discussion concerning her anticoagulation. Restarting amiodarone, she will need close surveillance, at least weekly, of her INR   Total encounter time more than 25 minutes  Greater than 50% was spent in counseling and coordination of care with the patient

## 2015-11-07 NOTE — Assessment & Plan Note (Signed)
Blood pressure is elevated today Recommended that she take additional Cardizem 120 mg in the evening, continue on her 240 mg dose in the morning. This will help both blood pressure and heart rate control

## 2015-11-07 NOTE — Telephone Encounter (Signed)
Noted.  Will address at 5/15 appointment.  Coumadin to be adjusted as needed.

## 2015-11-07 NOTE — Telephone Encounter (Signed)
Spoke w/ Dorathy DaftKayla.  Advised her that Dr. Mariah MillingGollan is aware that pt is on coumadin and he started pt on amiodarone today.  Pt is sched for coumadin check on Monday @ [facility]. Will make Gina aware of pt's med change.

## 2015-11-07 NOTE — Progress Notes (Signed)
Patient ID: Linda CousinsLinda R Johnson, female    DOB: 10/01/1935, 80 y.o.   MRN: [id number]  HPI Comments: Linda Johnson is a 80 year old woman who  Is a patient of Dr. Milinda Antisower who presents for routine follow-up of her atrial fibrillation  In follow-up today, she reports that she feels well, no complaints Denies any tachycardia or palpitations Occasionally has a sense of shortness of breath No leg edema, no significant weight change per the patient Seems to be sleeping well Relatively sedentary, no exercise program She is not very active at baseline, walks with a cane She lives alone, does all of her ADLs She has not been checking her blood pressure at home  EKG on today's visit shows atrial fibrillation with ventricular rate 100 bpm, rare PVCs, incomplete right bundle branch block, nonspecific ST abnormality  Other past medical history reviewed  developed shortness of breath approximately one year ago. Family, including her daughters reported that she was more short of breath on exertion and talking on the phone. She does have some leg swelling but this has not been significant  Atrial fibrillation picked up by primary care on routine EKG early 2017     Allergies  Allergen Reactions  . Ace Inhibitors     REACTION: cough  . Tramadol Hcl     nausea      Medication List       This list is accurate as of: 11/07/15  1:14 PM.  Always use your most recent med list.                  calcium-vitamin D 500-200 MG-UNIT tablet  Commonly known as:  OSCAL WITH D  Take 1 tablet by mouth 2 (two) times daily.     diltiazem 240 MG 24 hr capsule  Commonly known as:  CARDIZEM CD  Take 1 capsule (240 mg total) by mouth daily.        fish oil-omega-3 fatty acids 1000 MG capsule  Take 1 g by mouth.     losartan 100 MG tablet  Commonly known as:  COZAAR  Take 1 tablet (100 mg total) by mouth daily.     Vitamin D3 2000 units Tabs  Take 1 tablet by mouth 2 (two) times daily.     warfarin 5 MG  tablet  Commonly known as:  COUMADIN  Take as directed by anti-coagulation clinic.        Past Medical History  Diagnosis Date  . Hypertension     Past Surgical History  Procedure Laterality Date  . Breast biopsy Left 2011    2 areas, cysts per pt    Social History  reports that she has never smoked. She has never used smokeless tobacco. She reports that she does not drink alcohol or use illicit drugs.  Family History family history includes Breast cancer (age of onset: 3450) in her maternal aunt.   Review of Systems  Constitutional: Negative.   HENT: Negative.   Respiratory: Positive for shortness of breath.   Cardiovascular: Negative.   Gastrointestinal: Negative.   Musculoskeletal: Positive for gait problem.  Neurological: Negative.   Hematological: Negative.   Psychiatric/Behavioral: Negative.   All other systems reviewed and are negative.   BP 176/100 mmHg  Pulse 100  Ht 5\' 8"  (1.727 m)  Wt 187 lb (84.823 kg)  BMI 28.44 kg/m2 Repeat blood pressures with systolic 170 Physical Exam  Constitutional: She is oriented to person, place, and time. She appears well-developed and well-nourished.  HENT:  Head: Normocephalic.  Nose: Nose normal.  Mouth/Throat: Oropharynx is clear and moist.  Eyes: Conjunctivae are normal. Pupils are equal, round, and reactive to light.  Neck: Normal range of motion. Neck supple. No JVD present.  Cardiovascular: Normal heart sounds and intact distal pulses.  An irregularly irregular rhythm present. Tachycardia present.  Exam reveals no gallop and no friction rub.   No murmur heard. Pulmonary/Chest: Effort normal and breath sounds normal. No respiratory distress. She has no wheezes. She has no rales. She exhibits no tenderness.  Abdominal: Soft. Bowel sounds are normal. She exhibits no distension. There is no tenderness.  Musculoskeletal: Normal range of motion. She exhibits no edema or tenderness.  Lymphadenopathy:    She has no  cervical adenopathy.  Neurological: She is alert and oriented to person, place, and time. Coordination normal.  Skin: Skin is warm and dry. No rash noted. No erythema.  Psychiatric: She has a normal mood and affect. Her behavior is normal. Judgment and thought content normal.

## 2015-11-07 NOTE — Patient Instructions (Addendum)
You are doing well.  Please start amiodarone 400 mg twice a day for 5 days (2 pills in the AM and 2 pills in the PM) Then decrease down to 200 mg twice a day (one pill in the Am and PM) Appt with Randale Carvalho in 2 weeks for EKG,  Determine if cardioversion is needed   Please start cardizem/diltiazem 120 mg dose in the PM Stay on the cardizem/diltiazem 240 mg dose  in the AM  Please call us if you have new issues that need to be addressed before your next appt.  Your physician wants you to follow-up in: 2 weeks

## 2015-11-07 NOTE — Assessment & Plan Note (Signed)
Long discussion today with her concerning the various treatment options for her atrial fibrillation. Although she is only mildly symptomatic in terms of her shortness of breath, she would like to restore normal sinus rhythm. Recommended she start amiodarone 400 mg twice a day for 5 days then down to 200 mg twice a day. Repeat EKG an appointment in 2 weeks' time

## 2015-11-07 NOTE — Telephone Encounter (Signed)
Pharmacist calling regarding rx for Amiodarone, states pt is on Warfarin. Please call.

## 2015-11-12 ENCOUNTER — Ambulatory Visit (INDEPENDENT_AMBULATORY_CARE_PROVIDER_SITE_OTHER): Payer: Medicare Other | Admitting: *Deleted

## 2015-11-12 DIAGNOSIS — Z5181 Encounter for therapeutic drug level monitoring: Secondary | ICD-10-CM | POA: Diagnosis not present

## 2015-11-12 DIAGNOSIS — I4819 Other persistent atrial fibrillation: Secondary | ICD-10-CM

## 2015-11-12 DIAGNOSIS — I481 Persistent atrial fibrillation: Secondary | ICD-10-CM | POA: Diagnosis not present

## 2015-11-12 LAB — POCT INR: INR: 3.3

## 2015-11-12 NOTE — Progress Notes (Signed)
Pre visit review using our clinic review tool, if applicable. No additional management support is needed unless otherwise documented below in the visit note.  INR remains supra therapeutic today despite recent decrease in dose.  Patient denies any extra doses or diet changes.  Cardiology did add amiodarone 200 mg bid on 5/10.  Patient instructed to hold Coumadin tomorrow (she has already taken Coumadin today), then begin taking half a tablet daily.  Will recheck in 2 weeks and make additional adjustments at that time if needed.

## 2015-11-20 ENCOUNTER — Ambulatory Visit: Payer: Medicare Other | Admitting: Cardiovascular Disease

## 2015-11-23 ENCOUNTER — Encounter: Payer: Self-pay | Admitting: Cardiovascular Disease

## 2015-11-23 ENCOUNTER — Ambulatory Visit (INDEPENDENT_AMBULATORY_CARE_PROVIDER_SITE_OTHER): Payer: Medicare Other | Admitting: Cardiovascular Disease

## 2015-11-23 ENCOUNTER — Ambulatory Visit (INDEPENDENT_AMBULATORY_CARE_PROVIDER_SITE_OTHER): Payer: Medicare Other

## 2015-11-23 VITALS — BP 170/64 | HR 94 | Ht 65.0 in | Wt 186.5 lb

## 2015-11-23 DIAGNOSIS — E669 Obesity, unspecified: Secondary | ICD-10-CM

## 2015-11-23 DIAGNOSIS — Z7189 Other specified counseling: Secondary | ICD-10-CM

## 2015-11-23 DIAGNOSIS — Z5181 Encounter for therapeutic drug level monitoring: Secondary | ICD-10-CM

## 2015-11-23 DIAGNOSIS — I4891 Unspecified atrial fibrillation: Secondary | ICD-10-CM | POA: Diagnosis not present

## 2015-11-23 DIAGNOSIS — I481 Persistent atrial fibrillation: Secondary | ICD-10-CM

## 2015-11-23 DIAGNOSIS — I4819 Other persistent atrial fibrillation: Secondary | ICD-10-CM

## 2015-11-23 DIAGNOSIS — I10 Essential (primary) hypertension: Secondary | ICD-10-CM

## 2015-11-23 LAB — POCT INR: INR: 4

## 2015-11-23 MED ORDER — DOXAZOSIN MESYLATE 4 MG PO TABS
4.0000 mg | ORAL_TABLET | Freq: Every day | ORAL | Status: DC
Start: 2015-11-23 — End: 2016-02-26

## 2015-11-23 NOTE — Progress Notes (Signed)
Patient ID: Linda Johnson, female    DOB: 15-Aug-1935, 80 y.o.   MRN: [id number]  HPI Comments: Linda Johnson is a 80 year old woman who  Is a patient of Dr. Milinda Johnson who presents for routine follow-up of her atrial fibrillation  On her last clinic visit, she was started on amiodarone in an attempt to cardiovert In follow-up today, she reports that she feels well with no complaints Denies any significant shortness of breath, no fatigue, no leg edema, orthopnea or PND  She has not been checking her blood pressure at home Initial blood pressure 170/76, improving to 160/70 on my check  She recently called with symptoms of nausea, feels it was from the amiodarone. This was decreased down to 200 mg daily INR checked today 4.0  Long discussion concerning her atrial fibrillation. She is not interested in cardioversion  EKG on today's visit shows atrial fibrillation with ventricular rate 94 bpm, incomplete right bundle branch block, nonspecific ST abnormality  Other past medical history reviewed  developed shortness of breath approximately one year ago. Family, including her daughters reported that she was more short of breath on exertion and talking on the phone. She does have some leg swelling but this has not been significant  Atrial fibrillation picked up by primary care on routine EKG early 2017     Allergies  Allergen Reactions  . Ace Inhibitors     REACTION: cough  . Tramadol Hcl     nausea      Medication List       This list is accurate as of: 11/07/15  1:14 PM.  Always use your most recent med list.                  calcium-vitamin D 500-200 MG-UNIT tablet  Commonly known as:  OSCAL WITH D  Take 1 tablet by mouth 2 (two) times daily.     diltiazem 240 MG 24 hr capsule  Commonly known as:  CARDIZEM CD  Take 1 capsule (240 mg total) by mouth daily.        fish oil-omega-3 fatty acids 1000 MG capsule  Take 1 g by mouth.     losartan 100 MG tablet  Commonly known  as:  COZAAR  Take 1 tablet (100 mg total) by mouth daily.     Vitamin D3 2000 units Tabs  Take 1 tablet by mouth 2 (two) times daily.     warfarin 5 MG tablet  Commonly known as:  COUMADIN  Take as directed by anti-coagulation clinic.        Past Medical History  Diagnosis Date  . Hypertension     Past Surgical History  Procedure Laterality Date  . Breast biopsy Left 2011    2 areas, cysts per pt    Social History  reports that she has never smoked. She has never used smokeless tobacco. She reports that she does not drink alcohol or use illicit drugs.  Family History family history includes Breast cancer (age of onset: 59) in her maternal aunt.   Review of Systems  Constitutional: Negative.   Respiratory: Negative.   Cardiovascular: Negative.   Gastrointestinal: Negative.   Musculoskeletal: Positive for gait problem.  Neurological: Negative.   Hematological: Negative.   Psychiatric/Behavioral: Negative.   All other systems reviewed and are negative.   BP 170/64 mmHg  Pulse 94  Ht  (1.651 m)  Wt 186 lb 8 oz (84.596 kg)  BMI 31.04 kg/m2 Repeat blood pressures with  systolic 160 Physical Exam  Constitutional: She is oriented to person, place, and time. She appears well-developed and well-nourished.  HENT:  Head: Normocephalic.  Nose: Nose normal.  Mouth/Throat: Oropharynx is clear and moist.  Eyes: Conjunctivae are normal. Pupils are equal, round, and reactive to light.  Neck: Normal range of motion. Neck supple. No JVD present.  Cardiovascular: Intact distal pulses.  An irregularly irregular rhythm present. Tachycardia present.  Exam reveals no gallop and no friction rub.   No murmur heard. Pulmonary/Chest: Effort normal and breath sounds normal. No respiratory distress. She has no wheezes. She has no rales. She exhibits no tenderness.  Abdominal: Soft. Bowel sounds are normal. She exhibits no distension. There is no tenderness.  Musculoskeletal: Normal  range of motion. She exhibits no edema or tenderness.  Lymphadenopathy:    She has no cervical adenopathy.  Neurological: She is alert and oriented to person, place, and time. Coordination normal.  Skin: Skin is warm and dry. No rash noted. No erythema.  Psychiatric: She has a normal mood and affect. Her behavior is normal. Judgment and thought content normal.

## 2015-11-23 NOTE — Assessment & Plan Note (Signed)
Blood pressure is elevated on today's visit as on previous visit. Recommended she start Cardura 4 mg in the evening. This can be titrated upwards if needed for blood pressure control

## 2015-11-23 NOTE — Assessment & Plan Note (Signed)
We have encouraged continued exercise, careful diet management in an effort to lose weight.   Total encounter time more than 25 minutes  Greater than 50% was spent in counseling and coordination of care with the patient 

## 2015-11-23 NOTE — Assessment & Plan Note (Addendum)
Long discussion concerning her atrial fibrillation. Currently with persistent atrial fibrillation She is not interested in cardioversion. We will stop the amiodarone. We have discussed this medication change with her. We spent some time discussing the cardioversion procedure.  She reports that she does not have any symptoms, prefers not to have any procedures performed She knows that she will likely stay in atrial fibrillation permanently and will need to stay on anticoagulation We have recommended that if she changes her mind, that she call our office. "I will not change my mind" It is clearly an acceptable decision to stay in atrial fibrillation as she is asymptomatic

## 2015-11-23 NOTE — Assessment & Plan Note (Signed)
INR is 4 Likely elevated secondary to amiodarone Amiodarone will be stopped today permanently Recommended she hold warfarin for 2 days, follow-up with primary care for warfarin check in one week

## 2015-11-23 NOTE — Patient Instructions (Addendum)
You are doing well.  You are still in the atrial fibrillation Your INR today is 4.0 Skip your dose of warfarin on Saturday & Sunday Resume warfarin on Monday  Stop the amiodarone  Start cardura /doxazosin once a day in the evening for blood pressure  Please call us if you have new issues that need to be addressed before your next appt.  Your physician wants you to follow-up in: 3 months.   Date & time: _________________________  Atrial Fibrillation Atrial fibrillation is a type of irregular or rapid heartbeat (arrhythmia). In atrial fibrillation, the heart quivers continuously in a chaotic pattern. This occurs when parts of the heart receive disorganized signals that make the heart unable to pump blood normally. This can increase the risk for stroke, heart failure, and other heart-related conditions. There are different types of atrial fibrillation, including:  Paroxysmal atrial fibrillation. This type starts suddenly, and it usually stops on its own shortly after it starts.  Persistent atrial fibrillation. This type often lasts longer than a week. It may stop on its own or with treatment.  Long-lasting persistent atrial fibrillation. This type lasts longer than 12 months.  Permanent atrial fibrillation. This type does not go away. Talk with your health care provider to learn about the type of atrial fibrillation that you have. CAUSES This condition is caused by some heart-related conditions or procedures, including:  A heart attack.  Coronary artery disease.  Heart failure.  Heart valve conditions.  High blood pressure.  Inflammation of the sac that surrounds the heart (pericarditis).  Heart surgery.  Certain heart rhythm disorders, such as Wolf-Parkinson-White syndrome. Other causes include:  Pneumonia.  Obstructive sleep apnea.  Blockage of an artery in the lungs (pulmonary embolism, or PE).  Lung cancer.  Chronic lung disease.  Thyroid problems, especially  if the thyroid is overactive (hyperthyroidism).  Caffeine.  Excessive alcohol use or illegal drug use.  Use of some medicines, including certain decongestants and diet pills. Sometimes, the cause cannot be found. RISK FACTORS This condition is more likely to develop in:  People who are older in age.  People who smoke.  People who have diabetes mellitus.  People who are overweight (obese).  Athletes who exercise vigorously. SYMPTOMS Symptoms of this condition include:  A feeling that your heart is beating rapidly or irregularly.  A feeling of discomfort or pain in your chest.  Shortness of breath.  Sudden light-headedness or weakness.  Getting tired easily during exercise. In some cases, there are no symptoms. DIAGNOSIS Your health care provider may be able to detect atrial fibrillation when taking your pulse. If detected, this condition may be diagnosed with:  An electrocardiogram (ECG).  A Holter monitor test that records your heartbeat patterns over a 24-hour period.  Transthoracic echocardiogram (TTE) to evaluate how blood flows through your heart.  Transesophageal echocardiogram (TEE) to view more detailed images of your heart.  A stress test.  Imaging tests, such as a CT scan or chest X-ray.  Blood tests. TREATMENT The main goals of treatment are to prevent blood clots from forming and to keep your heart beating at a normal rate and rhythm. The type of treatment that you receive depends on many factors, such as your underlying medical conditions and how you feel when you are experiencing atrial fibrillation. This condition may be treated with:  Medicine to slow down the heart rate, bring the heart's rhythm back to normal, or prevent clots from forming.  Electrical cardioversion. This is a procedure  that resets your heart's rhythm by delivering a controlled, low-energy shock to the heart through your skin.  Different types of ablation, such as catheter  ablation, catheter ablation with pacemaker, or surgical ablation. These procedures destroy the heart tissues that send abnormal signals. When the pacemaker is used, it is placed under your skin to help your heart beat in a regular rhythm. HOME CARE INSTRUCTIONS  Take over-the counter and prescription medicines only as told by your health care provider.  If your health care provider prescribed a blood-thinning medicine (anticoagulant), take it exactly as told. Taking too much blood-thinning medicine can cause bleeding. If you do not take enough blood-thinning medicine, you will not have the protection that you need against stroke and other problems.  Do not use tobacco products, including cigarettes, chewing tobacco, and e-cigarettes. If you need help quitting, ask your health care provider.  If you have obstructive sleep apnea, manage your condition as told by your health care provider.  Do not drink alcohol.  Do not drink beverages that contain caffeine, such as coffee, soda, and tea.  Maintain a healthy weight. Do not use diet pills unless your health care provider approves. Diet pills may make heart problems worse.  Follow diet instructions as told by your health care provider.  Exercise regularly as told by your health care provider.  Keep all follow-up visits as told by your health care provider. This is important. PREVENTION  Avoid drinking beverages that contain caffeine or alcohol.  Avoid certain medicines, especially medicines that are used for breathing problems.  Avoid certain herbs and herbal medicines, such as those that contain ephedra or ginseng.  Do not use illegal drugs, such as cocaine and amphetamines.  Do not smoke.  Manage your high blood pressure. SEEK MEDICAL CARE IF:  You notice a change in the rate, rhythm, or strength of your heartbeat.  You are taking an anticoagulant and you notice increased bruising.  You tire more easily when you exercise or  exert yourself. SEEK IMMEDIATE MEDICAL CARE IF:  You have chest pain, abdominal pain, sweating, or weakness.  You feel nauseous.  You notice blood in your vomit, bowel movement, or urine.  You have shortness of breath.  You suddenly have swollen feet and ankles.  You feel dizzy.  You have sudden weakness or numbness of the face, arm, or leg, especially on one side of the body.  You have trouble speaking, trouble understanding, or both (aphasia).  Your face or your eyelid droops on one side. These symptoms may represent a serious problem that is an emergency. Do not wait to see if the symptoms will go away. Get medical help right away. Call your local emergency services (911 in the [place] Do not drive yourself to the hospital.   This information is not intended to replace advice given to you by your health care provider. Make sure you discuss any questions you have with your health care provider.   Document Released: 06/16/2005 Document Revised: 03/07/2015 Document Reviewed: 10/11/2014 Elsevier Interactive Patient Education 2016 [employer] Writerlectrical Cardioversion Electrical cardioversion is the delivery of a jolt of electricity to change the rhythm of the heart. Sticky patches or metal paddles are placed on the chest to deliver the electricity from a device. This is done to restore a normal rhythm. A rhythm that is too fast or not regular keeps the heart from pumping well. Electrical cardioversion is done in an emergency if:   There is low or no blood pressure  as a result of the heart rhythm.   Normal rhythm must be restored as fast as possible to protect the brain and heart from further damage.   It may save a life. Cardioversion may be done for heart rhythms that are not immediately life threatening, such as atrial fibrillation or flutter, in which:   The heart is beating too fast or is not regular.   Medicine to change the rhythm has not worked.   It is safe to  wait in order to allow time for preparation.  Symptoms of the abnormal rhythm are bothersome.  The risk of stroke and other serious problems can be reduced. LET [facility] CARE PROVIDER KNOW ABOUT:   Any allergies you have.  All medicines you are taking, including vitamins, herbs, eye drops, creams, and over-the-counter medicines.  Previous problems you or members of your family have had with the use of anesthetics.   Any blood disorders you have.   Previous surgeries you have had.   Medical conditions you have. RISKS AND COMPLICATIONS  Generally, this is a safe procedure. However, problems can occur and include:   Breathing problems related to the anesthetic used.  A blood clot that breaks free and travels to other parts of your body. This could cause a stroke or other problems. The risk of this is lowered by use of blood-thinning medicine (anticoagulant) prior to the procedure.  Cardiac arrest (rare). BEFORE THE PROCEDURE   You may have tests to detect blood clots in your heart and to evaluate heart function.  You may start taking anticoagulants so your blood does not clot as easily.   Medicines may be given to help stabilize your heart rate and rhythm. PROCEDURE  You will be given medicine through an IV tube to reduce discomfort and make you sleepy (sedative).   An electrical shock will be delivered. AFTER THE PROCEDURE Your heart rhythm will be watched to make sure it does not change.    This information is not intended to replace advice given to you by your health care provider. Make sure you discuss any questions you have with your health care provider.   Document Released: 06/06/2002 Document Revised: 07/07/2014 Document Reviewed: 12/29/2012 Elsevier Interactive Patient Education [employer]

## 2015-11-29 ENCOUNTER — Ambulatory Visit (INDEPENDENT_AMBULATORY_CARE_PROVIDER_SITE_OTHER): Payer: Medicare Other | Admitting: *Deleted

## 2015-11-29 DIAGNOSIS — Z5181 Encounter for therapeutic drug level monitoring: Secondary | ICD-10-CM | POA: Diagnosis not present

## 2015-11-29 DIAGNOSIS — I4819 Other persistent atrial fibrillation: Secondary | ICD-10-CM

## 2015-11-29 DIAGNOSIS — I481 Persistent atrial fibrillation: Secondary | ICD-10-CM | POA: Diagnosis not present

## 2015-11-29 LAB — POCT INR: INR: 2.6

## 2015-11-29 MED ORDER — WARFARIN SODIUM 2.5 MG PO TABS: ORAL_TABLET | ORAL | Status: DC

## 2015-11-29 NOTE — Progress Notes (Signed)
Pre visit review using our clinic review tool, if applicable. No additional management support is needed unless otherwise documented below in the visit note. 

## 2015-12-06 ENCOUNTER — Ambulatory Visit (INDEPENDENT_AMBULATORY_CARE_PROVIDER_SITE_OTHER): Payer: Medicare Other | Admitting: *Deleted

## 2015-12-06 DIAGNOSIS — Z5181 Encounter for therapeutic drug level monitoring: Secondary | ICD-10-CM | POA: Diagnosis not present

## 2015-12-06 DIAGNOSIS — I4819 Other persistent atrial fibrillation: Secondary | ICD-10-CM

## 2015-12-06 DIAGNOSIS — I481 Persistent atrial fibrillation: Secondary | ICD-10-CM

## 2015-12-06 LAB — POCT INR: INR: 2.4

## 2015-12-06 NOTE — Progress Notes (Signed)
Pre visit review using our clinic review tool, if applicable. No additional management support is needed unless otherwise documented below in the visit note. 

## 2015-12-27 ENCOUNTER — Other Ambulatory Visit (INDEPENDENT_AMBULATORY_CARE_PROVIDER_SITE_OTHER): Payer: Medicare Other

## 2015-12-27 DIAGNOSIS — Z5181 Encounter for therapeutic drug level monitoring: Secondary | ICD-10-CM | POA: Diagnosis not present

## 2015-12-27 DIAGNOSIS — I481 Persistent atrial fibrillation: Secondary | ICD-10-CM | POA: Diagnosis not present

## 2015-12-27 DIAGNOSIS — I4819 Other persistent atrial fibrillation: Secondary | ICD-10-CM

## 2015-12-27 LAB — POCT INR: INR: 2.1

## 2016-01-17 ENCOUNTER — Other Ambulatory Visit (INDEPENDENT_AMBULATORY_CARE_PROVIDER_SITE_OTHER): Payer: Medicare Other

## 2016-01-17 DIAGNOSIS — I481 Persistent atrial fibrillation: Secondary | ICD-10-CM | POA: Diagnosis not present

## 2016-01-17 DIAGNOSIS — Z5181 Encounter for therapeutic drug level monitoring: Secondary | ICD-10-CM | POA: Diagnosis not present

## 2016-01-17 DIAGNOSIS — I4819 Other persistent atrial fibrillation: Secondary | ICD-10-CM

## 2016-01-17 LAB — POCT INR: INR: 2.1

## 2016-01-24 ENCOUNTER — Other Ambulatory Visit: Payer: Medicare Other

## 2016-01-31 ENCOUNTER — Other Ambulatory Visit (INDEPENDENT_AMBULATORY_CARE_PROVIDER_SITE_OTHER): Payer: Medicare Other

## 2016-01-31 DIAGNOSIS — Z5181 Encounter for therapeutic drug level monitoring: Secondary | ICD-10-CM

## 2016-01-31 DIAGNOSIS — I481 Persistent atrial fibrillation: Secondary | ICD-10-CM

## 2016-01-31 DIAGNOSIS — I4819 Other persistent atrial fibrillation: Secondary | ICD-10-CM

## 2016-01-31 LAB — POCT INR: INR: 1.5

## 2016-02-07 ENCOUNTER — Other Ambulatory Visit (INDEPENDENT_AMBULATORY_CARE_PROVIDER_SITE_OTHER): Payer: Medicare Other

## 2016-02-07 DIAGNOSIS — I4819 Other persistent atrial fibrillation: Secondary | ICD-10-CM

## 2016-02-07 DIAGNOSIS — I481 Persistent atrial fibrillation: Secondary | ICD-10-CM | POA: Diagnosis not present

## 2016-02-07 DIAGNOSIS — Z5181 Encounter for therapeutic drug level monitoring: Secondary | ICD-10-CM | POA: Diagnosis not present

## 2016-02-07 LAB — POCT INR: INR: 2

## 2016-02-11 ENCOUNTER — Other Ambulatory Visit: Payer: Medicare Other

## 2016-02-24 ENCOUNTER — Other Ambulatory Visit: Payer: Self-pay | Admitting: Family Medicine

## 2016-02-25 NOTE — Telephone Encounter (Signed)
Please refill times 3 

## 2016-02-25 NOTE — Telephone Encounter (Signed)
Pt has INR appt scheduled for 03/06/16, please advise

## 2016-02-26 ENCOUNTER — Telehealth: Payer: Self-pay | Admitting: Cardiovascular Disease

## 2016-02-26 ENCOUNTER — Encounter: Payer: Self-pay | Admitting: Cardiovascular Disease

## 2016-02-26 ENCOUNTER — Ambulatory Visit (INDEPENDENT_AMBULATORY_CARE_PROVIDER_SITE_OTHER): Payer: Medicare Other | Admitting: Cardiovascular Disease

## 2016-02-26 VITALS — BP 158/72 | HR 82 | Ht 65.0 in | Wt 181.8 lb

## 2016-02-26 DIAGNOSIS — I4819 Other persistent atrial fibrillation: Secondary | ICD-10-CM

## 2016-02-26 DIAGNOSIS — I481 Persistent atrial fibrillation: Secondary | ICD-10-CM | POA: Diagnosis not present

## 2016-02-26 DIAGNOSIS — I10 Essential (primary) hypertension: Secondary | ICD-10-CM

## 2016-02-26 DIAGNOSIS — E669 Obesity, unspecified: Secondary | ICD-10-CM

## 2016-02-26 DIAGNOSIS — R5382 Chronic fatigue, unspecified: Secondary | ICD-10-CM

## 2016-02-26 DIAGNOSIS — E785 Hyperlipidemia, unspecified: Secondary | ICD-10-CM

## 2016-02-26 DIAGNOSIS — R0602 Shortness of breath: Secondary | ICD-10-CM

## 2016-02-26 MED ORDER — DOXAZOSIN MESYLATE 8 MG PO TABS: 8.0000 mg | ORAL_TABLET | Freq: Two times a day (BID) | ORAL | 6 refills | Status: DC

## 2016-02-26 NOTE — Telephone Encounter (Signed)
Pt calling having some questions on Digoxin  Has a question on the refill and how to take it.  She got it refilled it for 4 mg 2 x a day or 8 mg once a day  She was told that we are going to send in refill that is 60 pills  When did we plan on sending this to her pharmacy Please send it to [facility] in [place] when we decide to.  Also needs to know when she is to take 8 mg

## 2016-02-26 NOTE — Patient Instructions (Addendum)
Medication Instructions:   Please increase the cardura/doxasozin up to 8 mg daily (or 4 mg twice a day)   Labwork:  No new labs needed  Testing/Procedures:  No further testing at this time   Follow-Up: It was a pleasure seeing you in the office today. Please call us if you have new issues that need to be addressed before your next appt.  [phone]  Your physician wants you to follow-up in: 3 months.  You will receive a reminder letter in the mail two months in advance. If you don't receive a letter, please call our office to schedule the follow-up appointment.  If you need a refill on your cardiac medications before your next appointment, please call your pharmacy.

## 2016-02-26 NOTE — Telephone Encounter (Signed)
Spoke w/ pt's daughter, Misty StanleyLisa. She reports that pt is concerned about the cost of DOXAZOSIN, as 8 mg BID was sent in today and the cost increased from $22/month to $39/month. She states that this should not be a problem for pt and asks that I call her to explain that the rx should last her 2 months if she follows the instructions given to her at today's ov: "Please increase the cardura/doxasozin up to 8 mg daily (or 4 mg twice a day)" Spoke w/ pt.  She was very confused and asked that I repeat the instructions back to her several times. As we were about to hang up, she asked for "one more question". She then stated that the instructions on her AVS were different than on her med list. Repeated again to follow the instructions on her AVS. She thanked me an abruptly hung up.

## 2016-02-26 NOTE — Progress Notes (Signed)
Cardiology Office Note  Date:  02/26/2016   ID:  Linda Johnson, DOB April 11, 1936, MRN [id number]  PCP:  [name], MD   Chief Complaint  Patient presents with  . Other    3 month follow up. Meds reviewed by the patient verbally. "doing well."     HPI:  Linda Johnson is a 80 year old woman who  Is a patient of Dr. Milinda Antis who presents for routine follow-up of her atrial fibrillation  On a previous clinic visit, she was started on amiodarone in an attempt to cardiovert In follow-up she Declined cardioversion Denies any significant shortness of breath, no fatigue, no leg edema, orthopnea or PND  In follow-up today, she reports that she talk to her daughter and again this thinking about a cardioversion. We spent some time discussing her symptoms Apart from some mild fatigue and shortness of breath, she feels fine  She has not been checking her blood pressure at home Blood pressure continues to run high, 150s systolic on my check today even on repeat  no edema, chest pain No regular exercise program  Previously called with symptoms of nausea, feels it was from the amiodarone.  This was decreased down to 200 mg daily  EKG on today's visit shows atrial fibrillation with ventricular rate 82 bpm, incomplete right bundle branch block, nonspecific ST abnormality  Other past medical history reviewed  developed shortness of breath approximately one year ago. Family, including her daughters reported that she was more short of breath on exertion and talking on the phone. She does have some leg swelling but this has not been significant  Atrial fibrillation picked up by primary care on routine EKG early 2017   PMH:   has a past medical history of Hypertension.  PSH:    Past Surgical History:  Procedure Laterality Date  . BREAST BIOPSY Left 2011   2 areas, cysts per pt    Current Outpatient Prescriptions  Medication Sig Dispense Refill  . calcium-vitamin D (OSCAL WITH D) 500-200  MG-UNIT per tablet Take 1 tablet by mouth 2 (two) times daily.    . Cholecalciferol (VITAMIN D3) 2000 UNITS TABS Take 1 tablet by mouth 2 (two) times daily.     Marland Kitchen diltiazem (CARDIZEM CD) 120 MG 24 hr capsule Take 1 capsule (120 mg total) by mouth daily. 30 capsule 6  . diltiazem (CARDIZEM CD) 240 MG 24 hr capsule Take 1 capsule (240 mg total) by mouth daily. 30 capsule 11  . doxazosin (CARDURA) 8 MG tablet Take 1 tablet (8 mg total) by mouth 2 (two) times daily. 60 tablet 6  . fish oil-omega-3 fatty acids 1000 MG capsule Take 1 g by mouth.    . losartan (COZAAR) 100 MG tablet Take 1 tablet (100 mg total) by mouth daily. 90 tablet 3  . warfarin (COUMADIN) 2.5 MG tablet TAKE AS DIRECTED BY ANTI-COAGULATION CLINIC. 90 tablet 3   No current facility-administered medications for this visit.      Allergies:   Ace inhibitors and Tramadol hcl   Social History:  The patient  reports that she has never smoked. She has never used smokeless tobacco. She reports that she does not drink alcohol or use drugs.   Family History:   family history includes Breast cancer (age of onset: 4) in her maternal aunt.    Review of Systems: Review of Systems  Constitutional: Positive for malaise/fatigue.  Respiratory: Positive for shortness of breath.   Cardiovascular: Negative.   Gastrointestinal: Negative.  Musculoskeletal: Negative.   Neurological: Negative.   Psychiatric/Behavioral: Negative.   All other systems reviewed and are negative.    PHYSICAL EXAM: VS:  BP (!) 158/72 (BP Location: Left Arm, Patient Position: Sitting, Cuff Size: Normal)   Pulse 82   Ht 5\' 5"  (1.651 m)   Wt 181 lb 12 oz (82.4 kg)   BMI 30.24 kg/m  , BMI Body mass index is 30.24 kg/m. GEN: Well nourished, well developed, in no acute distress  HEENT: normal  Neck: no JVD, carotid bruits, or masses Cardiac: Irregularly irregular, no murmurs, rubs, or gallops,no edema  Respiratory:  clear to auscultation bilaterally, normal  work of breathing GI: soft, nontender, nondistended, + BS MS: no deformity or atrophy  Skin: warm and dry, no rash Neuro:  Strength and sensation are intact Psych: euthymic mood, full affect    Recent Labs: 09/13/2015: ALT 15; BUN 16; Creatinine, Ser 1.07; Hemoglobin 16.0; Platelets 290.0; Potassium 4.5; Sodium 138; TSH 6.52    Lipid Panel Lab Results  Component Value Date   CHOL 175 09/13/2015   HDL 45.00 09/13/2015   LDLCALC 104 (H) 09/13/2015   TRIG 132.0 09/13/2015      Wt Readings from Last 3 Encounters:  02/26/16 181 lb 12 oz (82.4 kg)  11/23/15 186 lb 8 oz (84.6 kg)  11/07/15 187 lb (84.8 kg)       ASSESSMENT AND PLAN:  Persistent atrial fibrillation (HCC) - Plan: EKG 12-Lead Long discussion concerning various treatment options for her atrial fibrillation She is relatively asymptomatic Previously declined cardioversion, now reports that she might be interested but she does not know what it entails. Previously did not tolerate amiodarone After further discussion, she is not interested in cardioversion. She would likely need to stay on anticoagulation even after cardioversion  Essential hypertension - Plan: EKG 12-Lead Blood pressure continues to run high. Recommended she increase Cardura to 8 mg daily Stay on her other medications Recommended she monitor blood pressure at home Call our office if numbers continue to run high  Hyperlipidemia Turney on fish oils, not on a statin  Obesity We have encouraged continued exercise, careful diet management in an effort to lose weight.  Shortness of breath Shortness of breath likely multifactorial. Appears to be very deconditioned Recommended weight loss, regular walking program for conditioning  Chronic fatigue As above, fatigue likely secondary to poor conditioning I do not think restoring normal sinus rhythm with dramatically impact her symptoms   Total encounter time more than 25 minutes  Greater than 50%  was spent in counseling and coordination of care with the patient   Disposition:   F/U  3 months   Orders Placed This Encounter  Procedures  . EKG 12-Lead     Signed, Dossie Arbourim Shalva Rozycki, M.D., Ph.D. 02/26/2016  [facility], [place] [phone]

## 2016-02-26 NOTE — Telephone Encounter (Signed)
Pt calling back again stating she was called by [facility]  They told her it would be $39 a month  She can't afford this  Wanted some advise on this as well.  Please call back   (see below)

## 2016-03-06 ENCOUNTER — Other Ambulatory Visit (INDEPENDENT_AMBULATORY_CARE_PROVIDER_SITE_OTHER): Payer: Medicare Other

## 2016-03-06 DIAGNOSIS — R739 Hyperglycemia, unspecified: Secondary | ICD-10-CM | POA: Diagnosis not present

## 2016-03-06 DIAGNOSIS — I481 Persistent atrial fibrillation: Secondary | ICD-10-CM

## 2016-03-06 DIAGNOSIS — I10 Essential (primary) hypertension: Secondary | ICD-10-CM

## 2016-03-06 DIAGNOSIS — Z5181 Encounter for therapeutic drug level monitoring: Secondary | ICD-10-CM | POA: Diagnosis not present

## 2016-03-06 DIAGNOSIS — E785 Hyperlipidemia, unspecified: Secondary | ICD-10-CM | POA: Diagnosis not present

## 2016-03-06 DIAGNOSIS — I4819 Other persistent atrial fibrillation: Secondary | ICD-10-CM

## 2016-03-06 DIAGNOSIS — E039 Hypothyroidism, unspecified: Secondary | ICD-10-CM

## 2016-03-06 LAB — COMPREHENSIVE METABOLIC PANEL
ALBUMIN: 4.3 g/dL (ref 3.5–5.2)
ALT: 15 U/L (ref 0–35)
AST: 15 U/L (ref 0–37)
Alkaline Phosphatase: 54 U/L (ref 39–117)
BUN: 13 mg/dL (ref 6–23)
CALCIUM: 9.5 mg/dL (ref 8.4–10.5)
CHLORIDE: 101 meq/L (ref 96–112)
CO2: 30 mEq/L (ref 19–32)
CREATININE: 1.07 mg/dL (ref 0.40–1.20)
GFR: 52.72 mL/min — ABNORMAL LOW (ref >60.00)
Glucose, Bld: 107 mg/dL — ABNORMAL HIGH (ref 70–99)
POTASSIUM: 4.4 meq/L (ref 3.5–5.1)
Sodium: 137 mEq/L (ref 135–145)
Total Bilirubin: 0.6 mg/dL (ref 0.2–1.2)
Total Protein: 7.8 g/dL (ref 6.0–8.3)

## 2016-03-06 LAB — POCT INR: INR: 2.4

## 2016-03-06 LAB — LIPID PANEL
Cholesterol: 172 mg/dL (ref 0–200)
HDL: 42.8 mg/dL (ref >39.00)
LDL Cholesterol: 95 mg/dL (ref 0–99)
NONHDL: 128.99
TRIGLYCERIDES: 170 mg/dL — AB (ref 0.0–149.0)
Total CHOL/HDL Ratio: 4
VLDL: 34 mg/dL (ref 0.0–40.0)

## 2016-03-06 LAB — TSH: TSH: 4.42 u[IU]/mL (ref 0.35–4.50)

## 2016-03-06 LAB — HEMOGLOBIN A1C: HEMOGLOBIN A1C: 5.8 % (ref 4.6–6.5)

## 2016-03-07 ENCOUNTER — Other Ambulatory Visit: Payer: Medicare Other

## 2016-03-07 ENCOUNTER — Telehealth: Payer: Self-pay | Admitting: Family Medicine

## 2016-03-07 NOTE — Telephone Encounter (Signed)
Patient called Linda Johnson back.  I let her know to continue the same dose of Warfarin.  Patient said she has an appointment with Dr.Tower on 03/19/16 and patient said she'll schedule her next lab visit when she comes in for the appointment.

## 2016-03-11 ENCOUNTER — Other Ambulatory Visit: Payer: Self-pay | Admitting: Family Medicine

## 2016-03-11 DIAGNOSIS — Z1231 Encounter for screening mammogram for malignant neoplasm of breast: Secondary | ICD-10-CM

## 2016-03-13 ENCOUNTER — Other Ambulatory Visit: Payer: Medicare Other

## 2016-03-19 ENCOUNTER — Encounter: Payer: Self-pay | Admitting: Family Medicine

## 2016-03-19 ENCOUNTER — Ambulatory Visit (INDEPENDENT_AMBULATORY_CARE_PROVIDER_SITE_OTHER): Payer: Medicare Other | Admitting: Family Medicine

## 2016-03-19 VITALS — BP 122/68 | HR 79 | Temp 97.5°F | Ht 65.0 in | Wt 178.2 lb

## 2016-03-19 DIAGNOSIS — R739 Hyperglycemia, unspecified: Secondary | ICD-10-CM

## 2016-03-19 DIAGNOSIS — I481 Persistent atrial fibrillation: Secondary | ICD-10-CM

## 2016-03-19 DIAGNOSIS — Z23 Encounter for immunization: Secondary | ICD-10-CM | POA: Diagnosis not present

## 2016-03-19 DIAGNOSIS — E785 Hyperlipidemia, unspecified: Secondary | ICD-10-CM

## 2016-03-19 DIAGNOSIS — E669 Obesity, unspecified: Secondary | ICD-10-CM | POA: Diagnosis not present

## 2016-03-19 DIAGNOSIS — I10 Essential (primary) hypertension: Secondary | ICD-10-CM

## 2016-03-19 DIAGNOSIS — I4819 Other persistent atrial fibrillation: Secondary | ICD-10-CM

## 2016-03-19 NOTE — Progress Notes (Signed)
Subjective:    Patient ID: Linda Johnson, female    DOB: 21-Oct-1935, 80 y.o.   MRN: [id number]  HPI Here for f/u of chronic health problems   Wt Readings from Last 3 Encounters:  03/19/16 178 lb 4 oz (80.9 kg)  02/26/16 181 lb 12 oz (82.4 kg)  11/23/15 186 lb 8 oz (84.6 kg)   bmi is 29.6 Wt is down 3 lb (8 lb since May)  She had dental surgery/got dentures/not eating as fast and eating slower   Some walking for exercise   Is in persistent a fib- warfarin and diltiazem Pulse rate is 79   bp is stable today  No cp or palpitations or headaches or edema  No side effects to medicines  BP Readings from Last 3 Encounters:  03/19/16 122/68  02/26/16 (!) 158/72  11/23/15 (!) 170/64     Hx of hyperglycemia Lab Results  Component Value Date   HGBA1C 5.8 03/06/2016    Hx of hyperlipidemia Lab Results  Component Value Date   CHOL 172 03/06/2016   CHOL 175 09/13/2015   CHOL 150 09/07/2014   Lab Results  Component Value Date   HDL 42.80 03/06/2016   HDL 45.00 09/13/2015   HDL 44.90 09/07/2014   Lab Results  Component Value Date   LDLCALC 95 03/06/2016   LDLCALC 104 (H) 09/13/2015   LDLCALC 85 09/07/2014   Lab Results  Component Value Date   TRIG 170.0 (H) 03/06/2016   TRIG 132.0 09/13/2015   TRIG 101.0 09/07/2014   Lab Results  Component Value Date   CHOLHDL 4 03/06/2016   CHOLHDL 4 09/13/2015   CHOLHDL 3 09/07/2014   Lab Results  Component Value Date   LDLDIRECT 132.4 05/06/2010   LDLDIRECT 107.9 04/02/2007  cannot eat a lot of vegetables or crunchy things  Can eat some meat   Flu shot today  Patient Active Problem List   Diagnosis Date Noted  . Encounter for therapeutic drug monitoring 09/24/2015  . Encounter for anticoagulation discussion and counseling 09/20/2015  . Irregular heart rate 09/17/2015  . Atrial fibrillation (HCC) 09/17/2015  . Hearing loss 09/17/2015  . Hypothyroidism 12/12/2014  . Hyperglycemia 09/11/2014  . Encounter for  Medicare annual wellness exam 09/11/2014  . Estrogen deficiency 09/11/2014  . Abnormal TSH 09/11/2014  . Pyogenic granuloma 06/06/2014  . Routine general medical examination at a health care facility 09/09/2013  . Low back pain 03/21/2013  . Other screening mammogram 04/19/2012  . Post-menopausal 04/19/2012  . Obesity 08/18/2011  . Hyperlipidemia 06/12/2010  . Osteoporosis 06/12/2010  . ALLERGIC RHINITIS CAUSE UNSPECIFIED 04/12/2008  . HX, PERSONAL, COLONIC POLYPS 04/02/2007  . STRABISMUS 03/02/2007  . Essential hypertension 03/02/2007   Past Medical History:  Diagnosis Date  . Hypertension    Past Surgical History:  Procedure Laterality Date  . BREAST BIOPSY Left 2011   2 areas, cysts per pt   Social History  Substance Use Topics  . Smoking status: Never Smoker  . Smokeless tobacco: Never Used  . Alcohol use No   Family History  Problem Relation Age of Onset  . Breast cancer Maternal Aunt 50   Allergies  Allergen Reactions  . Ace Inhibitors     REACTION: cough  . Tramadol Hcl     nausea   Current Outpatient Prescriptions on File Prior to Visit  Medication Sig Dispense Refill  . calcium-vitamin D (OSCAL WITH D) 500-200 MG-UNIT per tablet Take 1 tablet by mouth 2 (two) times  daily.    . Cholecalciferol (VITAMIN D3) 2000 UNITS TABS Take 1 tablet by mouth 2 (two) times daily.     Marland Kitchen. diltiazem (CARDIZEM CD) 120 MG 24 hr capsule Take 1 capsule (120 mg total) by mouth daily. 30 capsule 6  . diltiazem (CARDIZEM CD) 240 MG 24 hr capsule Take 1 capsule (240 mg total) by mouth daily. 30 capsule 11  . doxazosin (CARDURA) 8 MG tablet Take 1 tablet (8 mg total) by mouth 2 (two) times daily. 60 tablet 6  . fish oil-omega-3 fatty acids 1000 MG capsule Take 1 g by mouth.    . losartan (COZAAR) 100 MG tablet Take 1 tablet (100 mg total) by mouth daily. 90 tablet 3  . warfarin (COUMADIN) 2.5 MG tablet TAKE AS DIRECTED BY ANTI-COAGULATION CLINIC. 90 tablet 3   No current  facility-administered medications on file prior to visit.      Review of Systems Review of Systems  Constitutional: Negative for fever, appetite change, fatigue and unexpected weight change.  Eyes: Negative for pain and visual disturbance.  Respiratory: Negative for cough and shortness of breath.   Cardiovascular: Negative for cp or palpitations    Gastrointestinal: Negative for nausea, diarrhea and constipation.  Genitourinary: Negative for urgency and frequency.  Skin: Negative for pallor or rash   Neurological: Negative for weakness, light-headedness, numbness and headaches.  Hematological: Negative for adenopathy. Does not bruise/bleed easily.  Psychiatric/Behavioral: Negative for dysphoric mood. The patient is not nervous/anxious.         Objective:   Physical Exam  Constitutional: She appears well-developed and well-nourished. No distress.  HENT:  Head: Normocephalic and atraumatic.  Mouth/Throat: Oropharynx is clear and moist.  New upper dental plate  Eyes: Conjunctivae and EOM are normal. Pupils are equal, round, and reactive to light.  Baseline strabismus  Neck: Normal range of motion. Neck supple. No JVD present. Carotid bruit is not present. No thyromegaly present.  Cardiovascular: Normal rate, normal heart sounds and intact distal pulses.  Exam reveals no gallop.   Irregularly irregular rhythm   Pulmonary/Chest: Effort normal and breath sounds normal. No respiratory distress. She has no wheezes. She has no rales.  No crackles  Abdominal: Soft. Bowel sounds are normal. She exhibits no distension, no abdominal bruit and no mass. There is no tenderness.  Musculoskeletal: She exhibits no edema.  Lymphadenopathy:    She has no cervical adenopathy.  Neurological: She is alert. She has normal reflexes. No cranial nerve deficit. She exhibits normal muscle tone. Coordination normal.  Skin: Skin is warm and dry. No rash noted.  Psychiatric: She has a normal mood and affect.           Assessment & Plan:   Problem List Items Addressed This Visit      Cardiovascular and Mediastinum   Essential hypertension - Primary    bp in fair control at this time  BP Readings from Last 1 Encounters:  03/19/16 122/68   No changes needed Disc lifstyle change with low sodium diet and exercise  Labs reviewed Enc more wt loss       Atrial fibrillation (HCC)    Rate controlled and on warfarin -no c/o  For cardiology f/u in Nov        Other   Obesity    Discussed how this problem influences overall health and the risks it imposes  Reviewed plan for weight loss with lower calorie diet (via better food choices and also portion control or program like  weight watchers) and exercise building up to or more than 30 minutes 5 days per week including some aerobic activity   She is eating less and slower due to dental issues-commended on wt loss so far      Hyperlipidemia    Disc goals for lipids and reasons to control them Rev labs with pt Rev low sat fat diet in detail Trig up -poss due to change in diet with dental surg (more carbs)  willl continue to follow      Hyperglycemia    Lab Results  Component Value Date   HGBA1C 5.8 03/06/2016   Pt is eating less and loosing wt Disc low glycemic diet and wt control to prevent DM F/u 6 mo       Other Visit Diagnoses    Need for influenza vaccination       Relevant Orders   Flu Vaccine QUAD 36+ mos IM (Completed)

## 2016-03-19 NOTE — Assessment & Plan Note (Signed)
Discussed how this problem influences overall health and the risks it imposes  Reviewed plan for weight loss with lower calorie diet (via better food choices and also portion control or program like weight watchers) and exercise building up to or more than 30 minutes 5 days per week including some aerobic activity   She is eating less and slower due to dental issues-commended on wt loss so far

## 2016-03-19 NOTE — Assessment & Plan Note (Signed)
bp in fair control at this time  BP Readings from Last 1 Encounters:  03/19/16 122/68   No changes needed Disc lifstyle change with low sodium diet and exercise  Labs reviewed Enc more wt loss

## 2016-03-19 NOTE — Assessment & Plan Note (Signed)
Rate controlled and on warfarin -no c/o  For cardiology f/u in Nov

## 2016-03-19 NOTE — Patient Instructions (Signed)
Good job so far with weight loss - keep it up  Try to eat healthy and keep moving  Numbers are good  Flu shot today  Follow up in March as planned

## 2016-03-19 NOTE — Assessment & Plan Note (Signed)
Lab Results  Component Value Date   HGBA1C 5.8 03/06/2016   Pt is eating less and loosing wt Disc low glycemic diet and wt control to prevent DM F/u 6 mo

## 2016-03-19 NOTE — Progress Notes (Signed)
Pre visit review using our clinic review tool, if applicable. No additional management support is needed unless otherwise documented below in the visit note. 

## 2016-03-19 NOTE — Assessment & Plan Note (Signed)
Disc goals for lipids and reasons to control them Rev labs with pt Rev low sat fat diet in detail Trig up -poss due to change in diet with dental surg (more carbs)  willl continue to follow

## 2016-04-17 ENCOUNTER — Other Ambulatory Visit (INDEPENDENT_AMBULATORY_CARE_PROVIDER_SITE_OTHER): Payer: Medicare Other

## 2016-04-17 DIAGNOSIS — I481 Persistent atrial fibrillation: Secondary | ICD-10-CM | POA: Diagnosis not present

## 2016-04-17 DIAGNOSIS — I4819 Other persistent atrial fibrillation: Secondary | ICD-10-CM

## 2016-04-17 DIAGNOSIS — Z5181 Encounter for therapeutic drug level monitoring: Secondary | ICD-10-CM | POA: Diagnosis not present

## 2016-04-17 LAB — POCT INR: INR: 2.8

## 2016-05-15 ENCOUNTER — Other Ambulatory Visit: Payer: Self-pay | Admitting: Cardiovascular Disease

## 2016-05-15 ENCOUNTER — Ambulatory Visit: Payer: Medicare Other | Admitting: Cardiovascular Disease

## 2016-05-29 ENCOUNTER — Other Ambulatory Visit (INDEPENDENT_AMBULATORY_CARE_PROVIDER_SITE_OTHER): Payer: Medicare Other

## 2016-05-29 DIAGNOSIS — Z5181 Encounter for therapeutic drug level monitoring: Secondary | ICD-10-CM | POA: Diagnosis not present

## 2016-05-29 DIAGNOSIS — I481 Persistent atrial fibrillation: Secondary | ICD-10-CM | POA: Diagnosis not present

## 2016-05-29 DIAGNOSIS — I4819 Other persistent atrial fibrillation: Secondary | ICD-10-CM

## 2016-05-29 LAB — POCT INR: INR: 2.3

## 2016-06-05 ENCOUNTER — Ambulatory Visit (INDEPENDENT_AMBULATORY_CARE_PROVIDER_SITE_OTHER): Payer: Medicare Other | Admitting: Cardiovascular Disease

## 2016-06-05 ENCOUNTER — Encounter: Payer: Self-pay | Admitting: Cardiovascular Disease

## 2016-06-05 VITALS — BP 144/62 | HR 99 | Ht 65.0 in | Wt 180.5 lb

## 2016-06-05 DIAGNOSIS — I4819 Other persistent atrial fibrillation: Secondary | ICD-10-CM

## 2016-06-05 DIAGNOSIS — I481 Persistent atrial fibrillation: Secondary | ICD-10-CM | POA: Diagnosis not present

## 2016-06-05 DIAGNOSIS — Z7189 Other specified counseling: Secondary | ICD-10-CM

## 2016-06-05 DIAGNOSIS — I10 Essential (primary) hypertension: Secondary | ICD-10-CM

## 2016-06-05 DIAGNOSIS — E782 Mixed hyperlipidemia: Secondary | ICD-10-CM

## 2016-06-05 NOTE — Progress Notes (Signed)
Cardiology Office Note  Date:  06/05/2016   ID:  Linda CousinsLinda R Cervi, DOB 11/10/1935, MRN [id number]  PCP:  Roxy MannsMarne Tower, MD   Chief Complaint  Patient presents with  . OTHER    3 month f/u no complaints. Meds reviewed verbally with pt.    HPI:  [name] is a 80 year old woman who Is a patient of Dr. Milinda Antisower who presents for routine follow-up of her atrial fibrillation And hypertension  Today she presents by herself, daughter is not present Reports of blood pressure measurements at home are around 150 up to 160 systolic She feels that this is pretty good  On cardura 4 mg daily with diltiazem Otherwise feels well with no complaints of shortness of breath or chest discomfort Does not appreciate her atrial fibrillation No regular exercise program  Total chol 172, LDL 95  EKG on today's visit shows atrial fibrillation with ventricular rate 99 bpm, PVCs, nonspecific ST abnormality  Other past medical history reviewed On a previous clinic visit, she was started on amiodarone in an attempt to cardiovert In follow-up she Declined cardioversion  Previously felt she had nausea from the low-dose amiodarone  Atrial fibrillation picked up by primary care on routine EKG early 2017   PMH:   has a past medical history of Hypertension.  PSH:    Past Surgical History:  Procedure Laterality Date  . BREAST BIOPSY Left 2011   2 areas, cysts per pt    Current Outpatient Prescriptions  Medication Sig Dispense Refill  . calcium-vitamin D (OSCAL WITH D) 500-200 MG-UNIT per tablet Take 1 tablet by mouth 2 (two) times daily.    . Cholecalciferol (VITAMIN D3) 2000 UNITS TABS Take 1 tablet by mouth 2 (two) times daily.     Marland Kitchen. diltiazem (CARDIZEM CD) 120 MG 24 hr capsule TAKE 1 CAPSULE (120 MG TOTAL) BY MOUTH DAILY. 30 capsule 3  . diltiazem (CARDIZEM CD) 240 MG 24 hr capsule Take 1 capsule (240 mg total) by mouth daily. 30 capsule 11  . doxazosin (CARDURA) 8 MG tablet Take 1 tablet (8 mg total) by  mouth 2 (two) times daily. 60 tablet 6  . fish oil-omega-3 fatty acids 1000 MG capsule Take 1 g by mouth.    . losartan (COZAAR) 100 MG tablet Take 1 tablet (100 mg total) by mouth daily. 90 tablet 3  . warfarin (COUMADIN) 2.5 MG tablet TAKE AS DIRECTED BY ANTI-COAGULATION CLINIC. 90 tablet 3   No current facility-administered medications for this visit.      Allergies:   Ace inhibitors and Tramadol hcl   Social History:  The patient  reports that she has never smoked. She has never used smokeless tobacco. She reports that she does not drink alcohol or use drugs.   Family History:   family history includes Breast cancer (age of onset: 1250) in her maternal aunt.    Review of Systems: Review of Systems  Constitutional: Negative.   Respiratory: Negative.   Cardiovascular: Negative.   Gastrointestinal: Negative.   Musculoskeletal: Negative.   Neurological: Negative.   Psychiatric/Behavioral: Negative.   All other systems reviewed and are negative.    PHYSICAL EXAM: VS:  BP (!) 144/62 (BP Location: Left Arm, Patient Position: Sitting, Cuff Size: Large)   Pulse 99   Ht 5\' 5"  (1.651 m)   Wt 180 lb 8 oz (81.9 kg)   BMI 30.04 kg/m  , BMI Body mass index is 30.04 kg/m. GEN: Well nourished, well developed, in no acute distress, obese  HEENT: normal  Neck: no JVD, carotid bruits, or masses Cardiac: Irregularly irregular, no murmurs, rubs, or gallops,no edema  Respiratory:  clear to auscultation bilaterally, normal work of breathing GI: soft, nontender, nondistended, + BS MS: no deformity or atrophy  Skin: warm and dry, no rash Neuro:  Strength and sensation are intact Psych: euthymic mood, full affect    Recent Labs: 09/13/2015: Hemoglobin 16.0; Platelets 290.0 03/06/2016: ALT 15; BUN 13; Creatinine, Ser 1.07; Potassium 4.4; Sodium 137; TSH 4.42    Lipid Panel Lab Results  Component Value Date   CHOL 172 03/06/2016   HDL 42.80 03/06/2016   LDLCALC 95 03/06/2016   TRIG  170.0 (H) 03/06/2016      Wt Readings from Last 3 Encounters:  06/05/16 180 lb 8 oz (81.9 kg)  03/19/16 178 lb 4 oz (80.9 kg)  02/26/16 181 lb 12 oz (82.4 kg)       ASSESSMENT AND PLAN:  Persistent atrial fibrillation (HCC) - Plan: EKG 12-Lead Rate mildly elevated but she is asymptomatic. Will monitor for now Tolerating warfarin, INR well controlled  Essential hypertension Blood pressure running high, recommended she increase Cardura to 4 mg twice a day Initially reluctant to make any medication changes Long discussion with her concerning the need to obtain better blood pressure She will continue to monitor numbers at home  Mixed hyperlipidemia Recommended she hold the fish oil. This can act as a blood thinner and she is already on warfarin  Encounter for anticoagulation discussion and counseling She is happy to stay on warfarin Overall doing well with no complications No recent falls   Total encounter time more than 25 minutes  Greater than 50% was spent in counseling and coordination of care with the patient   Disposition:   F/U  6 months   Orders Placed This Encounter  Procedures  . EKG 12-Lead     Signed, Dossie Arbourim Arber Wiemers, M.D., Ph.D. 06/05/2016  [facility] [place] [phone]

## 2016-06-05 NOTE — Patient Instructions (Addendum)
Medication Instructions:   No medication changes made Please continue to monitor and write down your blood pressure numbers  If they run >150 consistently on the top, We would increase the cardura up to 1/2 pill twice a day  Hold the fish oil, This can act as a blood thinner  Labwork:  No new labs needed  Testing/Procedures:  No further testing at this time   I recommend watching educational videos on topics of interest to you at:       [email or web address].goemmi.com  Enter code: [email or web address]    Follow-Up: It was a pleasure seeing you in the office today. Please call us if you have new issues that need to be addressed before your next appt.  [phone]  Your physician wants you to follow-up in: 6 months.  You will receive a reminder letter in the mail two months in advance. If you don't receive a letter, please call our office to schedule the follow-up appointment.  If you need a refill on your cardiac medications before your next appointment, please call your pharmacy.

## 2016-07-11 ENCOUNTER — Ambulatory Visit: Payer: Medicare Other

## 2016-07-15 ENCOUNTER — Ambulatory Visit (INDEPENDENT_AMBULATORY_CARE_PROVIDER_SITE_OTHER): Payer: Medicare Other | Admitting: Internal Medicine

## 2016-07-15 ENCOUNTER — Encounter: Payer: Self-pay | Admitting: Internal Medicine

## 2016-07-15 ENCOUNTER — Ambulatory Visit: Payer: Medicare Other

## 2016-07-15 VITALS — BP 130/66 | HR 84 | Temp 97.8°F | Wt 172.0 lb

## 2016-07-15 DIAGNOSIS — I4819 Other persistent atrial fibrillation: Secondary | ICD-10-CM

## 2016-07-15 DIAGNOSIS — Z5181 Encounter for therapeutic drug level monitoring: Secondary | ICD-10-CM

## 2016-07-15 DIAGNOSIS — J069 Acute upper respiratory infection, unspecified: Secondary | ICD-10-CM

## 2016-07-15 LAB — POCT INR: INR: 4.3

## 2016-07-15 MED ORDER — AMOXICILLIN 875 MG PO TABS: 875.0000 mg | ORAL_TABLET | Freq: Two times a day (BID) | ORAL | 0 refills | Status: DC

## 2016-07-15 MED ORDER — HYDROCODONE-HOMATROPINE 5-1.5 MG/5ML PO SYRP: 5.0000 mL | ORAL_SOLUTION | Freq: Three times a day (TID) | ORAL | 0 refills | Status: DC | PRN

## 2016-07-15 NOTE — Patient Instructions (Signed)
Pre visit review using our clinic review tool, if applicable. No additional management support is needed unless otherwise documented below in the visit note.  INR today 4.3  Patient's diet, general health and medications reviewed and concerns identified.  Patient was seen today by a provider and dx with acute respiratory illness and started on Amoxicillin abx.  In addition, patient reports taking daily cinnamon extract concentrations contained in "aspen spice".  Instructed patient to stop cinnamon/aspen spice cider and hold coumadin X 2 days then take 1 pill daily EXCEPT for 1/2 pill on Mondays, Wednesdays, and fridays.  Recheck in 1-2 weeks due to supratherpeutic level and current abx use.  Recheck on 07/25/16.  Patient educated on risks associated with supratherapeutic levels and verbalizes understanding of all instructions and to go to the ER if any uncontrollable or unusual bruising or bleeding develops.

## 2016-07-15 NOTE — Patient Instructions (Signed)
Upper Respiratory Infection, Adult Most upper respiratory infections (URIs) are caused by a virus. A URI affects the nose, throat, and upper air passages. The most common type of URI is often called "the common cold." Follow these instructions at home:  Take medicines only as told by your doctor.  Gargle warm saltwater or take cough drops to comfort your throat as told by your doctor.  Use a warm mist humidifier or inhale steam from a shower to increase air moisture. This may make it easier to breathe.  Drink enough fluid to keep your pee (urine) clear or pale yellow.  Eat soups and other clear broths.  Have a healthy diet.  Rest as needed.  Go back to work when your fever is gone or your doctor says it is okay.  You may need to stay home longer to avoid giving your URI to others.  You can also wear a face mask and wash your hands often to prevent spread of the virus.  Use your inhaler more if you have asthma.  Do not use any tobacco products, including cigarettes, chewing tobacco, or electronic cigarettes. If you need help quitting, ask your doctor. Contact a doctor if:  You are getting worse, not better.  Your symptoms are not helped by medicine.  You have chills.  You are getting more short of breath.  You have brown or red mucus.  You have yellow or brown discharge from your nose.  You have pain in your face, especially when you bend forward.  You have a fever.  You have puffy (swollen) neck glands.  You have pain while swallowing.  You have white areas in the back of your throat. Get help right away if:  You have very bad or constant:  Headache.  Ear pain.  Pain in your forehead, behind your eyes, and over your cheekbones (sinus pain).  Chest pain.  You have long-lasting (chronic) lung disease and any of the following:  Wheezing.  Long-lasting cough.  Coughing up blood.  A change in your usual mucus.  You have a stiff neck.  You have  changes in your:  Vision.  Hearing.  Thinking.  Mood. This information is not intended to replace advice given to you by your health care provider. Make sure you discuss any questions you have with your health care provider. Document Released: 12/03/2007 Document Revised: 02/17/2016 Document Reviewed: 09/21/2013 Elsevier Interactive Patient Education  2017 Elsevier Inc.  

## 2016-07-15 NOTE — Progress Notes (Signed)
HPI  Pt presents to the clinic today with c/o nasal congestion, cough and chest congestion. This started 2 weeks ago. She is blowing clear mucous out of her nose. The cough is productive of green mucous. Her symptoms seem worse at night. She denies fever or body aches, but has had some chills. She has tried Coricidin, Robitussin and Afrin with minimal relief. She has no history of allergies or breathing problems. She has had sick contacts. She did get her flu shot this year.  Review of Systems        Past Medical History:  Diagnosis Date  . Hypertension     Family History  Problem Relation Age of Onset  . Breast cancer Maternal Aunt [id number]    Social History   Social History  . Marital status: Divorced    Spouse name: N/A  . Number of children: N/A  . Years of education: N/A   Occupational History  . Not on file.   Social History Main Topics  . Smoking status: Never Smoker  . Smokeless tobacco: Never Used  . Alcohol use No  . Drug use: No  . Sexual activity: No   Other Topics Concern  . Not on file   Social History Narrative  . No narrative on file    Allergies  Allergen Reactions  . Ace Inhibitors     REACTION: cough  . Tramadol Hcl     nausea     Constitutional: Denies headache, fatigue, fever or abrupt weight changes.  HEENT:  Positive nasal congestion. Denies eye redness, eye pain, pressure behind the eyes, facial pain, ear pain, ringing in the ears, wax buildup, runny nose or sore throat. Respiratory: Positive cough. Denies difficulty breathing or shortness of breath.  Cardiovascular: Denies chest pain, chest tightness, palpitations or swelling in the hands or feet.   No other specific complaints in a complete review of systems (except as listed in HPI above).  Objective:   BP 130/66   Pulse 84   Temp 97.8 F (36.6 C) (Oral)   Wt 172 lb (78 kg)   SpO2 97%   BMI 28.62 kg/m   Wt Readings from Last 3 Encounters:  07/15/16 172 lb (78 kg)  06/05/16  180 lb 8 oz (81.9 kg)  03/19/16 178 lb 4 oz (80.9 kg)     General: Appears her stated age, in NAD. HEENT: Head: normal shape and size, no sinus tenderness noted; Eyes: sclera white, no icterus, conjunctiva pink; Ears: bilateral cerumen impaction; Nose: mucosa boggy and moist, septum midline; Throat/Mouth: + PND. Teeth present, mucosa pink and moist, no exudate noted, no lesions or ulcerations noted.  Neck: No cervical lymphadenopathy.  Cardiovascular: Normal rate with irregular rhythm. Pulmonary/Chest: Normal effort and positive vesicular breath sounds. No respiratory distress. No wheezes, rales or ronchi noted.       Assessment & Plan:   Upper Respiratory Infection:  Given duration of symptoms, will treat with abx Get some rest and drink plenty of water eRx for Amoxil BID x 7 days Rx for Hycodan cough syrup  RTC as needed or if symptoms persist.   Nicki ReaperBAITY, REGINA, NP

## 2016-07-21 ENCOUNTER — Telehealth: Payer: Self-pay

## 2016-07-21 MED ORDER — PROMETHAZINE-DM 6.25-15 MG/5ML PO SYRP: 5.0000 mL | ORAL_SOLUTION | Freq: Two times a day (BID) | ORAL | 0 refills | Status: DC | PRN

## 2016-07-21 NOTE — Telephone Encounter (Signed)
Left detailed msg on VM per HIPAA  

## 2016-07-21 NOTE — Telephone Encounter (Signed)
Pt left v/m; pt seen 07/15/16; cough med(Hycodan) makes pt nauseated and pt request different med for cough to [facility]. Pt request cb.

## 2016-07-21 NOTE — Telephone Encounter (Signed)
RX sent to pharmacy  

## 2016-07-21 NOTE — Addendum Note (Signed)
Addended by: Lorre MunroeBAITY, REGINA W on: 07/21/2016 12:30 PM   Modules accepted: Orders

## 2016-07-25 ENCOUNTER — Ambulatory Visit (INDEPENDENT_AMBULATORY_CARE_PROVIDER_SITE_OTHER): Payer: Medicare Other

## 2016-07-25 ENCOUNTER — Other Ambulatory Visit: Payer: Self-pay | Admitting: Family Medicine

## 2016-07-25 ENCOUNTER — Ambulatory Visit
Admission: RE | Admit: 2016-07-25 | Discharge: 2016-07-25 | Disposition: A | Discharge status: Home / Self Care | Payer: Medicare Other | Source: Ambulatory Visit | Attending: Family Medicine | Admitting: Family Medicine

## 2016-07-25 DIAGNOSIS — Z1231 Encounter for screening mammogram for malignant neoplasm of breast: Secondary | ICD-10-CM | POA: Insufficient documentation

## 2016-07-25 DIAGNOSIS — Z5181 Encounter for therapeutic drug level monitoring: Secondary | ICD-10-CM

## 2016-07-25 DIAGNOSIS — I4819 Other persistent atrial fibrillation: Secondary | ICD-10-CM

## 2016-07-25 DIAGNOSIS — I481 Persistent atrial fibrillation: Secondary | ICD-10-CM

## 2016-07-25 LAB — HM MAMMOGRAPHY

## 2016-07-25 LAB — POCT INR: INR: 2.6

## 2016-07-25 NOTE — Patient Instructions (Signed)
Pre visit review using our clinic review tool, if applicable. No additional management support is needed unless otherwise documented below in the visit note. 

## 2016-08-22 ENCOUNTER — Ambulatory Visit (INDEPENDENT_AMBULATORY_CARE_PROVIDER_SITE_OTHER): Payer: Medicare Other

## 2016-08-22 DIAGNOSIS — Z5181 Encounter for therapeutic drug level monitoring: Secondary | ICD-10-CM

## 2016-08-22 DIAGNOSIS — I481 Persistent atrial fibrillation: Secondary | ICD-10-CM | POA: Diagnosis not present

## 2016-08-22 DIAGNOSIS — I4819 Other persistent atrial fibrillation: Secondary | ICD-10-CM

## 2016-08-22 LAB — POCT INR: INR: 1.8

## 2016-08-22 NOTE — Patient Instructions (Signed)
Pre visit review using our clinic review tool, if applicable. No additional management support is needed unless otherwise documented below in the visit note. 

## 2016-08-27 ENCOUNTER — Other Ambulatory Visit: Payer: Self-pay | Admitting: Cardiovascular Disease

## 2016-09-11 ENCOUNTER — Telehealth: Payer: Self-pay | Admitting: Family Medicine

## 2016-09-11 DIAGNOSIS — E782 Mixed hyperlipidemia: Secondary | ICD-10-CM

## 2016-09-11 DIAGNOSIS — R739 Hyperglycemia, unspecified: Secondary | ICD-10-CM

## 2016-09-11 DIAGNOSIS — I10 Essential (primary) hypertension: Secondary | ICD-10-CM

## 2016-09-11 DIAGNOSIS — E039 Hypothyroidism, unspecified: Secondary | ICD-10-CM

## 2016-09-11 NOTE — Telephone Encounter (Signed)
-----   Message from Robert Bellowarlesia R Pinson, LPN sent at 1/61/09603/13/2018  4:41 PM EDT ----- Regarding: Labs 09/15/16 Please place lab orders, if any. If none, please reply "NO LABS".   Thank you. :)

## 2016-09-15 ENCOUNTER — Other Ambulatory Visit: Payer: Medicare Other

## 2016-09-15 ENCOUNTER — Ambulatory Visit: Payer: Medicare Other

## 2016-09-15 ENCOUNTER — Ambulatory Visit (INDEPENDENT_AMBULATORY_CARE_PROVIDER_SITE_OTHER): Payer: Medicare Other

## 2016-09-15 ENCOUNTER — Other Ambulatory Visit (INDEPENDENT_AMBULATORY_CARE_PROVIDER_SITE_OTHER): Payer: Medicare Other

## 2016-09-15 DIAGNOSIS — E782 Mixed hyperlipidemia: Secondary | ICD-10-CM

## 2016-09-15 DIAGNOSIS — I481 Persistent atrial fibrillation: Secondary | ICD-10-CM

## 2016-09-15 DIAGNOSIS — Z5181 Encounter for therapeutic drug level monitoring: Secondary | ICD-10-CM | POA: Diagnosis not present

## 2016-09-15 DIAGNOSIS — I4819 Other persistent atrial fibrillation: Secondary | ICD-10-CM

## 2016-09-15 DIAGNOSIS — I10 Essential (primary) hypertension: Secondary | ICD-10-CM | POA: Diagnosis not present

## 2016-09-15 DIAGNOSIS — R739 Hyperglycemia, unspecified: Secondary | ICD-10-CM

## 2016-09-15 DIAGNOSIS — E039 Hypothyroidism, unspecified: Secondary | ICD-10-CM | POA: Diagnosis not present

## 2016-09-15 LAB — LIPID PANEL
CHOL/HDL RATIO: 3
Cholesterol: 152 mg/dL (ref 0–200)
HDL: 50 mg/dL (ref >39.00)
LDL CALC: 87 mg/dL (ref 0–99)
NonHDL: 102.01
Triglycerides: 76 mg/dL (ref 0.0–149.0)
VLDL: 15.2 mg/dL (ref 0.0–40.0)

## 2016-09-15 LAB — CBC WITH DIFFERENTIAL/PLATELET
BASOS ABS: 0 10*3/uL (ref 0.0–0.1)
BASOS PCT: 0.5 % (ref 0.0–3.0)
EOS ABS: 0.1 10*3/uL (ref 0.0–0.7)
Eosinophils Relative: 1.1 % (ref 0.0–5.0)
HEMATOCRIT: 41.4 % (ref 36.0–46.0)
HEMOGLOBIN: 14 g/dL (ref 12.0–15.0)
LYMPHS PCT: 18 % (ref 12.0–46.0)
Lymphs Abs: 1.6 10*3/uL (ref 0.7–4.0)
MCHC: 33.7 g/dL (ref 30.0–36.0)
MCV: 91.6 fl (ref 78.0–100.0)
Monocytes Absolute: 0.5 10*3/uL (ref 0.1–1.0)
Monocytes Relative: 5.4 % (ref 3.0–12.0)
NEUTROS ABS: 6.7 10*3/uL (ref 1.4–7.7)
Neutrophils Relative %: 75 % (ref 43.0–77.0)
PLATELETS: 222 10*3/uL (ref 150.0–400.0)
RBC: 4.53 Mil/uL (ref 3.87–5.11)
RDW: 14.8 % (ref 11.5–15.5)
WBC: 8.9 10*3/uL (ref 4.0–10.5)

## 2016-09-15 LAB — COMPREHENSIVE METABOLIC PANEL
ALT: 10 U/L (ref 0–35)
AST: 14 U/L (ref 0–37)
Albumin: 4.2 g/dL (ref 3.5–5.2)
Alkaline Phosphatase: 49 U/L (ref 39–117)
BILIRUBIN TOTAL: 0.7 mg/dL (ref 0.2–1.2)
BUN: 20 mg/dL (ref 6–23)
CALCIUM: 9.8 mg/dL (ref 8.4–10.5)
CHLORIDE: 105 meq/L (ref 96–112)
CO2: 29 meq/L (ref 19–32)
Creatinine, Ser: 1.04 mg/dL (ref 0.40–1.20)
GFR: 54.41 mL/min — AB (ref >60.00)
Glucose, Bld: 98 mg/dL (ref 70–99)
Potassium: 4.8 mEq/L (ref 3.5–5.1)
Sodium: 137 mEq/L (ref 135–145)
Total Protein: 7.4 g/dL (ref 6.0–8.3)

## 2016-09-15 LAB — TSH: TSH: 4.36 u[IU]/mL (ref 0.35–4.50)

## 2016-09-15 LAB — POCT INR: INR: 1.4

## 2016-09-15 LAB — HEMOGLOBIN A1C: Hgb A1c MFr Bld: 5.7 % (ref 4.6–6.5)

## 2016-09-15 NOTE — Patient Instructions (Signed)
Pre visit review using our clinic review tool, if applicable. No additional management support is needed unless otherwise documented below in the visit note.  INR 1.4  Patient denies any missed doses.  After extensive review of meds, health and diet, patient acknowledges eating increased amounts of avocado and has recently stopped her fish oil supplement per cardiologist orders.  Will have patient take an extra 1/2 pill today and tomorrow and then increase weekly dose to taking 1 (2.5mg ) pill daily EXCEPT 1/2 pill (1.25mg ) on Mondays and Fridays recheck in 1 week.  Patient educated on risks associated with subtherapeutic level and will go to ER if any concerns develop.  Patient also verbalizes understanding of all instruction given today.  A written copy of instructions was also provided for safety.

## 2016-09-19 ENCOUNTER — Ambulatory Visit (INDEPENDENT_AMBULATORY_CARE_PROVIDER_SITE_OTHER): Payer: Medicare Other | Admitting: Family Medicine

## 2016-09-19 ENCOUNTER — Encounter: Payer: Self-pay | Admitting: Family Medicine

## 2016-09-19 VITALS — BP 130/70 | HR 91 | Temp 97.7°F | Ht 64.0 in | Wt 174.2 lb

## 2016-09-19 DIAGNOSIS — I481 Persistent atrial fibrillation: Secondary | ICD-10-CM | POA: Diagnosis not present

## 2016-09-19 DIAGNOSIS — E782 Mixed hyperlipidemia: Secondary | ICD-10-CM

## 2016-09-19 DIAGNOSIS — E2839 Other primary ovarian failure: Secondary | ICD-10-CM | POA: Diagnosis not present

## 2016-09-19 DIAGNOSIS — M810 Age-related osteoporosis without current pathological fracture: Secondary | ICD-10-CM | POA: Diagnosis not present

## 2016-09-19 DIAGNOSIS — H6120 Impacted cerumen, unspecified ear: Secondary | ICD-10-CM | POA: Insufficient documentation

## 2016-09-19 DIAGNOSIS — R739 Hyperglycemia, unspecified: Secondary | ICD-10-CM

## 2016-09-19 DIAGNOSIS — H6123 Impacted cerumen, bilateral: Secondary | ICD-10-CM

## 2016-09-19 DIAGNOSIS — E039 Hypothyroidism, unspecified: Secondary | ICD-10-CM

## 2016-09-19 DIAGNOSIS — I4819 Other persistent atrial fibrillation: Secondary | ICD-10-CM

## 2016-09-19 DIAGNOSIS — I10 Essential (primary) hypertension: Secondary | ICD-10-CM

## 2016-09-19 MED ORDER — LOSARTAN POTASSIUM 100 MG PO TABS: 100.0000 mg | ORAL_TABLET | Freq: Every day | ORAL | 3 refills | Status: DC

## 2016-09-19 NOTE — Progress Notes (Signed)
Pre visit review using our clinic review tool, if applicable. No additional management support is needed unless otherwise documented below in the visit note. 

## 2016-09-19 NOTE — Progress Notes (Signed)
Subjective:    Patient ID: Linda Johnson, female    DOB: October 08, 1935, 81 y.o.   MRN: [id number]  HPI  Here for annual f/u of chronic health problems   Feeling good doing well in general  Wt is down from December   AMW is upcoming on tues   Sister with leukemia died in 2022-08-29 at 56   Wt Readings from Last 3 Encounters:  09/19/16 174 lb 4 oz (79 kg)  07/15/16 172 lb (78 kg)  06/05/16 180 lb 8 oz (81.9 kg)  eating a healthy diet  More fruits and veg (with stable amt of greens every day for coumadin)  bmi 29.9  utd imms except Td was 07  Mammogram 1/18 nl  Self breast exam-no lumps or changes   Gyn health-no probems or pain or vaginitis at all   dexa 4/16 OP  Has had full course of fosamax Is interested in bone density at 2 y - due in April  No falls or fractures (walks with a cane and is very careful) She takes her ca and D3    Colonoscopy 5/14 nl with 5 y recall - but given age will likely not do it   bp is stable today (2nd check) No cp or palpitations or headaches or edema  No side effects to medicines  BP Readings from Last 3 Encounters:  09/19/16 (!) 146/88  07/15/16 130/66  06/05/16 (!) 144/62    BP: 130/70  Better on 2nd check    Hx of a fib  INR has been out of whack lately  Lab Results  Component Value Date   INR 1.4 09/15/2016   INR 1.8 08/22/2016   INR 2.6 07/25/2016     Hypothyroidism  Pt has no clinical changes No change in energy level/ hair or skin/ edema and no tremor Lab Results  Component Value Date   TSH 4.36 09/15/2016      Hx of hyperlipidemia Lab Results  Component Value Date   CHOL 152 09/15/2016   CHOL 172 03/06/2016   CHOL 175 09/13/2015   Lab Results  Component Value Date   HDL 50.00 09/15/2016   HDL 42.80 03/06/2016   HDL 45.00 09/13/2015   Lab Results  Component Value Date   LDLCALC 87 09/15/2016   LDLCALC 95 03/06/2016   LDLCALC 104 (H) 09/13/2015   Lab Results  Component Value Date   TRIG 76.0 09/15/2016    TRIG 170.0 (H) 03/06/2016   TRIG 132.0 09/13/2015   Lab Results  Component Value Date   CHOLHDL 3 09/15/2016   CHOLHDL 4 03/06/2016   CHOLHDL 4 09/13/2015   Lab Results  Component Value Date   LDLDIRECT 132.4 05/06/2010   LDLDIRECT 107.9 04/02/2007  Dr Mariah Milling took her off fish oil    Hx of hyperglycemia Lab Results  Component Value Date   HGBA1C 5.7 09/15/2016    Patient Active Problem List   Diagnosis Date Noted  . Cerumen impaction 09/19/2016  . Encounter for therapeutic drug monitoring 09/24/2015  . Encounter for anticoagulation discussion and counseling 09/20/2015  . Irregular heart rate 09/17/2015  . Atrial fibrillation (HCC) 09/17/2015  . Hearing loss 09/17/2015  . Hypothyroidism 12/12/2014  . Hyperglycemia 09/11/2014  . Encounter for Medicare annual wellness exam 09/11/2014  . Estrogen deficiency 09/11/2014  . Pyogenic granuloma 06/06/2014  . Routine general medical examination at a health care facility 09/09/2013  . Low back pain 03/21/2013  . Other screening mammogram 04/19/2012  . Post-menopausal  04/19/2012  . Obesity 08/18/2011  . Hyperlipidemia 06/12/2010  . Osteoporosis 06/12/2010  . ALLERGIC RHINITIS CAUSE UNSPECIFIED 04/12/2008  . HX, PERSONAL, COLONIC POLYPS 04/02/2007  . STRABISMUS 03/02/2007  . Essential hypertension 03/02/2007   Past Medical History:  Diagnosis Date  . Hypertension    Past Surgical History:  Procedure Laterality Date  . BREAST BIOPSY Left 2011   2 areas, cysts per pt   Social History  Substance Use Topics  . Smoking status: Never Smoker  . Smokeless tobacco: Never Used  . Alcohol use No   Family History  Problem Relation Age of Onset  . Leukemia Sister   . Breast cancer Maternal Aunt 50   Allergies  Allergen Reactions  . Ace Inhibitors     REACTION: cough  . Hydrocodone-Homatropine Nausea Only  . Tramadol Hcl     nausea   Current Outpatient Prescriptions on File Prior to Visit  Medication Sig Dispense  Refill  . calcium-vitamin D (OSCAL WITH D) 500-200 MG-UNIT per tablet Take 1 tablet by mouth 2 (two) times daily.    . Cholecalciferol (VITAMIN D3) 2000 UNITS TABS Take 1 tablet by mouth 2 (two) times daily.     Marland Kitchen. diltiazem (CARDIZEM CD) 120 MG 24 hr capsule TAKE 1 CAPSULE (120 MG TOTAL) BY MOUTH DAILY. 30 capsule 3  . diltiazem (CARDIZEM CD) 240 MG 24 hr capsule TAKE 1 CAPSULE (240 MG TOTAL) BY MOUTH DAILY. 30 capsule 3  . doxazosin (CARDURA) 8 MG tablet Take 1 tablet (8 mg total) by mouth 2 (two) times daily. 60 tablet 6  . warfarin (COUMADIN) 2.5 MG tablet TAKE AS DIRECTED BY ANTI-COAGULATION CLINIC. 90 tablet 3   No current facility-administered medications on file prior to visit.     Review of Systems Review of Systems  Constitutional: Negative for fever, appetite change, fatigue and unexpected weight change.  Eyes: Negative for pain and visual disturbance.  Respiratory: Negative for cough and shortness of breath.   Cardiovascular: Negative for cp or palpitations    Gastrointestinal: Negative for nausea, diarrhea and constipation.  Genitourinary: Negative for urgency and frequency.  Skin: Negative for pallor or rash   Neurological: Negative for weakness, light-headedness, numbness and headaches.  Hematological: Negative for adenopathy. Does not bruise/bleed easily.  Psychiatric/Behavioral: Negative for dysphoric mood. The patient is not nervous/anxious.         Objective:   Physical Exam  Constitutional: She appears well-developed and well-nourished. No distress.  obese and well appearing   HENT:  Head: Normocephalic and atraumatic.  Right Ear: External ear normal.  Left Ear: External ear normal.  Mouth/Throat: Oropharynx is clear and moist.  Eyes: Conjunctivae and EOM are normal. Pupils are equal, round, and reactive to light. No scleral icterus.  Baseline strabismus   Neck: Normal range of motion. Neck supple. No JVD present. Carotid bruit is not present. No thyromegaly  present.  Cardiovascular: Normal rate, normal heart sounds and intact distal pulses.  Exam reveals no gallop.   irreg rhythm   Pulmonary/Chest: Effort normal and breath sounds normal. No respiratory distress. She has no wheezes. She exhibits no tenderness.  Abdominal: Soft. Bowel sounds are normal. She exhibits no distension, no abdominal bruit and no mass. There is no tenderness.  Genitourinary: No breast swelling, tenderness, discharge or bleeding.  Genitourinary Comments: Breast exam: No mass, nodules, thickening, tenderness, bulging, retraction, inflamation, nipple discharge or skin changes noted.  No axillary or clavicular LA.      Musculoskeletal: Normal range of motion.  She exhibits no edema or tenderness.  Mild kyphosis   Lymphadenopathy:    She has no cervical adenopathy.  Neurological: She is alert. She has normal reflexes. No cranial nerve deficit. She exhibits normal muscle tone. Coordination normal.  Skin: Skin is warm and dry. No rash noted. No erythema. No pallor.  Some lentigines and sks   Psychiatric: She has a normal mood and affect.          Assessment & Plan:   Problem List Items Addressed This Visit      Cardiovascular and Mediastinum   Atrial fibrillation (HCC)    Warfarin and rate control  Has INR scheduled tuesday      Relevant Medications   losartan (COZAAR) 100 MG tablet   Essential hypertension - Primary    bp in fair control at this time  BP Readings from Last 1 Encounters:  09/19/16 130/70   No changes needed Disc lifstyle change with low sodium diet and exercise  Better on 2nd check  Losartan refilled  Diltiazem managed by cardiology      Relevant Medications   losartan (COZAAR) 100 MG tablet     Endocrine   Hypothyroidism    Hypothyroidism  Pt has no clinical changes No change in energy level/ hair or skin/ edema and no tremor Lab Results  Component Value Date   TSH 4.36 09/15/2016     She is not on any medication at this time          Nervous and Auditory   Cerumen impaction    Likely affecting hearing  Will use debrox 2 times weekly at home  f/u 1-2 mo for irrigation if no improvement         Musculoskeletal and Integument   Osteoporosis    2 y f/u dexa planned in April  Disc need for calcium/ vitamin D/ wt bearing exercise and bone density test every 2 y to monitor Disc safety/ fracture risk in detail   She has had a full course of fosamax        Other   Estrogen deficiency   Relevant Orders   DG Bone Density   Hyperglycemia    Lab Results  Component Value Date   HGBA1C 5.7 09/15/2016   This is well controlled  disc imp of low glycemic diet and wt loss to prevent DM2       Hyperlipidemia    Now off fish oil Disc goals for lipids and reasons to control them Rev labs with pt Rev low sat fat diet in detail  Good control with diet       Relevant Medications   losartan (COZAAR) 100 MG tablet

## 2016-09-19 NOTE — Assessment & Plan Note (Signed)
Warfarin and rate control  Has INR scheduled tuesday

## 2016-09-19 NOTE — Assessment & Plan Note (Signed)
Hypothyroidism  Pt has no clinical changes No change in energy level/ hair or skin/ edema and no tremor Lab Results  Component Value Date   TSH 4.36 09/15/2016     She is not on any medication at this time

## 2016-09-19 NOTE — Assessment & Plan Note (Signed)
2 y f/u dexa planned in April  Disc need for calcium/ vitamin D/ wt bearing exercise and bone density test every 2 y to monitor Disc safety/ fracture risk in detail   She has had a full course of fosamax

## 2016-09-19 NOTE — Assessment & Plan Note (Signed)
Likely affecting hearing  Will use debrox 2 times weekly at home  f/u 1-2 mo for irrigation if no improvement

## 2016-09-19 NOTE — Patient Instructions (Addendum)
See the handout regarding getting a tetanus shot   Get debrox or other brand of ear wax loosening drops at the drugstore and use it as directed twice a week  If no improvement in 1-2 months - make an appt for us to flush your ears   Stay active   Labs are stable   See Virl AxeLesia next week as planned

## 2016-09-19 NOTE — Assessment & Plan Note (Signed)
bp in fair control at this time  BP Readings from Last 1 Encounters:  09/19/16 130/70   No changes needed Disc lifstyle change with low sodium diet and exercise  Better on 2nd check  Losartan refilled  Diltiazem managed by cardiology

## 2016-09-21 NOTE — Assessment & Plan Note (Signed)
Lab Results  Component Value Date   HGBA1C 5.7 09/15/2016   This is well controlled  disc imp of low glycemic diet and wt loss to prevent DM2

## 2016-09-21 NOTE — Assessment & Plan Note (Signed)
Now off fish oil Disc goals for lipids and reasons to control them Rev labs with pt Rev low sat fat diet in detail  Good control with diet

## 2016-09-22 ENCOUNTER — Other Ambulatory Visit: Payer: Self-pay | Admitting: Cardiovascular Disease

## 2016-09-23 ENCOUNTER — Ambulatory Visit (INDEPENDENT_AMBULATORY_CARE_PROVIDER_SITE_OTHER): Payer: Medicare Other

## 2016-09-23 VITALS — BP 124/80 | HR 88 | Temp 97.8°F | Ht 64.0 in | Wt 176.2 lb

## 2016-09-23 DIAGNOSIS — Z5181 Encounter for therapeutic drug level monitoring: Secondary | ICD-10-CM

## 2016-09-23 DIAGNOSIS — Z0000 Encounter for general adult medical examination without abnormal findings: Secondary | ICD-10-CM | POA: Diagnosis not present

## 2016-09-23 DIAGNOSIS — I481 Persistent atrial fibrillation: Secondary | ICD-10-CM | POA: Diagnosis not present

## 2016-09-23 DIAGNOSIS — I4819 Other persistent atrial fibrillation: Secondary | ICD-10-CM

## 2016-09-23 LAB — POCT INR: INR: 1.5

## 2016-09-23 NOTE — Progress Notes (Signed)
PCP notes:   Health maintenance:  Tetanus - postponed/insurance  Abnormal screenings:   Hearing - failed  Patient concerns:   None  Nurse concerns:  None  Next PCP appt:   N/A; CPE on 09/19/16\ I reviewed health advisor's note, was available for consultation, and agree with documentation and plan. Roxy MannsMarne Tower MD

## 2016-09-23 NOTE — Patient Instructions (Signed)
Pre visit review using our clinic review tool, if applicable. No additional management support is needed unless otherwise documented below in the visit note.  INR today 1.5  Patient denies any missed doses.  This Clinical research associatewriter continues to extensively review diet, health and medication history.  Other than the d/c of fish oil in December, patient denies any other change or deviation from regimen.  Patient is to take an extra 1/2 pill today and tomorrow (3/27 and 3/28) and then increase weekly dose to taking 1 pill (2.5mg ) every day EXCEPT for 1/2 pill (1.25mg ) on Mondays and recheck in 1 week.  She verbalizes understanding of all instructions given today and risks associated with subtherapeutic level, she will go to ER if any concerns develop.

## 2016-09-23 NOTE — Progress Notes (Signed)
Pre visit review using our clinic review tool, if applicable. No additional management support is needed unless otherwise documented below in the visit note. 

## 2016-09-23 NOTE — Progress Notes (Signed)
Subjective:   Linda Johnson is a 81 y.o. female who presents for Medicare Annual (Subsequent) preventive examination.  Review of Systems:  N/A Cardiac Risk Factors include: advanced age (>19men, >22 women);obesity (BMI >30kg/m2);dyslipidemia;hypertension     Objective:     Vitals: BP 124/80 (BP Location: Right Arm, Patient Position: Sitting, Cuff Size: Normal)   Pulse 88   Temp 97.8 F (36.6 C) (Oral)   Ht 5\' 4"  (1.626 m) Comment: no shoes  Wt 176 lb 4 oz (79.9 kg)   SpO2 94%   BMI 30.25 kg/m   Body mass index is 30.25 kg/m.   Tobacco History  Smoking Status  . Never Smoker  Smokeless Tobacco  . Never Used     Counseling given: No   Past Medical History:  Diagnosis Date  . Hypertension    Past Surgical History:  Procedure Laterality Date  . BREAST BIOPSY Left 2011   2 areas, cysts per pt   Family History  Problem Relation Age of Onset  . Leukemia Sister   . Breast cancer Maternal Aunt 50   History  Sexual Activity  . Sexual activity: No    Outpatient Encounter Prescriptions as of 09/23/2016  Medication Sig  . calcium-vitamin D (OSCAL WITH D) 500-200 MG-UNIT per tablet Take 1 tablet by mouth 2 (two) times daily.  . Cholecalciferol (VITAMIN D3) 2000 UNITS TABS Take 1 tablet by mouth 2 (two) times daily.   Marland Kitchen diltiazem (CARDIZEM CD) 120 MG 24 hr capsule TAKE 1 CAPSULE (120 MG TOTAL) BY MOUTH DAILY.  Marland Kitchen diltiazem (CARDIZEM CD) 240 MG 24 hr capsule TAKE 1 CAPSULE (240 MG TOTAL) BY MOUTH DAILY.  Marland Kitchen doxazosin (CARDURA) 8 MG tablet Take 1 tablet (8 mg total) by mouth 2 (two) times daily.  Marland Kitchen losartan (COZAAR) 100 MG tablet Take 1 tablet (100 mg total) by mouth daily.  Marland Kitchen warfarin (COUMADIN) 2.5 MG tablet TAKE AS DIRECTED BY ANTI-COAGULATION CLINIC.   No facility-administered encounter medications on file as of 09/23/2016.     Activities of Daily Living In your present state of health, do you have any difficulty performing the following activities: 09/23/2016    Hearing? Y  Vision? Y  Difficulty concentrating or making decisions? N  Walking or climbing stairs? N  Dressing or bathing? N  Doing errands, shopping? N  Preparing Food and eating ? N  Using the Toilet? N  In the past six months, have you accidently leaked urine? N  Do you have problems with loss of bowel control? N  Managing your Medications? N  Managing your Finances? N  Housekeeping or managing your Housekeeping? N  Some recent data might be hidden    Patient Care Team: Judy Pimple, MD as PCP - General Antonieta Iba, MD as Consulting Physician (Cardiology)    Assessment:     Hearing Screening   125Hz  250Hz  500Hz  1000Hz  2000Hz  3000Hz  4000Hz  6000Hz  8000Hz   Right ear:   0 0 0  0    Left ear:   0 0 0  40      Visual Acuity Screening   Right eye Left eye Both eyes  Without correction:     With correction: 20/50 20/50 20/50     Exercise Activities and Dietary recommendations Current Exercise Habits: Home exercise routine, Type of exercise: walking, Time (Minutes): 30, Frequency (Times/Week): 5, Weekly Exercise (Minutes/Week): 150, Intensity: Mild, Exercise limited by: None identified  Goals    . Eat more fruits and vegetables  Starting 09/23/16, I continue to eat at least 5 svgs of fresh fruits and vegetables daily.       Fall Risk Fall Risk  09/23/2016 09/13/2015 09/11/2014 09/09/2013 08/23/2012  Falls in the past year? No No No No Yes  Number falls in past yr: - - - - 1  Risk for fall due to : - History of fall(s) - - -  Risk for fall due to (comments): - fall approx 4 yrs ago with injury - - -   Depression Screen PHQ 2/9 Scores 09/23/2016 09/13/2015 09/11/2014 09/09/2013  PHQ - 2 Score 0 0 0 0     Cognitive Function MMSE - Mini Mental State Exam 09/23/2016 09/13/2015  Orientation to time 5 5  Orientation to Place 5 5  Registration 3 3  Attention/ Calculation 0 5  Recall 3 3  Language- name 2 objects 0 0  Language- repeat 1 1  Language- follow 3 step  command 3 3  Language- read & follow direction 0 1  Write a sentence 0 0  Copy design 0 0  Total score 20 26     PLEASE NOTE: A Mini-Cog screen was completed. Maximum score is 20. A value of 0 denotes this part of Folstein MMSE was not completed or the patient failed this part of the Mini-Cog screening.   Mini-Cog Screening Orientation to Time - Max 5 pts Orientation to Place - Max 5 pts Registration - Max 3 pts Recall - Max 3 pts Language Repeat - Max 1 pts Language Follow 3 Step Command - Max 3 pts     Immunization History  Administered Date(s) Administered  . Influenza Split 04/01/2011, 03/17/2012  . Influenza Whole 04/23/2006, 04/02/2007, 03/27/2008, 04/16/2009, 04/23/2010  . Influenza,inj,Quad PF,36+ Mos 03/24/2013, 04/10/2014, 04/13/2015, 03/19/2016  . Pneumococcal Conjugate-13 09/11/2014  . Pneumococcal Polysaccharide-23 04/16/2009  . Td 04/23/2006   Screening Tests Health Maintenance  Topic Date Due  . TETANUS/TDAP  04/22/2026 (Originally 04/23/2016)  . MAMMOGRAM  07/25/2017  . INFLUENZA VACCINE  Completed  . DEXA SCAN  Completed  . PNA vac Low Risk Adult  Completed      Plan:     I have personally reviewed and addressed the Medicare Annual Wellness questionnaire and have noted the following in the patient's chart:  A. Medical and social history B. Use of alcohol, tobacco or illicit drugs  C. Current medications and supplements D. Functional ability and status E.  Nutritional status F.  Physical activity G. Advance directives H. List of other physicians I.  Hospitalizations, surgeries, and ER visits in previous 12 months J.  Vitals K. Screenings to include hearing, vision, cognitive, depression L. Referrals and appointments - none  In addition, I have reviewed and discussed with patient certain preventive protocols, quality metrics, and best practice recommendations. A written personalized care plan for preventive services as well as general preventive  health recommendations were provided to patient.  See attached scanned questionnaire for additional information.   Signed,   Randa EvensLesia Pinson, MHA, BS, LPN Health Coach

## 2016-09-23 NOTE — Patient Instructions (Signed)
[name] , Thank you for taking time to come for your Medicare Wellness Visit. I appreciate your ongoing commitment to your health goals. Please review the following plan we discussed and let me know if I can assist you in the future.   These are the goals we discussed: Goals    . Eat more fruits and vegetables          Starting 09/23/16, I continue to eat at least 5 svgs of fresh fruits and vegetables daily.        This is a list of the screening recommended for you and due dates:  Health Maintenance  Topic Date Due  . Tetanus Vaccine  04/22/2026*  . Mammogram  07/25/2017  . Flu Shot  Completed  . DEXA scan (bone density measurement)  Completed  . Pneumonia vaccines  Completed  *Topic was postponed. The date shown is not the original due date.   Preventive Care for Adults  A healthy lifestyle and preventive care can promote health and wellness. Preventive health guidelines for adults include the following key practices.  . A routine yearly physical is a good way to check with your health care provider about your health and preventive screening. It is a chance to share any concerns and updates on your health and to receive a thorough exam.  . Visit your dentist for a routine exam and preventive care every 6 months. Brush your teeth twice a day and floss once a day. Good oral hygiene prevents tooth decay and gum disease.  . The frequency of eye exams is based on your age, health, family medical history, use  of contact lenses, and other factors. Follow your health care provider's ecommendations for frequency of eye exams.  . Eat a healthy diet. Foods like vegetables, fruits, whole grains, low-fat dairy products, and lean protein foods contain the nutrients you need without too many calories. Decrease your intake of foods high in solid fats, added sugars, and salt. Eat the right amount of calories for you. Get information about a proper diet from your health care provider, if  necessary.  . Regular physical exercise is one of the most important things you can do for your health. Most adults should get at least 150 minutes of moderate-intensity exercise (any activity that increases your heart rate and causes you to sweat) each week. In addition, most adults need muscle-strengthening exercises on 2 or more days a week.  Silver Sneakers may be a benefit available to you. To determine eligibility, you may visit the website: www.silversneakers.com or contact program at 236-422-88871-5756905353 Mon-Fri between 8AM-8PM.   . Maintain a healthy weight. The body mass index (BMI) is a screening tool to identify possible weight problems. It provides an estimate of body fat based on height and weight. Your health care provider can find your BMI and can help you achieve or maintain a healthy weight.   For adults 20 years and older: ? A BMI below 18.5 is considered underweight. ? A BMI of 18.5 to 24.9 is normal. ? A BMI of 25 to 29.9 is considered overweight. ? A BMI of 30 and above is considered obese.   . Maintain normal blood lipids and cholesterol levels by exercising and minimizing your intake of saturated fat. Eat a balanced diet with plenty of fruit and vegetables. Blood tests for lipids and cholesterol should begin at age 79 and be repeated every 5 years. If your lipid or cholesterol levels are high, you are over 50, or  you are at high risk for heart disease, you may need your cholesterol levels checked more frequently. Ongoing high lipid and cholesterol levels should be treated with medicines if diet and exercise are not working.  . If you smoke, find out from your health care provider how to quit. If you do not use tobacco, please do not start.  . If you choose to drink alcohol, please do not consume more than 2 drinks per day. One drink is considered to be 12 ounces (355 mL) of beer, 5 ounces (148 mL) of wine, or 1.5 ounces (44 mL) of liquor.  . If you are 75-19 years old, ask your  health care provider if you should take aspirin to prevent strokes.  . Use sunscreen. Apply sunscreen liberally and repeatedly throughout the day. You should seek shade when your shadow is shorter than you. Protect yourself by wearing long sleeves, pants, a wide-brimmed hat, and sunglasses year round, whenever you are outdoors.  . Once a month, do a whole body skin exam, using a mirror to look at the skin on your back. Tell your health care provider of new moles, moles that have irregular borders, moles that are larger than a pencil eraser, or moles that have changed in shape or color.

## 2016-09-29 ENCOUNTER — Other Ambulatory Visit: Payer: Self-pay | Admitting: Family Medicine

## 2016-09-30 ENCOUNTER — Ambulatory Visit (INDEPENDENT_AMBULATORY_CARE_PROVIDER_SITE_OTHER): Payer: Medicare Other

## 2016-09-30 ENCOUNTER — Ambulatory Visit: Payer: Medicare Other

## 2016-09-30 DIAGNOSIS — Z5181 Encounter for therapeutic drug level monitoring: Secondary | ICD-10-CM | POA: Diagnosis not present

## 2016-09-30 DIAGNOSIS — I4819 Other persistent atrial fibrillation: Secondary | ICD-10-CM

## 2016-09-30 DIAGNOSIS — I481 Persistent atrial fibrillation: Secondary | ICD-10-CM

## 2016-09-30 LAB — POCT INR: INR: 2

## 2016-09-30 NOTE — Patient Instructions (Signed)
Pre visit review using our clinic review tool, if applicable. No additional management support is needed unless otherwise documented below in the visit note. 

## 2016-10-27 ENCOUNTER — Ambulatory Visit
Admission: RE | Admit: 2016-10-27 | Discharge: 2016-10-27 | Disposition: A | Discharge status: Home / Self Care | Payer: Medicare Other | Source: Ambulatory Visit | Attending: Family Medicine | Admitting: Family Medicine

## 2016-10-27 DIAGNOSIS — M810 Age-related osteoporosis without current pathological fracture: Secondary | ICD-10-CM | POA: Insufficient documentation

## 2016-10-27 DIAGNOSIS — E2839 Other primary ovarian failure: Secondary | ICD-10-CM | POA: Insufficient documentation

## 2016-10-27 LAB — HM DEXA SCAN

## 2016-10-28 ENCOUNTER — Ambulatory Visit (INDEPENDENT_AMBULATORY_CARE_PROVIDER_SITE_OTHER): Payer: Medicare Other

## 2016-10-28 DIAGNOSIS — I4819 Other persistent atrial fibrillation: Secondary | ICD-10-CM

## 2016-10-28 DIAGNOSIS — I481 Persistent atrial fibrillation: Secondary | ICD-10-CM

## 2016-10-28 DIAGNOSIS — Z5181 Encounter for therapeutic drug level monitoring: Secondary | ICD-10-CM | POA: Diagnosis not present

## 2016-10-28 LAB — POCT INR: INR: 2.1

## 2016-10-28 NOTE — Patient Instructions (Signed)
Pre visit review using our clinic review tool, if applicable. No additional management support is needed unless otherwise documented below in the visit note. 

## 2016-10-30 ENCOUNTER — Encounter: Payer: Self-pay | Admitting: *Deleted

## 2016-11-28 ENCOUNTER — Ambulatory Visit (INDEPENDENT_AMBULATORY_CARE_PROVIDER_SITE_OTHER): Payer: Medicare Other

## 2016-11-28 ENCOUNTER — Telehealth: Payer: Self-pay

## 2016-11-28 DIAGNOSIS — Z5181 Encounter for therapeutic drug level monitoring: Secondary | ICD-10-CM

## 2016-11-28 DIAGNOSIS — I481 Persistent atrial fibrillation: Secondary | ICD-10-CM

## 2016-11-28 DIAGNOSIS — I4819 Other persistent atrial fibrillation: Secondary | ICD-10-CM

## 2016-11-28 LAB — POCT INR: INR: 2.1

## 2016-11-28 NOTE — Telephone Encounter (Signed)
While in today for coumadin check, patient verbalizes increasing concerns with balance and fear of falling.  She denies any dizziness, numbness or tingling to any extremities and otherwise feels fine without any other signs or neurologic deficits.  Patient reports symptoms are increasing over past few months with a noticeable change just this week.    Patient is walking slowly but stable with a cane.  She did ask me to assist her off of the curb when walking out to her car.  She states she just doesn't feel quite "sure" of herself.  I encouraged her to set up an office appointment with Dr. Milinda Antisower before she leaves today for evaluation of symptoms.  Also, suggested she consider some physical therapy to help her with balance and review appropriate assistive devices.    She refuses appointment and referral and states that she will let Dr. Milinda Antisower know if she is not getting any better.  Patient reassures me that she is eating a well-balanced diet and keeping herself well hydrated.  INR today is 2.1.     Will forward to Dr. Milinda Antisower as an Lorain ChildesFYI.

## 2016-11-28 NOTE — Patient Instructions (Signed)
Pre visit review using our clinic review tool, if applicable. No additional management support is needed unless otherwise documented below in the visit note. 

## 2016-11-28 NOTE — Telephone Encounter (Signed)
Thanks for letting me know - I highly recommend f/u and PT if she becomes agreeable to that later Thanks

## 2016-12-10 NOTE — Progress Notes (Signed)
Cardiology Office Note  Date:  12/11/2016   ID:  Linda CousinsLinda R Johnson, DOB 09/04/1935, MRN [id number]  PCP:  Linda Johnson, Linda A, MD   Chief Complaint  Patient presents with  . other    6 month follow up. Patient denies chest pain and SOB. Meds reviewed verbally with patient.     HPI:  [name] is Johnson [age] year old woman with Johnson hx of  atrial fibrillation, chronic, On anticoagulation hypertension who presents for routine follow-up of her arrhythmia  Fear of falling,  Nervous to go otside Walks with Johnson cane,  Even with cane  Having trouble Does not want Johnson walker,  Makes her feel old Feels safer with  Shopping cart at the  [employer],  Feels  She can move muc faster with cnidence  On cardura 4 BID, diltiazem twice Johnson day BP well controlled at home, 120 to 130 systolic Nervous on today's visit Today she presents by herself, daughter is not present Denies any significant shortness of breath on exertion Does not appreciate her atrial fibrillation No regular exercise program  Lab work reviewed with her in detail Total chol 152, LDL 87  EKG on today's visit shows atrial fibrillation with ventricular rate 86 bpm, PVCs, nonspecific ST abnormality  Other past medical history reviewed On Johnson previous clinic visit, she was started on amiodarone in an attempt to cardiovert In follow-up she Declined cardioversion  Previously felt she had nausea from the low-dose amiodarone  Atrial fibrillation picked up by primary care on routine EKG early 2017   PMH:   has Johnson past medical history of Hypertension.  PSH:    Past Surgical History:  Procedure Laterality Date  . BREAST BIOPSY Left 2011   2 areas, cysts per pt    Current Outpatient Prescriptions  Medication Sig Dispense Refill  . calcium-vitamin D (OSCAL WITH D) 500-200 MG-UNIT per tablet Take 1 tablet by mouth 2 (two) times daily.    . Cholecalciferol (VITAMIN D3) 2000 UNITS TABS Take 1 tablet by mouth 2 (two) times daily.     Marland Kitchen. diltiazem  (CARDIZEM CD) 120 MG 24 hr capsule TAKE 1 CAPSULE (120 MG TOTAL) BY MOUTH DAILY. 30 capsule 3  . diltiazem (CARDIZEM CD) 240 MG 24 hr capsule TAKE 1 CAPSULE (240 MG TOTAL) BY MOUTH DAILY. 30 capsule 3  . doxazosin (CARDURA) 8 MG tablet Take 1 tablet (8 mg total) by mouth 2 (two) times daily. 60 tablet 6  . latanoprost (XALATAN) 0.005 % ophthalmic solution INSTILL ONE DROP IN BOTH EYES AT BEDTIME.  3  . losartan (COZAAR) 100 MG tablet Take 1 tablet (100 mg total) by mouth daily. 90 tablet 3  . warfarin (COUMADIN) 2.5 MG tablet TAKE AS DIRECTED BY ANTI-COAGULATION CLINIC. 90 tablet 3   No current facility-administered medications for this visit.      Allergies:   Ace inhibitors; Hydrocodone-homatropine; and Tramadol hcl   Social History:  The patient  reports that she has never smoked. She has never used smokeless tobacco. She reports that she does not drink alcohol or use drugs.   Family History:   family history includes Breast cancer (age of onset: 1050) in her maternal aunt; Leukemia in her sister.    Review of Systems: Review of Systems  Constitutional: Negative.   Respiratory: Negative.   Cardiovascular: Negative.   Gastrointestinal: Negative.   Musculoskeletal: Negative.        Gait instability  Neurological: Negative.   Psychiatric/Behavioral: The patient is nervous/anxious.  All other systems reviewed and are negative.    PHYSICAL EXAM: VS:  BP (!) 152/70 (BP Location: Left Arm, Patient Position: Sitting, Cuff Size: Normal)   Pulse 86   Ht 5\' 5"  (1.651 m)   Wt 174 lb 12 oz (79.3 kg)   BMI 29.08 kg/m  , BMI Body mass index is 29.08 kg/m. GEN: Well nourished, well developed, in no acute distress, obese , significant trouble getting down from the exam table and walking with Johnson cane HEENT: normal  Neck: no JVD, carotid bruits, or masses Cardiac: Irregularly irregular, no murmurs, rubs, or gallops,no edema  Respiratory:  clear to auscultation bilaterally, normal work of  breathing GI: soft, nontender, nondistended, + BS MS: no deformity or atrophy  Skin: warm and dry, no rash Neuro:  Strength and sensation are intact Psych: euthymic mood, full affect    Recent Labs: 09/15/2016: ALT 10; BUN 20; Creatinine, Ser 1.04; Hemoglobin 14.0; Platelets 222.0; Potassium 4.8; Sodium 137; TSH 4.36    Lipid Panel Lab Results  Component Value Date   CHOL 152 09/15/2016   HDL 50.00 09/15/2016   LDLCALC 87 09/15/2016   TRIG 76.0 09/15/2016      Wt Readings from Last 3 Encounters:  12/11/16 174 lb 12 oz (79.3 kg)  09/23/16 176 lb 4 oz (79.9 kg)  09/19/16 174 lb 4 oz (79 kg)       ASSESSMENT AND PLAN:  Persistent atrial fibrillation (HCC) - Plan: EKG 12-Lead Rate well controlled Tolerating warfarin, INR well controlled. Was low in March  Essential hypertension Initially elevated on arrival then improved on recheck No changes made to the medications.  Mixed hyperlipidemia Currently not on Johnson statin, cholesterol reasonably well controlled  Encounter for anticoagulation discussion and counseling She is happy to stay on warfarin Overall doing well with no complications No recent falls  Gait instability Very fearful she is going to fall Strongly recommended she consider using Johnson walker Unstable on her feet with Johnson cane She is worried walker will make her look old No regular exercise program. Balance issues She does not want to attend exercise class or balance class The above was discussed with her in detail, stressed the importance of daily exercise and activities for leg strength and balance to minimize risk of fall    Total encounter time more than 25 minutes  Greater than 50% was spent in counseling and coordination of care with the patient   Disposition:   F/U  12 months   Orders Placed This Encounter  Procedures  . EKG 12-Lead     Signed, Dossie Arbour, M.D., Ph.D. 12/11/2016  [facility] [place],  [place] [phone]

## 2016-12-11 ENCOUNTER — Ambulatory Visit (INDEPENDENT_AMBULATORY_CARE_PROVIDER_SITE_OTHER): Payer: Medicare Other | Admitting: Cardiovascular Disease

## 2016-12-11 ENCOUNTER — Encounter: Payer: Self-pay | Admitting: Cardiovascular Disease

## 2016-12-11 VITALS — BP 138/70 | HR 86 | Ht 65.0 in | Wt 174.8 lb

## 2016-12-11 DIAGNOSIS — Z7189 Other specified counseling: Secondary | ICD-10-CM | POA: Diagnosis not present

## 2016-12-11 DIAGNOSIS — I482 Chronic atrial fibrillation, unspecified: Secondary | ICD-10-CM

## 2016-12-11 DIAGNOSIS — E782 Mixed hyperlipidemia: Secondary | ICD-10-CM | POA: Diagnosis not present

## 2016-12-11 DIAGNOSIS — I10 Essential (primary) hypertension: Secondary | ICD-10-CM

## 2016-12-11 NOTE — Patient Instructions (Addendum)

## 2016-12-26 ENCOUNTER — Ambulatory Visit (INDEPENDENT_AMBULATORY_CARE_PROVIDER_SITE_OTHER): Payer: Medicare Other

## 2016-12-26 ENCOUNTER — Ambulatory Visit: Payer: Medicare Other

## 2016-12-26 DIAGNOSIS — Z5181 Encounter for therapeutic drug level monitoring: Secondary | ICD-10-CM | POA: Diagnosis not present

## 2016-12-26 DIAGNOSIS — I482 Chronic atrial fibrillation, unspecified: Secondary | ICD-10-CM

## 2016-12-26 LAB — POCT INR: INR: 2.2

## 2016-12-26 NOTE — Patient Instructions (Signed)
Pre visit review using our clinic review tool, if applicable. No additional management support is needed unless otherwise documented below in the visit note. 

## 2017-01-03 ENCOUNTER — Other Ambulatory Visit: Payer: Self-pay | Admitting: Cardiovascular Disease

## 2017-01-14 ENCOUNTER — Ambulatory Visit: Payer: Medicare Other | Admitting: Family Medicine

## 2017-01-19 ENCOUNTER — Telehealth: Payer: Self-pay

## 2017-01-19 NOTE — Telephone Encounter (Signed)
Pt has called and asked for a letter to give to the post office for them to deliver mail up to her house. She said it required a letter from Dr Milinda Antisower. Any questions please call [phone].

## 2017-01-19 NOTE — Telephone Encounter (Signed)
Letter is in the IN box 

## 2017-01-20 NOTE — Telephone Encounter (Signed)
Lm on pts vm and informed her letter is available for pickup from the front desk 

## 2017-01-23 ENCOUNTER — Other Ambulatory Visit: Payer: Self-pay | Admitting: Cardiovascular Disease

## 2017-01-27 ENCOUNTER — Ambulatory Visit (INDEPENDENT_AMBULATORY_CARE_PROVIDER_SITE_OTHER): Payer: Medicare Other

## 2017-01-27 DIAGNOSIS — Z5181 Encounter for therapeutic drug level monitoring: Secondary | ICD-10-CM

## 2017-01-27 DIAGNOSIS — I482 Chronic atrial fibrillation, unspecified: Secondary | ICD-10-CM

## 2017-01-27 LAB — POCT INR: INR: 1.9

## 2017-01-27 NOTE — Patient Instructions (Signed)
Pre visit review using our clinic review tool, if applicable. No additional management support is needed unless otherwise documented below in the visit note. 

## 2017-01-29 ENCOUNTER — Ambulatory Visit: Payer: Medicare Other | Admitting: Family Medicine

## 2017-02-01 ENCOUNTER — Other Ambulatory Visit: Payer: Self-pay | Admitting: Cardiovascular Disease

## 2017-02-02 ENCOUNTER — Ambulatory Visit (INDEPENDENT_AMBULATORY_CARE_PROVIDER_SITE_OTHER): Payer: Medicare Other | Admitting: Family Medicine

## 2017-02-02 ENCOUNTER — Encounter: Payer: Self-pay | Admitting: Family Medicine

## 2017-02-02 ENCOUNTER — Ambulatory Visit (INDEPENDENT_AMBULATORY_CARE_PROVIDER_SITE_OTHER)
Admission: RE | Admit: 2017-02-02 | Discharge: 2017-02-02 | Disposition: A | Discharge status: Home / Self Care | Payer: Medicare Other | Source: Ambulatory Visit | Attending: Family Medicine | Admitting: Family Medicine

## 2017-02-02 VITALS — BP 130/62 | HR 113 | Temp 98.0°F | Ht 64.0 in | Wt 177.0 lb

## 2017-02-02 DIAGNOSIS — M79605 Pain in left leg: Secondary | ICD-10-CM

## 2017-02-02 DIAGNOSIS — M810 Age-related osteoporosis without current pathological fracture: Secondary | ICD-10-CM | POA: Diagnosis not present

## 2017-02-02 NOTE — Progress Notes (Signed)
Dr. Karleen Hampshire T. Daxon Kyne, MD, CAQ Sports [facility] Sports [facility] [address] [place], [address] Phone: [phone] Fax: [phone]  02/02/2017  Patient: Linda Johnson, MRN: [id number], DOB: [birth year], 81 y.o.  Primary Physician:  Tower, Audrie Gallus, MD   Chief Complaint  Patient presents with  . Ankle Pain    Left-Weak-trouble walking   Subjective:   Linda Johnson is a 81 y.o. very pleasant female patient who presents with the following:   81 year old with a known history of osteoporosis on calcium and vitamin D who presents with a 3 week history of left-sided fifth metatarsal shaft pain. More on the outside. No known injury. Been hurting for a few weeks.  She describes this as ankle pain. She has no pain in the medial or lateral malleolus. No pain in the talus.  5th MT shaft on the left.   Past Medical History, Surgical History, Social History, Family History, Problem List, Medications, and Allergies have been reviewed and updated if relevant.  Patient Active Problem List   Diagnosis Date Noted  . Cerumen impaction 09/19/2016  . Encounter for therapeutic drug monitoring 09/24/2015  . Encounter for anticoagulation discussion and counseling 09/20/2015  . Irregular heart rate 09/17/2015  . Atrial fibrillation (HCC) 09/17/2015  . Hearing loss 09/17/2015  . Hypothyroidism 12/12/2014  . Hyperglycemia 09/11/2014  . Encounter for Medicare annual wellness exam 09/11/2014  . Estrogen deficiency 09/11/2014  . Pyogenic granuloma 06/06/2014  . Routine general medical examination at a health care facility 09/09/2013  . Low back pain 03/21/2013  . Other screening mammogram 04/19/2012  . Post-menopausal 04/19/2012  . Obesity 08/18/2011  . Hyperlipidemia 06/12/2010  . Osteoporosis 06/12/2010  . ALLERGIC RHINITIS CAUSE UNSPECIFIED 04/12/2008  . HX, PERSONAL, COLONIC POLYPS 04/02/2007  . STRABISMUS 03/02/2007  . Essential hypertension 03/02/2007    Past  Medical History:  Diagnosis Date  . Hypertension     Past Surgical History:  Procedure Laterality Date  . BREAST BIOPSY Left 2011   2 areas, cysts per pt    Social History   Social History  . Marital status: Divorced    Spouse name: N/A  . Number of children: N/A  . Years of education: N/A   Occupational History  . Not on file.   Social History Main Topics  . Smoking status: Never Smoker  . Smokeless tobacco: Never Used  . Alcohol use No  . Drug use: No  . Sexual activity: No   Other Topics Concern  . Not on file   Social History Narrative  . No narrative on file    Family History  Problem Relation Age of Onset  . Leukemia Sister   . Breast cancer Maternal Aunt 50    Allergies  Allergen Reactions  . Ace Inhibitors     REACTION: cough  . Hydrocodone-Homatropine Nausea Only  . Tramadol Hcl     nausea    Medication list reviewed and updated in full in [facility].  GEN: No fevers, chills. Nontoxic. Primarily MSK c/o today. MSK: Detailed in the HPI GI: tolerating PO intake without difficulty Neuro: No numbness, parasthesias, or tingling associated. Otherwise the pertinent positives of the ROS are noted above.   Objective:   BP 130/62   Pulse (!) 113   Temp 98 F (36.7 C) (Oral)   Ht 5\' 4"  (1.626 m)   Wt 177 lb (80.3 kg)   BMI 30.38 kg/m    GEN: WDWN, NAD, Non-toxic,  Alert & Oriented x 3 HEENT: Atraumatic, Normocephalic.  Ears and Nose: No external deformity. EXTR: No clubbing/cyanosis/edema NEURO: Normal gait.  PSYCH: Normally interactive. Conversant. Not depressed or anxious appearing.  Calm demeanor.   FEET: L Echymosis: no Edema: no ROM: full LE B Gait: heel toe, non-antalgic MT pain: pain along 5th MT shaft Callus pattern: none Lateral Mall: NT Medial Mall: NT Talus: NT Navicular: NT Cuboid: NT Calcaneous: NT Phalanges: NT Achilles: NT Plantar Fascia: NT Fat Pad: NT Peroneals: NT Post Tib: NT Great Toe: Nml  motion Ant Drawer: neg ATFL: NT CFL: NT Deltoid: NT Other foot breakdown: none Long arch: preserved Transverse arch: preserved Hindfoot breakdown: none Sensation: intact  str 4+/5  Radiology: No results found.  Assessment and Plan:   Pain of left lower extremity - Plan: DG Foot Complete Left  Age-related osteoporosis without current pathological fracture  I see no evidence of acute fracture.  Clinically, examination consistent with stress reaction versus stress fracture, in an osteoporotic patient. Certainly we could get a MRI to confirm, but at this point the patient and I are comfortable with managing this conservatively. I will place her in a postoperative shoe and recheck in one month.  Follow-up: Return in about 1 month (around 03/05/2017).  Future Appointments Date Time Provider Department Center  02/24/2017 11:00 AM [facility]  09/29/2017 10:30 AM Robert Bellowinson, Tarlesia R, LPN [facility]  10/05/2017 11:30 AM Tower, Audrie GallusMarne A, MD [facility]   Orders Placed This Encounter  Procedures  . DG Foot Complete Left    Signed,  Ryah Cribb T. Braylinn Gulden, MD   Allergies as of 02/02/2017      Reactions   Ace Inhibitors    REACTION: cough   Hydrocodone-homatropine Nausea Only   Tramadol Hcl    nausea      Medication List       Accurate as of 02/02/17 12:09 PM. Always use your most recent med list.          calcium-vitamin D 500-200 MG-UNIT tablet Commonly known as:  OSCAL WITH D Take 1 tablet by mouth 2 (two) times daily.   diltiazem 240 MG 24 hr capsule Commonly known as:  CARDIZEM CD TAKE 1 CAPSULE (240 MG TOTAL) BY MOUTH DAILY.   diltiazem 120 MG 24 hr capsule Commonly known as:  CARDIZEM CD TAKE 1 CAPSULE (120 MG TOTAL) BY MOUTH DAILY.   doxazosin 8 MG tablet Commonly known as:  CARDURA Take 1 tablet (8 mg total) by mouth 2 (two) times daily.   latanoprost 0.005 % ophthalmic solution Commonly known as:   XALATAN INSTILL ONE DROP IN BOTH EYES AT BEDTIME.   losartan 100 MG tablet Commonly known as:  COZAAR Take 1 tablet (100 mg total) by mouth daily.   Vitamin D3 2000 units Tabs Take 1 tablet by mouth 2 (two) times daily.   warfarin 2.5 MG tablet Commonly known as:  COUMADIN TAKE AS DIRECTED BY ANTI-COAGULATION CLINIC.

## 2017-02-24 ENCOUNTER — Ambulatory Visit (INDEPENDENT_AMBULATORY_CARE_PROVIDER_SITE_OTHER): Payer: Medicare Other

## 2017-02-24 DIAGNOSIS — Z5181 Encounter for therapeutic drug level monitoring: Secondary | ICD-10-CM | POA: Diagnosis not present

## 2017-02-24 DIAGNOSIS — I482 Chronic atrial fibrillation, unspecified: Secondary | ICD-10-CM

## 2017-02-24 LAB — POCT INR: INR: 2.6

## 2017-02-24 NOTE — Patient Instructions (Signed)
Pre visit review using our clinic review tool, if applicable. No additional management support is needed unless otherwise documented below in the visit note. 

## 2017-03-03 ENCOUNTER — Telehealth: Payer: Self-pay | Admitting: Family Medicine

## 2017-03-03 NOTE — Telephone Encounter (Signed)
Patient asked for Lupita LeashDonna to call her back about her charge from [facility].

## 2017-03-03 NOTE — Telephone Encounter (Signed)
Spoke with Linda Johnson.  Advised that she should not have gotten a bill from [facility].  Patient paid $15 on [employer] Card for post op shoe which is the cash discounted price.  I have left a message for Alycia RossettiRyan our [employer] rep to return my call to straightened this out.

## 2017-03-04 NOTE — Telephone Encounter (Signed)
Ryan with [employer] notified of issue.  He will check into this for me.

## 2017-03-06 NOTE — Telephone Encounter (Addendum)
Spoke with [employer].  Apparently there has been a price increase which we were not aware of on the post-op shoes but they are working on accepting the $15 that the patient paid and write the other off.  He will let me know for sure if they were able to do that.  Alycia RossettiRyan is also emailing me a new price list.  Left message for [name] advising her of this information.

## 2017-03-11 ENCOUNTER — Ambulatory Visit: Payer: Medicare Other | Admitting: Family Medicine

## 2017-03-25 ENCOUNTER — Ambulatory Visit (INDEPENDENT_AMBULATORY_CARE_PROVIDER_SITE_OTHER): Payer: Medicare Other | Admitting: Family Medicine

## 2017-03-25 ENCOUNTER — Ambulatory Visit (INDEPENDENT_AMBULATORY_CARE_PROVIDER_SITE_OTHER): Payer: Medicare Other

## 2017-03-25 ENCOUNTER — Encounter: Payer: Self-pay | Admitting: Family Medicine

## 2017-03-25 VITALS — BP 130/78 | HR 96 | Temp 97.5°F | Wt 179.0 lb

## 2017-03-25 DIAGNOSIS — Z5181 Encounter for therapeutic drug level monitoring: Secondary | ICD-10-CM | POA: Diagnosis not present

## 2017-03-25 DIAGNOSIS — I482 Chronic atrial fibrillation, unspecified: Secondary | ICD-10-CM

## 2017-03-25 DIAGNOSIS — Z23 Encounter for immunization: Secondary | ICD-10-CM | POA: Diagnosis not present

## 2017-03-25 DIAGNOSIS — M79605 Pain in left leg: Secondary | ICD-10-CM | POA: Diagnosis not present

## 2017-03-25 LAB — POCT INR: INR: 2.1

## 2017-03-25 NOTE — Progress Notes (Signed)
Dr. Karleen Johnson T. Linda Cottone, MD, CAQ Sports [facility] [address] [place], [address] Phone: [phone] Fax: [phone]  03/25/2017  Patient: Linda Johnson, MRN: [id number], DOB: [birth year], 81 y.o.  Primary Physician:  Johnson, Linda Gallus, MD   Chief Complaint  Patient presents with  . Follow-up    left ankle pain--pt reports she is doing better   Subjective:   Linda Johnson is a 81 y.o. very pleasant female patient who presents with the following:  6 week f/u 5th MT pain: probable stress reaction in post-op shoe x 6 weeks and now completely better  better  Past Medical History, Surgical History, Social History, Family History, Problem List, Medications, and Allergies have been reviewed and updated if relevant.  Patient Active Problem List   Diagnosis Date Noted  . Cerumen impaction 09/19/2016  . Encounter for therapeutic drug monitoring 09/24/2015  . Encounter for anticoagulation discussion and counseling 09/20/2015  . Irregular heart rate 09/17/2015  . Atrial fibrillation (HCC) 09/17/2015  . Hearing loss 09/17/2015  . Hypothyroidism 12/12/2014  . Hyperglycemia 09/11/2014  . Encounter for Medicare annual wellness exam 09/11/2014  . Estrogen deficiency 09/11/2014  . Pyogenic granuloma 06/06/2014  . Routine general medical examination at a health care facility 09/09/2013  . Low back pain 03/21/2013  . Other screening mammogram 04/19/2012  . Post-menopausal 04/19/2012  . Obesity 08/18/2011  . Hyperlipidemia 06/12/2010  . Osteoporosis 06/12/2010  . ALLERGIC RHINITIS CAUSE UNSPECIFIED 04/12/2008  . HX, PERSONAL, COLONIC POLYPS 04/02/2007  . STRABISMUS 03/02/2007  . Essential hypertension 03/02/2007    Past Medical History:  Diagnosis Date  . Hypertension     Past Surgical History:  Procedure Laterality Date  . BREAST BIOPSY Left 2011   2 areas, cysts per pt    Social History   Social History  . Marital status: Divorced     Spouse name: N/A  . Number of children: N/A  . Years of education: N/A   Occupational History  . Not on file.   Social History Main Topics  . Smoking status: Never Smoker  . Smokeless tobacco: Never Used  . Alcohol use No  . Drug use: No  . Sexual activity: No   Other Topics Concern  . Not on file   Social History Narrative  . No narrative on file    Family History  Problem Relation Age of Onset  . Leukemia Sister   . Breast cancer Maternal Aunt 50    Allergies  Allergen Reactions  . Ace Inhibitors     REACTION: cough  . Hydrocodone-Homatropine Nausea Only  . Tramadol Hcl     nausea    Medication list reviewed and updated in full in [facility].  GEN: No fevers, chills. Nontoxic. Primarily MSK c/o today. MSK: Detailed in the HPI GI: tolerating PO intake without difficulty Neuro: No numbness, parasthesias, or tingling associated. Otherwise the pertinent positives of the ROS are noted above.   Objective:   BP 130/78   Pulse 96   Temp (!) 97.5 F (36.4 C) (Oral)   Wt 179 lb (81.2 kg)   SpO2 97%   BMI 30.73 kg/m    GEN: WDWN, NAD, Non-toxic, Alert & Oriented x 3 HEENT: Atraumatic, Normocephalic.  Ears and Nose: No external deformity. EXTR: No clubbing/cyanosis/edema NEURO: Normal gait.  PSYCH: Normally interactive. Conversant. Not depressed or anxious appearing.  Calm demeanor.   FEET: l Echymosis: no Edema: no ROM: full LE B  Gait: heel toe, non-antalgic MT pain: no Callus pattern: none Lateral Mall: NT Medial Mall: NT Talus: NT Navicular: NT Cuboid: NT Calcaneous: NT Metatarsals: NT 5th MT: NT Phalanges: NT Achilles: NT Plantar Fascia: NT Fat Pad: NT Peroneals: NT Post Tib: NT Great Toe: Nml motion Ant Drawer: neg ATFL: NT CFL: NT Deltoid: NT Other foot breakdown: none Long arch: preserved Transverse arch: preserved Hindfoot breakdown: none Sensation: intact   Radiology: No results found.  Assessment and Plan:    Pain of left lower extremity  Healed L 5th stress reaction MT shaft - now asx  Follow-up: No Follow-up on file.  Future Appointments Date Time Provider Department Center  09/29/2017 10:30 AM Robert Bellow, LPN [facility]  10/05/2017 11:30 AM Johnson, Linda Gallus, MD [facility]   Signed,  Elpidio Galea. Lillybeth Tal, MD   Patient's Medications  New Prescriptions   No medications on file  Previous Medications   CALCIUM-VITAMIN D (OSCAL WITH D) 500-200 MG-UNIT PER TABLET    Take 1 tablet by mouth 2 (two) times daily.   CHOLECALCIFEROL (VITAMIN D3) 2000 UNITS TABS    Take 1 tablet by mouth 2 (two) times daily.    DILTIAZEM (CARDIZEM CD) 120 MG 24 HR CAPSULE    TAKE 1 CAPSULE (120 MG TOTAL) BY MOUTH DAILY.   DILTIAZEM (CARDIZEM CD) 240 MG 24 HR CAPSULE    TAKE 1 CAPSULE (240 MG TOTAL) BY MOUTH DAILY.   DOXAZOSIN (CARDURA) 8 MG TABLET    Take 1 tablet (8 mg total) by mouth 2 (two) times daily.   LATANOPROST (XALATAN) 0.005 % OPHTHALMIC SOLUTION    INSTILL ONE DROP IN BOTH EYES AT BEDTIME.   LOSARTAN (COZAAR) 100 MG TABLET    Take 1 tablet (100 mg total) by mouth daily.   WARFARIN (COUMADIN) 2.5 MG TABLET    TAKE AS DIRECTED BY ANTI-COAGULATION CLINIC.  Modified Medications   No medications on file  Discontinued Medications   No medications on file

## 2017-03-25 NOTE — Patient Instructions (Signed)
Pre visit review using our clinic review tool, if applicable. No additional management support is needed unless otherwise documented below in the visit note. 

## 2017-03-28 ENCOUNTER — Other Ambulatory Visit: Payer: Self-pay | Admitting: Cardiovascular Disease

## 2017-04-01 ENCOUNTER — Telehealth: Payer: Self-pay | Admitting: Family Medicine

## 2017-04-01 NOTE — Telephone Encounter (Signed)
Caller Name:Samayah Macphee Relationship to Patient:self Best [phone] Pharmacy:  Reason for call:  Pt would like call back regarding shoe that Dr Patsy Lager gave her.

## 2017-04-01 NOTE — Telephone Encounter (Signed)
Spoke with Linda Johnson.  She states she has gotten another bill for $9.00 for the post op shoe.  I advised that [employer] had a price increase on the post op shoes that we were unware of and the cash discount price is now $24 and that is why she got the bill for the additional $9.00.  I have left a message with Alycia Rossetti our [employer] rep to ask them to write off the $9.00 since we were not informed of the price increase at the time the patient received her shoe.

## 2017-04-03 ENCOUNTER — Other Ambulatory Visit: Payer: Self-pay | Admitting: Family Medicine

## 2017-04-03 NOTE — Telephone Encounter (Signed)
Routing to the coumadin clinic nurse

## 2017-04-03 NOTE — Telephone Encounter (Signed)
Patient is compliant with coumadin management will refill X 3 months. 

## 2017-05-04 ENCOUNTER — Other Ambulatory Visit: Payer: Self-pay | Admitting: Cardiovascular Disease

## 2017-05-05 ENCOUNTER — Ambulatory Visit (INDEPENDENT_AMBULATORY_CARE_PROVIDER_SITE_OTHER): Payer: Medicare Other

## 2017-05-05 DIAGNOSIS — I482 Chronic atrial fibrillation, unspecified: Secondary | ICD-10-CM

## 2017-05-05 DIAGNOSIS — Z5181 Encounter for therapeutic drug level monitoring: Secondary | ICD-10-CM

## 2017-05-05 LAB — POCT INR: INR: 2.2

## 2017-05-05 NOTE — Patient Instructions (Signed)
Pre visit review using our clinic review tool, if applicable. No additional management support is needed unless otherwise documented below in the visit note. 

## 2017-05-23 ENCOUNTER — Other Ambulatory Visit: Payer: Self-pay | Admitting: Cardiovascular Disease

## 2017-06-15 ENCOUNTER — Other Ambulatory Visit: Payer: Self-pay | Admitting: Family Medicine

## 2017-06-15 DIAGNOSIS — Z1231 Encounter for screening mammogram for malignant neoplasm of breast: Secondary | ICD-10-CM

## 2017-06-16 ENCOUNTER — Ambulatory Visit (INDEPENDENT_AMBULATORY_CARE_PROVIDER_SITE_OTHER): Payer: Medicare Other

## 2017-06-16 DIAGNOSIS — I4891 Unspecified atrial fibrillation: Secondary | ICD-10-CM

## 2017-06-16 DIAGNOSIS — Z5181 Encounter for therapeutic drug level monitoring: Secondary | ICD-10-CM

## 2017-06-16 DIAGNOSIS — I482 Chronic atrial fibrillation, unspecified: Secondary | ICD-10-CM

## 2017-06-16 LAB — POCT INR: INR: 2.2

## 2017-06-16 NOTE — Patient Instructions (Signed)
NR TODAY 2.2  Continue taking 1 pill (2.5mg ) daily EXCEPT for 1/2 pill on mondays.  Recheck in 6 weeks.    Patient is doing well without any recent changes to diet, health or medications.

## 2017-07-04 ENCOUNTER — Other Ambulatory Visit: Payer: Self-pay | Admitting: Family Medicine

## 2017-07-06 NOTE — Telephone Encounter (Signed)
Patient is compliant with coumadin management, will refill X 6 months.   

## 2017-07-06 NOTE — Telephone Encounter (Signed)
Routing to coumadin clinic nurse  

## 2017-07-28 ENCOUNTER — Ambulatory Visit (INDEPENDENT_AMBULATORY_CARE_PROVIDER_SITE_OTHER): Payer: Medicare Other

## 2017-07-28 DIAGNOSIS — Z5181 Encounter for therapeutic drug level monitoring: Secondary | ICD-10-CM

## 2017-07-28 DIAGNOSIS — I4891 Unspecified atrial fibrillation: Secondary | ICD-10-CM | POA: Diagnosis not present

## 2017-07-28 DIAGNOSIS — I482 Chronic atrial fibrillation, unspecified: Secondary | ICD-10-CM

## 2017-07-28 LAB — POCT INR: INR: 2

## 2017-07-28 NOTE — Patient Instructions (Signed)
INR TODAY 2.0 Continue taking 1 pill (2.5mg ) daily EXCEPT for 1/2 pill on mondays.  Recheck in 6 weeks.    Patient is doing well without any recent changes to diet, health or medications.

## 2017-08-07 ENCOUNTER — Ambulatory Visit
Admission: RE | Admit: 2017-08-07 | Discharge: 2017-08-07 | Disposition: A | Discharge status: Home / Self Care | Payer: Medicare Other | Source: Ambulatory Visit | Attending: Family Medicine | Admitting: Family Medicine

## 2017-08-07 DIAGNOSIS — Z1231 Encounter for screening mammogram for malignant neoplasm of breast: Secondary | ICD-10-CM | POA: Diagnosis not present

## 2017-08-27 ENCOUNTER — Other Ambulatory Visit: Payer: Self-pay | Admitting: Cardiovascular Disease

## 2017-08-28 DIAGNOSIS — B029 Zoster without complications: Secondary | ICD-10-CM

## 2017-08-28 HISTORY — DX: Zoster without complications: B02.9

## 2017-09-04 ENCOUNTER — Other Ambulatory Visit: Payer: Self-pay | Admitting: Cardiovascular Disease

## 2017-09-04 ENCOUNTER — Other Ambulatory Visit: Payer: Self-pay

## 2017-09-04 ENCOUNTER — Telehealth: Payer: Self-pay | Admitting: Cardiovascular Disease

## 2017-09-04 MED ORDER — DILTIAZEM HCL ER COATED BEADS 120 MG PO CP24: 120.0000 mg | ORAL_CAPSULE | Freq: Every day | ORAL | 3 refills | Status: DC

## 2017-09-04 NOTE — Telephone Encounter (Signed)
[facility]

## 2017-09-08 ENCOUNTER — Ambulatory Visit (INDEPENDENT_AMBULATORY_CARE_PROVIDER_SITE_OTHER): Payer: Medicare Other

## 2017-09-08 DIAGNOSIS — I4891 Unspecified atrial fibrillation: Secondary | ICD-10-CM | POA: Diagnosis not present

## 2017-09-08 DIAGNOSIS — Z5181 Encounter for therapeutic drug level monitoring: Secondary | ICD-10-CM

## 2017-09-08 DIAGNOSIS — I482 Chronic atrial fibrillation, unspecified: Secondary | ICD-10-CM

## 2017-09-08 LAB — POCT INR: INR: 2.6

## 2017-09-08 NOTE — Patient Instructions (Addendum)
INR TODAY 2.6  Continue taking 1 pill (2.5mg ) daily EXCEPT for 1/2 pill on mondays.  Recheck in 6 weeks.   *patient requests to check level at next office visit on 4/2  Patient is doing well without any recent changes to diet, health or medications.  She verbalizes understanding of all instructions given today.

## 2017-09-16 ENCOUNTER — Ambulatory Visit: Payer: Self-pay

## 2017-09-16 NOTE — Telephone Encounter (Signed)
Pt woke up yesterday with sorenes to the right side of the right side and below the right shoulder blade. States pain is mild and constant. Pt took one 500 mg acetaminophen pill this am and stated she did get some relief. Pain does not shoot anywhere. Home care advice provided. Looked up BP meds and coumadin and acetaminophen and noted increase risk of bleeding.. Pt called and notified.  Pt is asking for "something generic" to be called in to  [facility] [address]  Reason for Disposition . Back pain  Answer Assessment - Initial Assessment Questions 1. ONSET: "When did the pain begin?"      yesterday 2. LOCATION: "Where does it hurt?" (upper, mid or lower back)     Right side below the shoulder blade (right shoulder 3. SEVERITY: "How bad is the pain?"  (e.g., Scale 1-10; mild, moderate, or severe)   - MILD (1-3): doesn't interfere with normal activities    - MODERATE (4-7): interferes with normal activities or awakens from sleep    - SEVERE (8-10): excruciating pain, unable to do any normal activities   pt states it feels sore but is not in pain. Mild 4. PATTERN: "Is the pain constant?" (e.g., yes, no; constant, intermittent)  constant 5. RADIATION: "Does the pain shoot into your legs or elsewhere?" no 6. CAUSE:  "What do you think is causing the back pain?"  Slept wrong 7. BACK OVERUSE:  "Any recent lifting of heavy objects, strenuous work or exercise?" no 8. MEDICATIONS: "What have you taken so far for the pain?" (e.g., nothing, acetaminophen, NSAIDS) Generic tylenol 500mg  9. NEUROLOGIC SYMPTOMS: "Do you have any weakness, numbness, or problems with bowel/bladder control?" no 10. OTHER SYMPTOMS: "Do you have any other symptoms?" (e.g., fever, abdominal pain, burning with urination, blood in urine) no 11. PREGNANCY: "Is there any chance you are pregnant?" (e.g., yes, no; LMP) n/a  Protocols used: BACK PAIN-A-AH

## 2017-09-16 NOTE — Telephone Encounter (Signed)
The acetaminophen is fine  I cannot recommend other tx because we don't know what is causing it  I recommend use of heat and some stretching as tolerated  Please make an appt with first available if this does not improve on its own  Watch for rash or abdominal pain as well

## 2017-09-17 ENCOUNTER — Telehealth: Payer: Self-pay | Admitting: Family Medicine

## 2017-09-17 NOTE — Telephone Encounter (Signed)
Note from Dr. Milinda Antisower read to patient; verbalizes understanding.

## 2017-09-17 NOTE — Telephone Encounter (Signed)
Left VM requesting pt to call the office back CRM created 

## 2017-09-23 ENCOUNTER — Ambulatory Visit (INDEPENDENT_AMBULATORY_CARE_PROVIDER_SITE_OTHER): Payer: Medicare Other | Admitting: Family Medicine

## 2017-09-23 ENCOUNTER — Encounter: Payer: Self-pay | Admitting: Family Medicine

## 2017-09-23 VITALS — BP 128/74 | HR 83 | Temp 97.3°F | Ht 64.0 in | Wt 183.5 lb

## 2017-09-23 DIAGNOSIS — M546 Pain in thoracic spine: Secondary | ICD-10-CM | POA: Insufficient documentation

## 2017-09-23 DIAGNOSIS — B0229 Other postherpetic nervous system involvement: Secondary | ICD-10-CM | POA: Insufficient documentation

## 2017-09-23 DIAGNOSIS — I482 Chronic atrial fibrillation, unspecified: Secondary | ICD-10-CM

## 2017-09-23 DIAGNOSIS — R21 Rash and other nonspecific skin eruption: Secondary | ICD-10-CM

## 2017-09-23 MED ORDER — METHOCARBAMOL 500 MG PO TABS: 500.0000 mg | ORAL_TABLET | Freq: Four times a day (QID) | ORAL | 1 refills | Status: DC | PRN

## 2017-09-23 MED ORDER — VALACYCLOVIR HCL 1 G PO TABS: 1000.0000 mg | ORAL_TABLET | Freq: Three times a day (TID) | ORAL | 0 refills | Status: DC

## 2017-09-23 NOTE — Progress Notes (Signed)
Subjective:    Patient ID: Linda Johnson, female    DOB: 1935/12/31, 82 y.o.   MRN: [id number]  HPI Here for back pain since Tuesday  No trauma  Woke up with pain  Upper back - near shoulder blade area  Hurts to move/ roll over in bed  No neck pain  Moving neck or arms does not hurt her back   Pain is dull - and mild to moderate  Cannot do house work  She took tylenol (ES) every 8 hours  Also some heat - and that has not helped a lot -- heating pad irritated the skin   No n/t or weakness    Wt Readings from Last 3 Encounters:  09/23/17 183 lb 8 oz (83.2 kg)  03/25/17 179 lb (81.2 kg)  02/02/17 177 lb (80.3 kg)   31.50 kg/m   Patient Active Problem List   Diagnosis Date Noted  . Rash of back 09/23/2017  . Thoracic back pain 09/23/2017  . Cerumen impaction 09/19/2016  . Encounter for therapeutic drug monitoring 09/24/2015  . Encounter for anticoagulation discussion and counseling 09/20/2015  . Irregular heart rate 09/17/2015  . Atrial fibrillation (HCC) 09/17/2015  . Hearing loss 09/17/2015  . Hypothyroidism 12/12/2014  . Hyperglycemia 09/11/2014  . Encounter for Medicare annual wellness exam 09/11/2014  . Estrogen deficiency 09/11/2014  . Pyogenic granuloma 06/06/2014  . Routine general medical examination at a health care facility 09/09/2013  . Low back pain 03/21/2013  . Other screening mammogram 04/19/2012  . Post-menopausal 04/19/2012  . Obesity 08/18/2011  . Hyperlipidemia 06/12/2010  . Osteoporosis 06/12/2010  . ALLERGIC RHINITIS CAUSE UNSPECIFIED 04/12/2008  . HX, PERSONAL, COLONIC POLYPS 04/02/2007  . STRABISMUS 03/02/2007  . Essential hypertension 03/02/2007   Past Medical History:  Diagnosis Date  . Hypertension    Past Surgical History:  Procedure Laterality Date  . BREAST BIOPSY Left 2011   2 areas, cysts per pt   Social History   Tobacco Use  . Smoking status: Never Smoker  . Smokeless tobacco: Never Used  Substance Use Topics  .  Alcohol use: No    Alcohol/week: 0.0 oz  . Drug use: No   Family History  Problem Relation Age of Onset  . Leukemia Sister   . Breast cancer Sister   . Breast cancer Maternal Aunt 50   Allergies  Allergen Reactions  . Ace Inhibitors     REACTION: cough  . Hydrocodone-Homatropine Nausea Only  . Tramadol Hcl     nausea   Current Outpatient Medications on File Prior to Visit  Medication Sig Dispense Refill  . calcium-vitamin D (OSCAL WITH D) 500-200 MG-UNIT per tablet Take 1 tablet by mouth 2 (two) times daily.    . Cholecalciferol (VITAMIN D3) 2000 UNITS TABS Take 1 tablet by mouth 2 (two) times daily.     Marland Kitchen diltiazem (CARDIZEM CD) 120 MG 24 hr capsule Take 1 capsule (120 mg total) by mouth daily. 30 capsule 3  . diltiazem (CARDIZEM CD) 240 MG 24 hr capsule TAKE 1 CAPSULE (240 MG TOTAL) BY MOUTH DAILY. 30 capsule 3  . doxazosin (CARDURA) 8 MG tablet TAKE 1 TABLET (8 MG TOTAL) BY MOUTH 2 (TWO) TIMES DAILY. 60 tablet 3  . latanoprost (XALATAN) 0.005 % ophthalmic solution INSTILL ONE DROP IN BOTH EYES AT BEDTIME.  3  . losartan (COZAAR) 100 MG tablet Take 1 tablet (100 mg total) by mouth daily. 90 tablet 3  . warfarin (COUMADIN) 2.5 MG tablet  TAKE AS DIRECTED BY ANTI-COAGULATION CLINIC. 90 tablet 1   No current facility-administered medications on file prior to visit.      Review of Systems  Constitutional: Negative for activity change, appetite change, fatigue, fever and unexpected weight change.  HENT: Negative for congestion, ear pain, rhinorrhea, sinus pressure and sore throat.   Eyes: Negative for pain, redness and visual disturbance.  Respiratory: Negative for cough, shortness of breath and wheezing.   Cardiovascular: Negative for chest pain and palpitations.  Gastrointestinal: Negative for abdominal pain, blood in stool, constipation and diarrhea.  Endocrine: Negative for polydipsia and polyuria.  Genitourinary: Negative for dysuria, frequency and urgency.  Musculoskeletal:  Positive for back pain. Negative for arthralgias and myalgias.  Skin: Positive for rash. Negative for pallor and wound.  Allergic/Immunologic: Negative for environmental allergies.  Neurological: Negative for dizziness, syncope and headaches.  Hematological: Negative for adenopathy. Does not bruise/bleed easily.  Psychiatric/Behavioral: Negative for decreased concentration and dysphoric mood. The patient is not nervous/anxious.        Objective:   Physical Exam  Constitutional: She appears well-developed and well-nourished. No distress.  obese and well appearing   HENT:  Head: Normocephalic and atraumatic.  Mouth/Throat: Oropharynx is clear and moist.  Eyes: Pupils are equal, round, and reactive to light. Conjunctivae and EOM are normal. Right eye exhibits no discharge. Left eye exhibits no discharge. No scleral icterus.  Neck: Normal range of motion. Neck supple.  Cardiovascular: Normal rate and normal heart sounds.  Pulmonary/Chest: Effort normal and breath sounds normal. No respiratory distress. She has no wheezes. She has no rales.  Abdominal: Soft. Bowel sounds are normal.  Musculoskeletal: She exhibits tenderness. She exhibits no edema.  Tender over R thoracic musculature (med to scapula) -and in area of rash just below Worse with twisting Nl rom of spine and UEs  No spinous process tenderness  Lymphadenopathy:    She has no cervical adenopathy.  Neurological: She is alert. She has normal reflexes. She displays no atrophy and no tremor. No cranial nerve deficit or sensory deficit. She exhibits normal muscle tone. Coordination normal.  Skin: Skin is warm and dry. Rash noted. No pallor.  Rash in R T4-5 dermatome  Vesicles-some debrided and mildly tender  No signs of bacterial infection   Psychiatric: She has a normal mood and affect.          Assessment & Plan:   Problem List Items Addressed This Visit      Cardiovascular and Mediastinum   Atrial fibrillation (HCC)     Continues warfarin and rate control  No problems         Musculoskeletal and Integument   Rash of back    This looks like zoster rash and correlates to her pain (slightly lower however)  Pt thought it was from heating pad burn (poss but doubt) Keep clean and dry Valtrex 1 g tid 7d Handout on shingles  Pain control         Other   Thoracic back pain - Primary    R sided with rash just below/one sided/consistent with shingles  Unsure how long rash has been there (pt thought it was heating pad burn)  Disc tx of this (valtrex) Muscle relaxer at pt req as well  Will contact if she needs more pain control Handout given   Meds ordered this encounter  Medications  . valACYclovir (VALTREX) 1000 MG tablet    Sig: Take 1 tablet (1,000 mg total) by mouth 3 (three) times  daily.    Dispense:  21 tablet    Refill:  0  . methocarbamol (ROBAXIN) 500 MG tablet    Sig: Take 1 tablet (500 mg total) by mouth 4 (four) times daily as needed for muscle spasms. For back pain, caution of sedation    Dispense:  60 tablet    Refill:  1         Relevant Medications   methocarbamol (ROBAXIN) 500 MG tablet

## 2017-09-23 NOTE — Patient Instructions (Signed)
Your back pain may be due to muscle spasm but I also cannot rule out shingles (given the rash)  Take the valtrex for 7 days (to get shingles better faster)   No more heating pad!  Try the muscle relaxer (methocarbamol) up to four times daily with caution of sleepiness   Keep us posted

## 2017-09-23 NOTE — Telephone Encounter (Signed)
Addressed today when pt came in for appt

## 2017-09-24 NOTE — Assessment & Plan Note (Signed)
Continues warfarin and rate control  No problems

## 2017-09-24 NOTE — Assessment & Plan Note (Signed)
R sided with rash just below/one sided/consistent with shingles  Unsure how long rash has been there (pt thought it was heating pad burn)  Disc tx of this (valtrex) Muscle relaxer at pt req as well  Will contact if she needs more pain control Handout given   Meds ordered this encounter  Medications  . valACYclovir (VALTREX) 1000 MG tablet    Sig: Take 1 tablet (1,000 mg total) by mouth 3 (three) times daily.    Dispense:  21 tablet    Refill:  0  . methocarbamol (ROBAXIN) 500 MG tablet    Sig: Take 1 tablet (500 mg total) by mouth 4 (four) times daily as needed for muscle spasms. For back pain, caution of sedation    Dispense:  60 tablet    Refill:  1

## 2017-09-24 NOTE — Assessment & Plan Note (Signed)
This looks like zoster rash and correlates to her pain (slightly lower however)  Pt thought it was from heating pad burn (poss but doubt) Keep clean and dry Valtrex 1 g tid 7d Handout on shingles  Pain control

## 2017-09-29 ENCOUNTER — Ambulatory Visit: Payer: Medicare Other

## 2017-10-01 ENCOUNTER — Telehealth: Payer: Self-pay | Admitting: Family Medicine

## 2017-10-01 DIAGNOSIS — E039 Hypothyroidism, unspecified: Secondary | ICD-10-CM

## 2017-10-01 DIAGNOSIS — E782 Mixed hyperlipidemia: Secondary | ICD-10-CM

## 2017-10-01 DIAGNOSIS — R739 Hyperglycemia, unspecified: Secondary | ICD-10-CM

## 2017-10-01 DIAGNOSIS — I10 Essential (primary) hypertension: Secondary | ICD-10-CM

## 2017-10-01 DIAGNOSIS — M810 Age-related osteoporosis without current pathological fracture: Secondary | ICD-10-CM

## 2017-10-01 NOTE — Telephone Encounter (Signed)
-----   Message from Robert Bellowarlesia R Pinson, LPN sent at 4/0/98114/09/2017  4:17 PM EDT ----- Regarding: Labs 4/5 Lab orders needed. Thank you.  Insurance:  Medicare - traditional

## 2017-10-02 ENCOUNTER — Ambulatory Visit (INDEPENDENT_AMBULATORY_CARE_PROVIDER_SITE_OTHER): Payer: Medicare Other

## 2017-10-02 VITALS — BP 124/72 | HR 79 | Temp 97.5°F | Ht 64.0 in | Wt 177.8 lb

## 2017-10-02 DIAGNOSIS — I10 Essential (primary) hypertension: Secondary | ICD-10-CM

## 2017-10-02 DIAGNOSIS — M810 Age-related osteoporosis without current pathological fracture: Secondary | ICD-10-CM | POA: Diagnosis not present

## 2017-10-02 DIAGNOSIS — E039 Hypothyroidism, unspecified: Secondary | ICD-10-CM

## 2017-10-02 DIAGNOSIS — R739 Hyperglycemia, unspecified: Secondary | ICD-10-CM

## 2017-10-02 DIAGNOSIS — E782 Mixed hyperlipidemia: Secondary | ICD-10-CM | POA: Diagnosis not present

## 2017-10-02 DIAGNOSIS — Z0000 Encounter for general adult medical examination without abnormal findings: Secondary | ICD-10-CM

## 2017-10-02 LAB — COMPREHENSIVE METABOLIC PANEL
ALT: 12 U/L (ref 0–35)
AST: 12 U/L (ref 0–37)
Albumin: 4 g/dL (ref 3.5–5.2)
Alkaline Phosphatase: 70 U/L (ref 39–117)
BUN: 15 mg/dL (ref 6–23)
CALCIUM: 9.6 mg/dL (ref 8.4–10.5)
CHLORIDE: 101 meq/L (ref 96–112)
CO2: 30 meq/L (ref 19–32)
CREATININE: 0.98 mg/dL (ref 0.40–1.20)
GFR: 58.11 mL/min — ABNORMAL LOW (ref >60.00)
GLUCOSE: 151 mg/dL — AB (ref 70–99)
Potassium: 4.3 mEq/L (ref 3.5–5.1)
SODIUM: 139 meq/L (ref 135–145)
Total Bilirubin: 0.6 mg/dL (ref 0.2–1.2)
Total Protein: 7.4 g/dL (ref 6.0–8.3)

## 2017-10-02 LAB — CBC WITH DIFFERENTIAL/PLATELET
BASOS PCT: 0.7 % (ref 0.0–3.0)
Basophils Absolute: 0.1 10*3/uL (ref 0.0–0.1)
EOS ABS: 0 10*3/uL (ref 0.0–0.7)
Eosinophils Relative: 0.4 % (ref 0.0–5.0)
HEMATOCRIT: 44.3 % (ref 36.0–46.0)
Hemoglobin: 14.9 g/dL (ref 12.0–15.0)
LYMPHS ABS: 0.8 10*3/uL (ref 0.7–4.0)
LYMPHS PCT: 11.5 % — AB (ref 12.0–46.0)
MCHC: 33.6 g/dL (ref 30.0–36.0)
MCV: 91.6 fl (ref 78.0–100.0)
Monocytes Absolute: 0.3 10*3/uL (ref 0.1–1.0)
Monocytes Relative: 4.1 % (ref 3.0–12.0)
NEUTROS ABS: 6.1 10*3/uL (ref 1.4–7.7)
NEUTROS PCT: 83.3 % — AB (ref 43.0–77.0)
PLATELETS: 239 10*3/uL (ref 150.0–400.0)
RBC: 4.84 Mil/uL (ref 3.87–5.11)
RDW: 14.2 % (ref 11.5–15.5)
WBC: 7.4 10*3/uL (ref 4.0–10.5)

## 2017-10-02 LAB — LIPID PANEL
CHOL/HDL RATIO: 2
Cholesterol: 126 mg/dL (ref 0–200)
HDL: 50.6 mg/dL (ref >39.00)
LDL Cholesterol: 56 mg/dL (ref 0–99)
NONHDL: 75.75
Triglycerides: 101 mg/dL (ref 0.0–149.0)
VLDL: 20.2 mg/dL (ref 0.0–40.0)

## 2017-10-02 LAB — HEMOGLOBIN A1C: HEMOGLOBIN A1C: 5.8 % (ref 4.6–6.5)

## 2017-10-02 LAB — TSH: TSH: 4.32 u[IU]/mL (ref 0.35–4.50)

## 2017-10-02 LAB — VITAMIN D 25 HYDROXY (VIT D DEFICIENCY, FRACTURES): VITD: 48.08 ng/mL (ref 30.00–100.00)

## 2017-10-02 NOTE — Patient Instructions (Signed)
Linda Johnson , Thank you for taking time to come for your Medicare Wellness Visit. I appreciate your ongoing commitment to your health goals. Please review the following plan we discussed and let me know if I can assist you in the future.   These are the goals we discussed: Goals    . Eat more fruits and vegetables     Starting 10/02/2017, I continue to eat at least 5 svgs of fresh fruits and vegetables daily.        This is a list of the screening recommended for you and due dates:  Health Maintenance  Topic Date Due  . Tetanus Vaccine  04/22/2026*  . Flu Shot  01/28/2018  . Mammogram  08/07/2018  . DEXA scan (bone density measurement)  Completed  . Pneumonia vaccines  Completed  *Topic was postponed. The date shown is not the original due date.   Preventive Care for Adults  A healthy lifestyle and preventive care can promote health and wellness. Preventive health guidelines for adults include the following key practices.  . A routine yearly physical is a good way to check with your health care provider about your health and preventive screening. It is a chance to share any concerns and updates on your health and to receive a thorough exam.  . Visit your dentist for a routine exam and preventive care every 6 months. Brush your teeth twice a day and floss once a day. Good oral hygiene prevents tooth decay and gum disease.  . The frequency of eye exams is based on your age, health, family medical history, use  of contact lenses, and other factors. Follow your health care provider's recommendations for frequency of eye exams.  . Eat a healthy diet. Foods like vegetables, fruits, whole grains, low-fat dairy products, and lean protein foods contain the nutrients you need without too many calories. Decrease your intake of foods high in solid fats, added sugars, and salt. Eat the right amount of calories for you. Get information about a proper diet from your health care provider, if  necessary.  . Regular physical exercise is one of the most important things you can do for your health. Most adults should get at least 150 minutes of moderate-intensity exercise (any activity that increases your heart rate and causes you to sweat) each week. In addition, most adults need muscle-strengthening exercises on 2 or more days a week.  Silver Sneakers may be a benefit available to you. To determine eligibility, you may visit the website: www.silversneakers.com or contact program at ([phone])[phone] Mon-Fri between 8AM-8PM.   . Maintain a healthy weight. The body mass index (BMI) is a screening tool to identify possible weight problems. It provides an estimate of body fat based on height and weight. Your health care provider can find your BMI and can help you achieve or maintain a healthy weight.   For adults 20 years and older: ? A BMI below 18.5 is considered underweight. ? A BMI of 18.5 to 24.9 is normal. ? A BMI of 25 to 29.9 is considered overweight. ? A BMI of 30 and above is considered obese.   . Maintain normal blood lipids and cholesterol levels by exercising and minimizing your intake of saturated fat. Eat a balanced diet with plenty of fruit and vegetables. Blood tests for lipids and cholesterol should begin at age 80 and be repeated every 5 years. If your lipid or cholesterol levels are high, you are over 50, or you are at high risk  for heart disease, you may need your cholesterol levels checked more frequently. Ongoing high lipid and cholesterol levels should be treated with medicines if diet and exercise are not working.  . If you smoke, find out from your health care provider how to quit. If you do not use tobacco, please do not start.  . If you choose to drink alcohol, please do not consume more than 2 drinks per day. One drink is considered to be 12 ounces (355 mL) of beer, 5 ounces (148 mL) of wine, or 1.5 ounces (44 mL) of liquor.  . If you are 60-18 years old, ask your  health care provider if you should take aspirin to prevent strokes.  . Use sunscreen. Apply sunscreen liberally and repeatedly throughout the day. You should seek shade when your shadow is shorter than you. Protect yourself by wearing long sleeves, pants, a wide-brimmed hat, and sunglasses year round, whenever you are outdoors.  . Once a month, do a whole body skin exam, using a mirror to look at the skin on your back. Tell your health care provider of new moles, moles that have irregular borders, moles that are larger than a pencil eraser, or moles that have changed in shape or color.

## 2017-10-04 NOTE — Progress Notes (Signed)
Subjective:   Linda Johnson is a 82 y.o. female who presents for Medicare Annual (Subsequent) preventive examination.  Review of Systems:  N/A Cardiac Risk Factors include: advanced age (>15men, >48 women);obesity (BMI >30kg/m2);hypertension;dyslipidemia     Objective:     Vitals: BP 124/72 (BP Location: Right Arm, Patient Position: Sitting, Cuff Size: Normal)   Pulse 79   Temp (!) 97.5 F (36.4 C) (Oral)   Ht 5\' 4"  (1.626 m) Comment: Shoes  Wt 177 lb 12 oz (80.6 kg)   SpO2 92%   BMI 30.51 kg/m   Body mass index is 30.51 kg/m.  Advanced Directives 10/02/2017 09/23/2016 09/13/2015  Does Patient Have a Medical Advance Directive? Yes Yes Yes  Type of Estate agent of [place];Living will Healthcare Power of [place];Living will Healthcare Power of [place];Living will  Does patient want to make changes to medical advance directive? - - No - Patient declined  Copy of Healthcare Power of Attorney in Chart? Yes No - copy requested No - copy requested    Tobacco Social History   Tobacco Use  Smoking Status Never Smoker  Smokeless Tobacco Never Used     Counseling given: No   Clinical Intake:  Pre-visit preparation completed: Yes  Pain : 0-10 Pain Score: 7  Pain Type: Acute pain Pain Location: Flank Pain Onset: 1 to 4 weeks ago Pain Frequency: Constant     Nutritional Status: BMI > 30  Obese Nutritional Risks: None Diabetes: No  How often do you need to have someone help you when you read instructions, pamphlets, or other written materials from your doctor or pharmacy?: 1 - Never What is the last grade level you completed in school?: 12th grade  Interpreter Needed?: No  Comments: pt is divorced and lives alone Information entered by :: LPinson, LPN  Past Medical History:  Diagnosis Date  . Hypertension    Past Surgical History:  Procedure Laterality Date  . BREAST BIOPSY Left 2011   2 areas, cysts per pt   Family History  Problem  Relation Age of Onset  . Leukemia Sister   . Breast cancer Sister   . Breast cancer Maternal Aunt 41   Social History   Socioeconomic History  . Marital status: Divorced    Spouse name: Not on file  . Number of children: Not on file  . Years of education: Not on file  . Highest education level: Not on file  Occupational History  . Not on file  Social Needs  . Financial resource strain: Not on file  . Food insecurity:    Worry: Not on file    Inability: Not on file  . Transportation needs:    Medical: Not on file    Non-medical: Not on file  Tobacco Use  . Smoking status: Never Smoker  . Smokeless tobacco: Never Used  Substance and Sexual Activity  . Alcohol use: No    Alcohol/week: 0.0 oz  . Drug use: No  . Sexual activity: Never  Lifestyle  . Physical activity:    Days per week: Not on file    Minutes per session: Not on file  . Stress: Not on file  Relationships  . Social connections:    Talks on phone: Not on file    Gets together: Not on file    Attends religious service: Not on file    Active member of club or organization: Not on file    Attends meetings of clubs or organizations: Not  on file    Relationship status: Not on file  Other Topics Concern  . Not on file  Social History Narrative  . Not on file    Outpatient Encounter Medications as of 10/02/2017  Medication Sig  . calcium-vitamin D (OSCAL WITH D) 500-200 MG-UNIT per tablet Take 1 tablet by mouth 2 (two) times daily.  . Cholecalciferol (VITAMIN D3) 2000 UNITS TABS Take 1 tablet by mouth 2 (two) times daily.   Marland Kitchen. diltiazem (CARDIZEM CD) 120 MG 24 hr capsule Take 1 capsule (120 mg total) by mouth daily.  Marland Kitchen. diltiazem (CARDIZEM CD) 240 MG 24 hr capsule TAKE 1 CAPSULE (240 MG TOTAL) BY MOUTH DAILY.  Marland Kitchen. doxazosin (CARDURA) 8 MG tablet TAKE 1 TABLET (8 MG TOTAL) BY MOUTH 2 (TWO) TIMES DAILY.  Marland Kitchen. latanoprost (XALATAN) 0.005 % ophthalmic solution INSTILL ONE DROP IN BOTH EYES AT BEDTIME.  Marland Kitchen. losartan  (COZAAR) 100 MG tablet Take 1 tablet (100 mg total) by mouth daily.  . methocarbamol (ROBAXIN) 500 MG tablet Take 1 tablet (500 mg total) by mouth 4 (four) times daily as needed for muscle spasms. For back pain, caution of sedation  . valACYclovir (VALTREX) 1000 MG tablet Take 1 tablet (1,000 mg total) by mouth 3 (three) times daily.  Marland Kitchen. warfarin (COUMADIN) 2.5 MG tablet TAKE AS DIRECTED BY ANTI-COAGULATION CLINIC.   No facility-administered encounter medications on file as of 10/02/2017.     Activities of Daily Living In your present state of health, do you have any difficulty performing the following activities: 10/02/2017  Hearing? Y  Vision? N  Difficulty concentrating or making decisions? N  Walking or climbing stairs? Y  Dressing or bathing? N  Doing errands, shopping? N  Preparing Food and eating ? N  Using the Toilet? N  In the past six months, have you accidently leaked urine? N  Do you have problems with loss of bowel control? N  Managing your Medications? N  Managing your Finances? N  Housekeeping or managing your Housekeeping? N  Some recent data might be hidden    Patient Care Team: Tower, Audrie GallusMarne A, MD as PCP - General Mariah MillingGollan, Tollie Pizzaimothy J, MD as Consulting Physician (Cardiology)    Assessment:   This is a routine wellness examination for Linda Johnson.   Hearing Screening   125Hz  250Hz  500Hz  1000Hz  2000Hz  3000Hz  4000Hz  6000Hz  8000Hz   Right ear:   0 0 0  0    Left ear:   0 0 0  40    Vision Screening Comments: Last vision exam in June 2018    Exercise Activities and Dietary recommendations Current Exercise Habits: The patient does not participate in regular exercise at present, Exercise limited by: orthopedic condition(s)  Goals    . Eat more fruits and vegetables     Starting 10/02/2017, I continue to eat at least 5 svgs of fresh fruits and vegetables daily.        Fall Risk Fall Risk  10/02/2017 09/23/2016 09/13/2015 09/11/2014 09/09/2013  Falls in the past year? No No No  No No  Number falls in past yr: - - - - -  Risk for fall due to : - - History of fall(s) - -  Risk for fall due to: Comment - - fall approx 4 yrs ago with injury - -    Depression Screen PHQ 2/9 Scores 10/02/2017 09/23/2016 09/13/2015 09/11/2014  PHQ - 2 Score 0 0 0 0  PHQ- 9 Score 0 - - -  Cognitive Function MMSE - Mini Mental State Exam 10/02/2017 09/23/2016 09/13/2015  Orientation to time 5 5 5   Orientation to Place 5 5 5   Registration 3 3 3   Attention/ Calculation 0 0 5  Recall 3 3 3   Language- name 2 objects 0 0 0  Language- repeat 1 1 1   Language- follow 3 step command 2 3 3   Language- follow 3 step command-comments unable to follow 1 step of 3 step command - -  Language- read & follow direction 0 0 1  Write a sentence 0 0 0  Copy design 0 0 0  Total score 19 20 26      PLEASE NOTE: A Mini-Cog screen was completed. Maximum score is 20. A value of 0 denotes this part of [place] MMSE was not completed or the patient failed this part of the Mini-Cog screening.   Mini-Cog Screening Orientation to Time - Max 5 pts Orientation to Place - Max 5 pts Registration - Max 3 pts Recall - Max 3 pts Language Repeat - Max 1 pts Language Follow 3 Step Command - Max 3 pts     Immunization History  Administered Date(s) Administered  . Influenza Split 04/01/2011, 03/17/2012  . Influenza Whole 04/23/2006, 04/02/2007, 03/27/2008, 04/16/2009, 04/23/2010  . Influenza,inj,Quad PF,6+ Mos 03/24/2013, 04/10/2014, 04/13/2015, 03/19/2016, 03/25/2017  . Pneumococcal Conjugate-13 09/11/2014  . Pneumococcal Polysaccharide-23 04/16/2009  . Td 04/23/2006    Screening Tests Health Maintenance  Topic Date Due  . TETANUS/TDAP  04/22/2026 (Originally 04/23/2016)  . INFLUENZA VACCINE  01/28/2018  . MAMMOGRAM  08/07/2018  . DEXA SCAN  Completed  . PNA vac Low Risk Adult  Completed      Plan:     I have personally reviewed, addressed, and noted the following in the patient's chart:  A. Medical  and social history B. Use of alcohol, tobacco or illicit drugs  C. Current medications and supplements D. Functional ability and status E.  Nutritional status F.  Physical activity G. Advance directives H. List of other physicians I.  Hospitalizations, surgeries, and ER visits in previous 12 months J.  Vitals K. Screenings to include hearing, vision, cognitive, depression L. Referrals and appointments - none  In addition, I have reviewed and discussed with patient certain preventive protocols, quality metrics, and best practice recommendations. A written personalized care plan for preventive services as well as general preventive health recommendations were provided to patient.  See attached scanned questionnaire for additional information.   Signed,   Randa Evens, MHA, BS, LPN Health Coach

## 2017-10-04 NOTE — Progress Notes (Signed)
PCP notes:   Health maintenance:  No gaps identified.   Abnormal screenings:   Hearing - failed  Hearing Screening   125Hz  250Hz  500Hz  1000Hz  2000Hz  3000Hz  4000Hz  6000Hz  8000Hz   Right ear:   0 0 0  0    Left ear:   0 0 0  40     Mini-Cog score: 19/20 MMSE - Mini Mental State Exam 10/02/2017 09/23/2016 09/13/2015  Orientation to time 5 5 5   Orientation to Place 5 5 5   Registration 3 3 3   Attention/ Calculation 0 0 5  Recall 3 3 3   Language- name 2 objects 0 0 0  Language- repeat 1 1 1   Language- follow 3 step command 2 3 3   Language- follow 3 step command-comments unable to follow 1 step of 3 step command - -  Language- read & follow direction 0 0 1  Write a sentence 0 0 0  Copy design 0 0 0  Total score 19 20 26     Patient concerns:   None  Nurse concerns:  None  Next PCP appt:   10/05/17 @ 1130  I reviewed health advisor's note, was available for consultation, and agree with documentation and plan. Roxy MannsMarne Tower MD

## 2017-10-05 ENCOUNTER — Ambulatory Visit (INDEPENDENT_AMBULATORY_CARE_PROVIDER_SITE_OTHER): Payer: Medicare Other | Admitting: Family Medicine

## 2017-10-05 ENCOUNTER — Encounter: Payer: Self-pay | Admitting: Family Medicine

## 2017-10-05 VITALS — BP 126/74 | HR 97 | Temp 97.4°F | Ht 64.0 in | Wt 176.5 lb

## 2017-10-05 DIAGNOSIS — R2689 Other abnormalities of gait and mobility: Secondary | ICD-10-CM

## 2017-10-05 DIAGNOSIS — M810 Age-related osteoporosis without current pathological fracture: Secondary | ICD-10-CM

## 2017-10-05 DIAGNOSIS — I482 Chronic atrial fibrillation, unspecified: Secondary | ICD-10-CM

## 2017-10-05 DIAGNOSIS — B0229 Other postherpetic nervous system involvement: Secondary | ICD-10-CM | POA: Diagnosis not present

## 2017-10-05 DIAGNOSIS — E782 Mixed hyperlipidemia: Secondary | ICD-10-CM | POA: Diagnosis not present

## 2017-10-05 DIAGNOSIS — I10 Essential (primary) hypertension: Secondary | ICD-10-CM | POA: Diagnosis not present

## 2017-10-05 DIAGNOSIS — E6609 Other obesity due to excess calories: Secondary | ICD-10-CM | POA: Diagnosis not present

## 2017-10-05 DIAGNOSIS — Z86010 Personal history of colonic polyps: Secondary | ICD-10-CM

## 2017-10-05 DIAGNOSIS — E039 Hypothyroidism, unspecified: Secondary | ICD-10-CM | POA: Diagnosis not present

## 2017-10-05 DIAGNOSIS — R739 Hyperglycemia, unspecified: Secondary | ICD-10-CM | POA: Diagnosis not present

## 2017-10-05 DIAGNOSIS — Z6830 Body mass index (BMI) 30.0-30.9, adult: Secondary | ICD-10-CM | POA: Diagnosis not present

## 2017-10-05 DIAGNOSIS — H9193 Unspecified hearing loss, bilateral: Secondary | ICD-10-CM | POA: Diagnosis not present

## 2017-10-05 MED ORDER — GABAPENTIN 100 MG PO CAPS: 100.0000 mg | ORAL_CAPSULE | Freq: Three times a day (TID) | ORAL | 3 refills | Status: DC

## 2017-10-05 MED ORDER — LOSARTAN POTASSIUM 100 MG PO TABS: 100.0000 mg | ORAL_TABLET | Freq: Every day | ORAL | 3 refills | Status: DC

## 2017-10-05 NOTE — Assessment & Plan Note (Signed)
dexa 4/18 rev S/p 5 y tx with fosamax No falls or fx  Disc need for calcium/ vitamin D/ wt bearing exercise and bone density test every 2 y to monitor Disc safety/ fracture risk in detail   Continue to watch  D level is therapeutic

## 2017-10-05 NOTE — Assessment & Plan Note (Signed)
Continues warfarin/ also rate control No clinical changes

## 2017-10-05 NOTE — Assessment & Plan Note (Signed)
Lab Results  Component Value Date   HGBA1C 5.8 10/02/2017   Stable disc imp of low glycemic diet and wt loss to prevent DM2

## 2017-10-05 NOTE — Assessment & Plan Note (Signed)
Pt chooses no more colonoscopies  Also declines any colon cancer screening at all due to age

## 2017-10-05 NOTE — Assessment & Plan Note (Signed)
Currently using walker and has not had any falls  She is careful Disc fall prev  Also offered ref to PT for balance gait training when ready

## 2017-10-05 NOTE — Assessment & Plan Note (Signed)
Hypothyroidism  Pt has no clinical changes No change in energy level/ hair or skin/ edema and no tremor Lab Results  Component Value Date   TSH 4.32 10/02/2017

## 2017-10-05 NOTE — Assessment & Plan Note (Signed)
bp in fair control at this time  BP Readings from Last 1 Encounters:  10/05/17 126/74   No changes needed Disc lifstyle change with low sodium diet and exercise  Labs rev

## 2017-10-05 NOTE — Assessment & Plan Note (Signed)
S/p tx with valtrex  Rash continues to dry and improve but pain continues  Discussed condition and expectations Px gabapentin 100 mg bid-titrate to tid and then up as needed (disc side eff profile)  Handout given  When improved- enc her to get on wait list for shingrix

## 2017-10-05 NOTE — Assessment & Plan Note (Signed)
Discussed how this problem influences overall health and the risks it imposes  Reviewed plan for weight loss with lower calorie diet (via better food choices and also portion control or program like weight watchers) and exercise building up to or more than 30 minutes 5 days per week including some aerobic activity    

## 2017-10-05 NOTE — Progress Notes (Signed)
Subjective:    Patient ID: Linda CousinsLinda R Paluch, female    DOB: 02/23/1936, 82 y.o.   MRN: [id number]  HPI Here for annual f/u of chronic health problems     Wt Readings from Last 3 Encounters:  10/05/17 176 lb 8 oz (80.1 kg)  10/02/17 177 lb 12 oz (80.6 kg)  09/23/17 183 lb 8 oz (83.2 kg)  wt is down 7 lb from a year ago  With shingles- has not eaten as much and appetite is slowing down a bit also  Eating less in general  Before shingles- was trying to walk also  30.30 kg/m   Has fear of falling Using her walker (cane does not help as much)  Vision is ok- goes to [facility] and wears glasses   amw was 4/5 Failed hearing screen -cannot get hearing aides due to expense-medicare would not pay at all  Mini cog missed one point (missing 1 step of 3 step command) - thinks due to her poor hearing Denies hearing problems   Due for 10 y tetanus shot -cannot afford   Mammogram 2/19 Breast cancer in family Self breast exam -no lumps   Does not see a gyn   dexa 4/18-stable osteoporosis  Had a full course of fosamax No falls or fx  Takes ca and D3 D level is 48.8    Colonoscopy 5/14 with 5 y recall -she is not interested in that  Declines any colon cancer screen incl cologuard or ifob    Zoster status -treated for shingles late last mo  Still having pain - not improved at all (not any worse)  Muscle relaxer did not help at all  Keeping her up at night    bp is stable today  No cp or palpitations or headaches or edema  No side effects to medicines  BP Readings from Last 3 Encounters:  10/05/17 126/74  10/02/17 124/72  09/23/17 128/74     Hx of a fib  Lab Results  Component Value Date   INR 2.6 09/08/2017   INR 2.0 07/28/2017   INR 2.2 06/16/2017   anticoag with warfarin  Hypothyroidism  Pt has no clinical changes No change in energy level/ hair or skin/ edema and no tremor Lab Results  Component Value Date   TSH 4.32 10/02/2017      Hyperlipidemia Lab  Results  Component Value Date   CHOL 126 10/02/2017   CHOL 152 09/15/2016   CHOL 172 03/06/2016   Lab Results  Component Value Date   HDL 50.60 10/02/2017   HDL 50.00 09/15/2016   HDL 42.80 03/06/2016   Lab Results  Component Value Date   LDLCALC 56 10/02/2017   LDLCALC 87 09/15/2016   LDLCALC 95 03/06/2016   Lab Results  Component Value Date   TRIG 101.0 10/02/2017   TRIG 76.0 09/15/2016   TRIG 170.0 (H) 03/06/2016   Lab Results  Component Value Date   CHOLHDL 2 10/02/2017   CHOLHDL 3 09/15/2016   CHOLHDL 4 03/06/2016   Lab Results  Component Value Date   LDLDIRECT 132.4 05/06/2010   LDLDIRECT 107.9 04/02/2007   Hyperglycemia Lab Results  Component Value Date   HGBA1C 5.8 10/02/2017  last check 5.7 Likes sweets - eats more fruits  Some sugar free jello and yogurt  Drinks lots of water   Patient Active Problem List   Diagnosis Date Noted  . Poor balance 10/05/2017  . Post herpetic neuralgia 09/23/2017  . Thoracic back pain  09/23/2017  . Encounter for therapeutic drug monitoring 09/24/2015  . Encounter for anticoagulation discussion and counseling 09/20/2015  . Irregular heart rate 09/17/2015  . Atrial fibrillation (HCC) 09/17/2015  . Hearing loss 09/17/2015  . Hypothyroidism 12/12/2014  . Hyperglycemia 09/11/2014  . Encounter for Medicare annual wellness exam 09/11/2014  . Estrogen deficiency 09/11/2014  . Routine general medical examination at a health care facility 09/09/2013  . Low back pain 03/21/2013  . Other screening mammogram 04/19/2012  . Post-menopausal 04/19/2012  . Obesity 08/18/2011  . Hyperlipidemia 06/12/2010  . Osteoporosis 06/12/2010  . ALLERGIC RHINITIS CAUSE UNSPECIFIED 04/12/2008  . HX, PERSONAL, COLONIC POLYPS 04/02/2007  . STRABISMUS 03/02/2007  . Essential hypertension 03/02/2007   Past Medical History:  Diagnosis Date  . Hypertension    Past Surgical History:  Procedure Laterality Date  . BREAST BIOPSY Left 2011   2  areas, cysts per pt   Social History   Tobacco Use  . Smoking status: Never Smoker  . Smokeless tobacco: Never Used  Substance Use Topics  . Alcohol use: No    Alcohol/week: 0.0 oz  . Drug use: No   Family History  Problem Relation Age of Onset  . Leukemia Sister   . Breast cancer Sister   . Breast cancer Maternal Aunt 50   Allergies  Allergen Reactions  . Ace Inhibitors     REACTION: cough  . Hydrocodone-Homatropine Nausea Only  . Tramadol Hcl     nausea   Current Outpatient Medications on File Prior to Visit  Medication Sig Dispense Refill  . calcium-vitamin D (OSCAL WITH D) 500-200 MG-UNIT per tablet Take 1 tablet by mouth 2 (two) times daily.    . Cholecalciferol (VITAMIN D3) 2000 UNITS TABS Take 1 tablet by mouth 2 (two) times daily.     Marland Kitchen diltiazem (CARDIZEM CD) 120 MG 24 hr capsule Take 1 capsule (120 mg total) by mouth daily. 30 capsule 3  . diltiazem (CARDIZEM CD) 240 MG 24 hr capsule TAKE 1 CAPSULE (240 MG TOTAL) BY MOUTH DAILY. 30 capsule 3  . doxazosin (CARDURA) 8 MG tablet TAKE 1 TABLET (8 MG TOTAL) BY MOUTH 2 (TWO) TIMES DAILY. 60 tablet 3  . latanoprost (XALATAN) 0.005 % ophthalmic solution INSTILL ONE DROP IN BOTH EYES AT BEDTIME.  3  . warfarin (COUMADIN) 2.5 MG tablet TAKE AS DIRECTED BY ANTI-COAGULATION CLINIC. 90 tablet 1   No current facility-administered medications on file prior to visit.     Review of Systems  Constitutional: Positive for fatigue. Negative for activity change, appetite change, fever and unexpected weight change.  HENT: Negative for congestion, ear pain, rhinorrhea, sinus pressure and sore throat.   Eyes: Negative for pain, redness and visual disturbance.  Respiratory: Negative for cough, shortness of breath and wheezing.   Cardiovascular: Negative for chest pain and palpitations.  Gastrointestinal: Negative for abdominal pain, blood in stool, constipation and diarrhea.  Endocrine: Negative for polydipsia and polyuria.    Genitourinary: Negative for dysuria, frequency and urgency.  Musculoskeletal: Positive for back pain. Negative for arthralgias and myalgias.  Skin: Positive for rash. Negative for pallor.       Back pain from shingles    Allergic/Immunologic: Negative for environmental allergies.  Neurological: Negative for dizziness, syncope and headaches.       Poor balance with fear of falling   Hematological: Negative for adenopathy. Does not bruise/bleed easily.  Psychiatric/Behavioral: Negative for decreased concentration and dysphoric mood. The patient is nervous/anxious.  Objective:   Physical Exam  Constitutional: She appears well-developed and well-nourished. No distress.  obese and well appearing   HENT:  Head: Normocephalic and atraumatic.  Right Ear: External ear normal.  Left Ear: External ear normal.  Mouth/Throat: Oropharynx is clear and moist.  Baseline strabismus   HOH  Eyes: Pupils are equal, round, and reactive to light. Conjunctivae and EOM are normal. No scleral icterus.  Baseline stabismus  Neck: Normal range of motion. Neck supple. No JVD present. Carotid bruit is not present. No thyromegaly present.  Cardiovascular: Normal rate, normal heart sounds and intact distal pulses. Exam reveals no gallop.  irreg rhythm of a fib  Pulmonary/Chest: Effort normal and breath sounds normal. No respiratory distress. She has no wheezes. She exhibits no tenderness. No breast swelling, tenderness, discharge or bleeding.  Abdominal: Soft. Bowel sounds are normal. She exhibits no distension, no abdominal bruit and no mass. There is no tenderness.  Genitourinary: No breast swelling, tenderness, discharge or bleeding.  Musculoskeletal: Normal range of motion. She exhibits no edema or tenderness.  Lymphadenopathy:    She has no cervical adenopathy.  Neurological: She is alert. She has normal reflexes. No cranial nerve deficit. She exhibits normal muscle tone. Coordination normal.  Skin:  Skin is warm and dry. No rash noted. No erythema. No pallor.  Solar lentigines diffusely  Some SKs   Zoster vesicles on R back are drying up  No signs of bacterial infection  Psychiatric: She has a normal mood and affect.  Mildly anxious          Assessment & Plan:

## 2017-10-05 NOTE — Assessment & Plan Note (Signed)
Disc goals for lipids and reasons to control them Rev labs with pt Rev low sat fat diet in detail Labs reviewed

## 2017-10-05 NOTE — Patient Instructions (Addendum)
To prevent diabetes - try to reduce sugar in diet  Try to get most of your carbohydrates from produce (with the exception of white potatoes)  Eat less bread/pasta/rice/snack foods/cereals/sweets and other items from the middle of the grocery store (processed carbs)   When you are feeling better we should work on getting a shingles vaccine  If you are interested in the new shingles vaccine (Shingrix) - call your local pharmacy to check on coverage and availability  You can get on a wait list at your pharmacy   For shingles pain (post herpetic neuropathy) start gabapentin (caution of sedation)  Start with 100 mg 1 in the am and one in the pm  A week later go up to 100 mg am/ lunch and pm  Then let me know how you are doing and we can start increasing the dose   When you are feeling better we can refer you to physical therapy for balance   Here is a handout on post herpetic neuralgia

## 2017-10-05 NOTE — Assessment & Plan Note (Signed)
Needs hearing aides but unable to afford at this time Disc safety issues with hearing loss

## 2017-10-06 ENCOUNTER — Ambulatory Visit (INDEPENDENT_AMBULATORY_CARE_PROVIDER_SITE_OTHER): Payer: Medicare Other

## 2017-10-06 DIAGNOSIS — Z5181 Encounter for therapeutic drug level monitoring: Secondary | ICD-10-CM

## 2017-10-06 DIAGNOSIS — Z7901 Long term (current) use of anticoagulants: Secondary | ICD-10-CM | POA: Diagnosis not present

## 2017-10-06 LAB — POCT INR: INR: 6.1

## 2017-10-06 NOTE — Patient Instructions (Signed)
NR TODAY 6.1  *Patient states that she has had the shingles and has been taking robaxin and valtrex. Those meds typically don't interact with coumadin however she denies any other changes EXCEPT has not been eating due to shingles pain.  Patient states losing 8lbs in recent weeks.    Patient has taken her coumadin before coming in today.  She is to hold coumadin until recheck this Friday 10/09/17.    Patient educated on risks associated with a supratherapeutic level and will go to the ER if any concerns develop.  She denies any abnormal bruising or bleeding.

## 2017-10-09 ENCOUNTER — Ambulatory Visit (INDEPENDENT_AMBULATORY_CARE_PROVIDER_SITE_OTHER): Payer: Medicare Other

## 2017-10-09 DIAGNOSIS — Z5181 Encounter for therapeutic drug level monitoring: Secondary | ICD-10-CM

## 2017-10-09 DIAGNOSIS — Z7901 Long term (current) use of anticoagulants: Secondary | ICD-10-CM

## 2017-10-09 LAB — POCT INR: INR: 2.9

## 2017-10-09 NOTE — Patient Instructions (Signed)
NR TODAY 4/12 2.9  Resume taking decreased dosing today (4/12): 1/2 pill (1.25mg ) on Mon, Wed, Fri and 1 pill (2.5mg ) the rest of the days.  Recheck in 2 weeks.    Patient is doing well without any concerns today.

## 2017-10-22 ENCOUNTER — Ambulatory Visit (INDEPENDENT_AMBULATORY_CARE_PROVIDER_SITE_OTHER): Payer: Medicare Other | Admitting: General Practice

## 2017-10-22 DIAGNOSIS — I4891 Unspecified atrial fibrillation: Secondary | ICD-10-CM

## 2017-10-22 DIAGNOSIS — Z5181 Encounter for therapeutic drug level monitoring: Secondary | ICD-10-CM

## 2017-10-22 LAB — POCT INR: INR: 3.1

## 2017-10-22 NOTE — Patient Instructions (Addendum)
Pre visit review using our clinic review tool, if applicable. No additional management support is needed unless otherwise documented below in the visit note.  Continue to take decreased dosing today (4/25): 1/2 pill (1.25mg ) on Mon, Wed, Fri and 1 pill (2.5mg ) the rest of the days.  Recheck in 2 weeks.

## 2017-10-27 ENCOUNTER — Telehealth: Payer: Self-pay | Admitting: Family Medicine

## 2017-10-27 ENCOUNTER — Telehealth: Payer: Self-pay | Admitting: *Deleted

## 2017-10-27 MED ORDER — GABAPENTIN 100 MG PO CAPS: 200.0000 mg | ORAL_CAPSULE | Freq: Three times a day (TID) | ORAL | 3 refills | Status: DC

## 2017-10-27 NOTE — Telephone Encounter (Signed)
Please tell her to increase to 200 mg three times daily (double up on what she has left first)  I sent a new px to her [facility] If side effects or problems let me know We can go up more from there if needed

## 2017-10-27 NOTE — Telephone Encounter (Signed)
Copied from CRM [id number]. Topic: Quick Communication - See Telephone Encounter >> Oct 27, 2017 10:29 AM Oneal Grout wrote: CRM for notification. See Telephone encounter for: 10/27/17. Would like to speak to Dr Royden Purl CMA regarding follow up on gabapentin (NEURONTIN) 100 MG capsule, don't think its working much.

## 2017-10-27 NOTE — Telephone Encounter (Signed)
Pt notified of Dr. Royden Purl comments and will increase med to 2 tabs TID. Pt did want me to send this message to coumadin nurses so they are aware. She said last time her coumadin levels were elevated and the nurse thought it was from the gabapentin so she just wants to make sure they are aware and okay with this plan. Pt has coumadin appt on 11/03/16 and wants to make sure it's okay to wait till then to have it check. I advise pt I would route this message to them and they will call her back

## 2017-10-27 NOTE — Telephone Encounter (Signed)
Open in Error.

## 2017-10-27 NOTE — Telephone Encounter (Signed)
I spoke with pt; pt presently taking gabapentin  one in morning,one at lunch and one in PM. Pt is sleeping a little better; pt has more pain when she gets up and moves around. Pt wonders if can increase the gabapentin. Pt has plenty of med. Pt request cb.

## 2017-10-27 NOTE — Telephone Encounter (Signed)
Gabapentin typically does not interact with coumadin; however, I did note an odd fluctuation in INR and subsequent dosage adjustment had to be made when patient initially started it.  This could have been a coincidence but will watch her carefully.  Patient is okay to keep 11/03/17 appointment with coumadin clinic.  Patient made aware.  Thanks.

## 2017-11-03 ENCOUNTER — Ambulatory Visit (INDEPENDENT_AMBULATORY_CARE_PROVIDER_SITE_OTHER): Payer: Medicare Other

## 2017-11-03 DIAGNOSIS — I4891 Unspecified atrial fibrillation: Secondary | ICD-10-CM

## 2017-11-03 DIAGNOSIS — Z5181 Encounter for therapeutic drug level monitoring: Secondary | ICD-10-CM

## 2017-11-03 LAB — POCT INR: INR: 1.9

## 2017-11-03 NOTE — Patient Instructions (Signed)
INR: today 1.9  Take an extra 1/2 pill (1.25mg ) today 5/7 as already has taken 1 whole pill this am for a boost and then Continue to take decreased dosing: 1/2 pill (1.25mg ) on Mon, Wed, Fri and 1 pill (2.5mg ) the rest of the days.  Recheck in 2-3 weeks.    Patient is doing well without any changes to diet, health or medications other than an increase with her gabapentin.

## 2017-11-26 ENCOUNTER — Ambulatory Visit (INDEPENDENT_AMBULATORY_CARE_PROVIDER_SITE_OTHER): Payer: Medicare Other | Admitting: General Practice

## 2017-11-26 DIAGNOSIS — I4891 Unspecified atrial fibrillation: Secondary | ICD-10-CM

## 2017-11-26 DIAGNOSIS — Z5181 Encounter for therapeutic drug level monitoring: Secondary | ICD-10-CM

## 2017-11-26 LAB — POCT INR: INR: 1.5 — AB (ref 2.0–3.0)

## 2017-11-26 NOTE — Patient Instructions (Addendum)
Pre visit review using our clinic review tool, if applicable. No additional management support is needed unless otherwise documented below in the visit note.  Take 2 tablets today (5/30) and take 1 tablet tomorrow (5/31).  On Saturday start taking 1 tablet daily except 1/2 tablet on Wednesdays only.  Re-check in 2 weeks.

## 2017-12-10 ENCOUNTER — Ambulatory Visit (INDEPENDENT_AMBULATORY_CARE_PROVIDER_SITE_OTHER): Payer: Medicare Other | Admitting: General Practice

## 2017-12-10 DIAGNOSIS — I4891 Unspecified atrial fibrillation: Secondary | ICD-10-CM

## 2017-12-10 DIAGNOSIS — Z5181 Encounter for therapeutic drug level monitoring: Secondary | ICD-10-CM | POA: Diagnosis not present

## 2017-12-10 LAB — POCT INR: INR: 1.8 — AB (ref 2.0–3.0)

## 2017-12-10 NOTE — Patient Instructions (Addendum)
Pre visit review using our clinic review tool, if applicable. No additional management support is needed unless otherwise documented below in the visit note.   Take 2 tablets today (6/13) and then change dosage and taking 1 tablet daily.  Re-check in 2 weeks.

## 2017-12-24 ENCOUNTER — Ambulatory Visit (INDEPENDENT_AMBULATORY_CARE_PROVIDER_SITE_OTHER): Payer: Medicare Other | Admitting: General Practice

## 2017-12-24 DIAGNOSIS — Z5181 Encounter for therapeutic drug level monitoring: Secondary | ICD-10-CM

## 2017-12-24 DIAGNOSIS — I4891 Unspecified atrial fibrillation: Secondary | ICD-10-CM

## 2017-12-24 LAB — POCT INR: INR: 2.1 (ref 2.0–3.0)

## 2017-12-24 NOTE — Patient Instructions (Addendum)
Pre visit review using our clinic review tool, if applicable. No additional management support is needed unless otherwise documented below in the visit note.  Continue to take 1 tablet daily.  Re-check in 4 weeks.  

## 2017-12-29 ENCOUNTER — Other Ambulatory Visit: Payer: Self-pay | Admitting: Cardiovascular Disease

## 2018-01-02 ENCOUNTER — Other Ambulatory Visit: Payer: Self-pay | Admitting: Cardiovascular Disease

## 2018-01-04 NOTE — Telephone Encounter (Signed)
Called patient and clarified that patient is taking cardura 8 mg two times a day. Med list at last office visit lists cardura 8 mg two times a day. Patient has scheduled appointment coming up this month. Rx sent in for refill at cardura 8 mg BID.

## 2018-01-04 NOTE — Telephone Encounter (Signed)
Please review for refill.  Per last office note: "On cardura 4 BID" Pharmacy requesting 8MG  BID.

## 2018-01-16 NOTE — Progress Notes (Signed)
Cardiology Office Note  Date:  01/18/2018   ID:  Linda Johnson, DOB 1935-11-18, MRN [id number]  PCP:  Linda Pimple, MD   Chief Complaint  Patient presents with  . other    12 month f/u no complaints today. Meds reviewed verbally with pt.    HPI:  Linda Johnson is a 82 year old woman with a hx of  atrial fibrillation, chronic, On anticoagulation hypertension who presents for routine follow-up of her  PVCs, atrial fibrillation  Using a walker, ait instability No recent falls Nerve pain in her back and legs On gabapentin for pain Back pain, middle of her back lives alone, still drives Trying to stay independent Car dead this AM, relies on her daughter  Today she presents by herself, daughter is not present Denies any significant shortness of breath on exertion Does not appreciate her atrial fibrillation No regular exercise program No new symptoms  Blood pressure 130 to 140 at home, Higher today as she was stressed out about her car  No sx from PVCs  Taking cardura 8 BID Was previously on 4 BID (we had sent in 8 mg to cut in half at her request and she started taking the full pill On Cardura 8 mg twice a day reports blood pressure is still fine, not low She is inclined to leave the dosing the same  Lab work reviewed with her in detail Total chol 152, LDL 87  EKG personally reviewed by myself on todays visit shows atrial fibrillation with ventricular rate 99 bpm, PVCs, no signifant  ST abnormality  Other past medical history reviewed On a previous clinic visit, she was started on amiodarone in an attempt to cardiovert In follow-up she Declined cardioversion  Previously felt she had nausea from the low-dose amiodarone  Atrial fibrillation picked up by primary care on routine EKG early 2017   PMH:   has a past medical history of Hypertension.  PSH:    Past Surgical History:  Procedure Laterality Date  . BREAST BIOPSY Left 2011   2 areas, cysts per pt     Current Outpatient Medications  Medication Sig Dispense Refill  . calcium-vitamin D (OSCAL WITH D) 500-200 MG-UNIT per tablet Take 1 tablet by mouth 2 (two) times daily.    . Cholecalciferol (VITAMIN D3) 2000 UNITS TABS Take 1 tablet by mouth 2 (two) times daily.     Marland Kitchen diltiazem (CARDIZEM CD) 120 MG 24 hr capsule Take 1 capsule (120 mg total) by mouth daily. 30 capsule 3  . diltiazem (CARDIZEM CD) 240 MG 24 hr capsule TAKE 1 CAPSULE (240 MG TOTAL) BY MOUTH DAILY. 30 capsule 0  . doxazosin (CARDURA) 8 MG tablet TAKE 1 TABLET (8 MG TOTAL) BY MOUTH 2 (TWO) TIMES DAILY. 60 tablet 3  . gabapentin (NEURONTIN) 100 MG capsule Take 2 capsules (200 mg total) by mouth 3 (three) times daily. 180 capsule 3  . latanoprost (XALATAN) 0.005 % ophthalmic solution INSTILL ONE DROP IN BOTH EYES AT BEDTIME.  3  . losartan (COZAAR) 100 MG tablet Take 1 tablet (100 mg total) by mouth daily. 90 tablet 3  . warfarin (COUMADIN) 2.5 MG tablet TAKE AS DIRECTED BY ANTI-COAGULATION CLINIC. 90 tablet 1   No current facility-administered medications for this visit.      Allergies:   Ace inhibitors; Hydrocodone-homatropine; and Tramadol hcl   Social History:  The patient  reports that she has never smoked. She has never used smokeless tobacco. She reports that she does not drink  alcohol or use drugs.   Family History:   family history includes Breast cancer in her sister; Breast cancer (age of onset: 5650) in her maternal aunt; Leukemia in her sister.    Review of Systems: Review of Systems  Constitutional: Negative.   Respiratory: Negative.   Cardiovascular: Negative.   Gastrointestinal: Negative.   Musculoskeletal: Negative.        Gait instability, using a walker  Neurological: Negative.   Psychiatric/Behavioral: Negative.   All other systems reviewed and are negative.    PHYSICAL EXAM: VS:  BP (!) 148/78 (BP Location: Left Arm, Patient Position: Sitting, Cuff Size: Normal)   Pulse 99   Ht 5\' 5"  (1.651  m)   Wt 176 lb 8 oz (80.1 kg)   BMI 29.37 kg/m  , BMI Body mass index is 29.37 kg/m.  Needed only very mild assistance getting up on the table and getting down Constitutional:  oriented to person, place, and time. No distress.  HENT:  Head: Normocephalic and atraumatic.  Eyes:  no discharge. No scleral icterus.  Neck: Normal range of motion. Neck supple. No JVD present.  Cardiovascular: Normal rate, regular rhythm, normal heart sounds and intact distal pulses. Exam reveals no gallop and no friction rub. No edema No murmur heard. Pulmonary/Chest: Effort normal and breath sounds normal. No stridor. No respiratory distress.  no wheezes.  no rales.  no tenderness.  Abdominal: Soft.  no distension.  no tenderness.  Musculoskeletal: Normal range of motion.  no  tenderness or deformity.  Neurological:  normal muscle tone. Coordination normal. No atrophy Skin: Skin is warm and dry. No rash noted. not diaphoretic.  Psychiatric:  normal mood and affect. behavior is normal. Thought content normal.   Recent Labs: 10/02/2017: ALT 12; BUN 15; Creatinine, Ser 0.98; Hemoglobin 14.9; Platelets 239.0; Potassium 4.3; Sodium 139; TSH 4.32    Lipid Panel Lab Results  Component Value Date   CHOL 126 10/02/2017   HDL 50.60 10/02/2017   LDLCALC 56 10/02/2017   TRIG 101.0 10/02/2017      Wt Readings from Last 3 Encounters:  01/18/18 176 lb 8 oz (80.1 kg)  10/05/17 176 lb 8 oz (80.1 kg)  10/02/17 177 lb 12 oz (80.6 kg)       ASSESSMENT AND PLAN:  Persistent atrial fibrillation (HCC) - Plan: EKG 12-Lead Rate well controlled Tolerating warfarin, INR well controlled.  Previously unable to afford a NOAC  Essential hypertension Reports is well controlled at home Previously on Cardura 4 twice a day but she increase up to 8 mg twice a day on her own She prefers to continue on the higher dose for now as blood pressures well controlled  Mixed hyperlipidemia Currently not on a statin,  cholesterol  very well controlled on no medication  Encounter for anticoagulation discussion and counseling She is happy to stay on warfarin Unable to afford a NOAC. No changes made  Gait instability No recent falls Walks with a walker Balance issues is a chronic problem  PVCs  asymptomatic Seen on EKG, no further workup at this time    Total encounter time more than 25 minutes  Greater than 50% was spent in counseling and coordination of care with the patient   Disposition:   F/U  12 months   Orders Placed This Encounter  Procedures  . EKG 12-Lead     Signed, Dossie Arbourim Gollan, M.D., Ph.D. 01/18/2018  [facility], [place] [phone]

## 2018-01-18 ENCOUNTER — Encounter: Payer: Self-pay | Admitting: Cardiovascular Disease

## 2018-01-18 ENCOUNTER — Ambulatory Visit (INDEPENDENT_AMBULATORY_CARE_PROVIDER_SITE_OTHER): Payer: Medicare Other | Admitting: Cardiovascular Disease

## 2018-01-18 VITALS — BP 148/78 | HR 99 | Ht 65.0 in | Wt 176.5 lb

## 2018-01-18 DIAGNOSIS — I493 Ventricular premature depolarization: Secondary | ICD-10-CM | POA: Diagnosis not present

## 2018-01-18 DIAGNOSIS — R2689 Other abnormalities of gait and mobility: Secondary | ICD-10-CM

## 2018-01-18 DIAGNOSIS — M546 Pain in thoracic spine: Secondary | ICD-10-CM | POA: Diagnosis not present

## 2018-01-18 DIAGNOSIS — Z7189 Other specified counseling: Secondary | ICD-10-CM | POA: Diagnosis not present

## 2018-01-18 DIAGNOSIS — I4891 Unspecified atrial fibrillation: Secondary | ICD-10-CM | POA: Diagnosis not present

## 2018-01-18 DIAGNOSIS — I10 Essential (primary) hypertension: Secondary | ICD-10-CM | POA: Diagnosis not present

## 2018-01-18 NOTE — Patient Instructions (Signed)

## 2018-01-21 ENCOUNTER — Ambulatory Visit (INDEPENDENT_AMBULATORY_CARE_PROVIDER_SITE_OTHER): Payer: Medicare Other | Admitting: General Practice

## 2018-01-21 DIAGNOSIS — I4891 Unspecified atrial fibrillation: Secondary | ICD-10-CM

## 2018-01-21 DIAGNOSIS — Z5181 Encounter for therapeutic drug level monitoring: Secondary | ICD-10-CM | POA: Diagnosis not present

## 2018-01-21 DIAGNOSIS — Z7901 Long term (current) use of anticoagulants: Secondary | ICD-10-CM

## 2018-01-21 LAB — POCT INR: INR: 2.1 (ref 2.0–3.0)

## 2018-01-21 NOTE — Patient Instructions (Addendum)
Pre visit review using our clinic review tool, if applicable. No additional management support is needed unless otherwise documented below in the visit note.  Continue to take 1 tablet daily.  Re-check in 4 weeks.  

## 2018-01-23 ENCOUNTER — Other Ambulatory Visit: Payer: Self-pay | Admitting: Cardiovascular Disease

## 2018-02-05 ENCOUNTER — Other Ambulatory Visit: Payer: Self-pay

## 2018-02-05 MED ORDER — WARFARIN SODIUM 2.5 MG PO TABS: 2.5000 mg | ORAL_TABLET | Freq: Every day | ORAL | 1 refills | Status: DC

## 2018-02-05 NOTE — Telephone Encounter (Signed)
Patient is compliant with coumadin management, will refill x 6 months.    

## 2018-02-18 ENCOUNTER — Ambulatory Visit (INDEPENDENT_AMBULATORY_CARE_PROVIDER_SITE_OTHER): Payer: Medicare Other | Admitting: General Practice

## 2018-02-18 DIAGNOSIS — Z7901 Long term (current) use of anticoagulants: Secondary | ICD-10-CM

## 2018-02-18 DIAGNOSIS — I4891 Unspecified atrial fibrillation: Secondary | ICD-10-CM

## 2018-02-18 LAB — POCT INR: INR: 2 (ref 2.0–3.0)

## 2018-02-18 NOTE — Patient Instructions (Addendum)
Pre visit review using our clinic review tool, if applicable. No additional management support is needed unless otherwise documented below in the visit note.  Continue to take 1 tablet daily.  Re-check in 4 weeks.  

## 2018-03-15 ENCOUNTER — Ambulatory Visit: Payer: Self-pay

## 2018-03-18 ENCOUNTER — Ambulatory Visit (INDEPENDENT_AMBULATORY_CARE_PROVIDER_SITE_OTHER): Payer: Medicare Other | Admitting: General Practice

## 2018-03-18 DIAGNOSIS — I4891 Unspecified atrial fibrillation: Secondary | ICD-10-CM

## 2018-03-18 DIAGNOSIS — Z7901 Long term (current) use of anticoagulants: Secondary | ICD-10-CM | POA: Diagnosis not present

## 2018-03-18 LAB — POCT INR: INR: 2 (ref 2.0–3.0)

## 2018-03-18 NOTE — Patient Instructions (Signed)
Pre visit review using our clinic review tool, if applicable. No additional management support is needed unless otherwise documented below in the visit note.  Continue to take 1 tablet daily.  Re-check in 6 weeks.  

## 2018-03-30 ENCOUNTER — Other Ambulatory Visit: Payer: Self-pay | Admitting: Family Medicine

## 2018-03-30 NOTE — Telephone Encounter (Signed)
Last filled on 10/27/17 #180 caps with 3 additional refills, AWV and CPE both scheduled in April 2020

## 2018-04-25 ENCOUNTER — Other Ambulatory Visit: Payer: Self-pay | Admitting: Cardiovascular Disease

## 2018-04-29 ENCOUNTER — Ambulatory Visit: Payer: Medicare Other

## 2018-05-03 ENCOUNTER — Other Ambulatory Visit: Payer: Self-pay | Admitting: Cardiovascular Disease

## 2018-05-06 ENCOUNTER — Ambulatory Visit (INDEPENDENT_AMBULATORY_CARE_PROVIDER_SITE_OTHER): Payer: Medicare Other

## 2018-05-06 ENCOUNTER — Ambulatory Visit (INDEPENDENT_AMBULATORY_CARE_PROVIDER_SITE_OTHER): Payer: Medicare Other | Admitting: General Practice

## 2018-05-06 DIAGNOSIS — Z7901 Long term (current) use of anticoagulants: Secondary | ICD-10-CM | POA: Diagnosis not present

## 2018-05-06 DIAGNOSIS — Z23 Encounter for immunization: Secondary | ICD-10-CM | POA: Diagnosis not present

## 2018-05-06 LAB — POCT INR: INR: 2.2 (ref 2.0–3.0)

## 2018-05-06 NOTE — Patient Instructions (Addendum)
Pre visit review using our clinic review tool, if applicable. No additional management support is needed unless otherwise documented below in the visit note.  Continue to take 1 tablet daily.  Re-check in 6 weeks.  

## 2018-06-10 ENCOUNTER — Ambulatory Visit (INDEPENDENT_AMBULATORY_CARE_PROVIDER_SITE_OTHER): Payer: Medicare Other | Admitting: General Practice

## 2018-06-10 DIAGNOSIS — Z7901 Long term (current) use of anticoagulants: Secondary | ICD-10-CM | POA: Diagnosis not present

## 2018-06-10 DIAGNOSIS — I4891 Unspecified atrial fibrillation: Secondary | ICD-10-CM

## 2018-06-10 LAB — POCT INR: INR: 2.4 (ref 2.0–3.0)

## 2018-06-10 NOTE — Patient Instructions (Addendum)
Pre visit review using our clinic review tool, if applicable. No additional management support is needed unless otherwise documented below in the visit note.  Continue to take 1 tablet daily.  Re-check in 6 weeks.  

## 2018-06-30 DIAGNOSIS — H269 Unspecified cataract: Secondary | ICD-10-CM

## 2018-06-30 HISTORY — DX: Unspecified cataract: H26.9

## 2018-07-05 ENCOUNTER — Other Ambulatory Visit: Payer: Self-pay | Admitting: Family Medicine

## 2018-07-05 DIAGNOSIS — Z1231 Encounter for screening mammogram for malignant neoplasm of breast: Secondary | ICD-10-CM

## 2018-07-15 ENCOUNTER — Ambulatory Visit (INDEPENDENT_AMBULATORY_CARE_PROVIDER_SITE_OTHER): Payer: Medicare Other | Admitting: General Practice

## 2018-07-15 DIAGNOSIS — Z7901 Long term (current) use of anticoagulants: Secondary | ICD-10-CM | POA: Diagnosis not present

## 2018-07-15 DIAGNOSIS — I4891 Unspecified atrial fibrillation: Secondary | ICD-10-CM

## 2018-07-15 LAB — POCT INR: INR: 1.9 — AB (ref 2.0–3.0)

## 2018-07-15 NOTE — Patient Instructions (Addendum)
Pre visit review using our clinic review tool, if applicable. No additional management support is needed unless otherwise documented below in the visit note.  Take 2 tablets today only and then continue to take 1 tablet daily.  Re-check in 4 weeks.

## 2018-07-29 ENCOUNTER — Other Ambulatory Visit: Payer: Self-pay | Admitting: Family Medicine

## 2018-07-30 NOTE — Telephone Encounter (Signed)
Patient is compliant with coumadin management, will  Refill x 6 months.

## 2018-07-31 DIAGNOSIS — I509 Heart failure, unspecified: Secondary | ICD-10-CM

## 2018-07-31 HISTORY — DX: Heart failure, unspecified: I50.9

## 2018-08-12 ENCOUNTER — Ambulatory Visit (INDEPENDENT_AMBULATORY_CARE_PROVIDER_SITE_OTHER): Payer: Medicare Other | Admitting: General Practice

## 2018-08-12 DIAGNOSIS — Z7901 Long term (current) use of anticoagulants: Secondary | ICD-10-CM | POA: Diagnosis not present

## 2018-08-12 DIAGNOSIS — I4891 Unspecified atrial fibrillation: Secondary | ICD-10-CM

## 2018-08-12 LAB — POCT INR: INR: 4.4 — AB (ref 2.0–3.0)

## 2018-08-12 NOTE — Patient Instructions (Addendum)
Pre visit review using our clinic review tool, if applicable. No additional management support is needed unless otherwise documented below in the visit note.  Hold coumadin tomorrow and Saturday and then continue to take 1 tablet daily.  Re-check in 2 weeks.

## 2018-08-23 ENCOUNTER — Encounter: Payer: Self-pay | Admitting: Family Medicine

## 2018-08-23 ENCOUNTER — Ambulatory Visit (INDEPENDENT_AMBULATORY_CARE_PROVIDER_SITE_OTHER): Payer: Medicare Other

## 2018-08-23 ENCOUNTER — Telehealth: Payer: Self-pay

## 2018-08-23 ENCOUNTER — Ambulatory Visit (INDEPENDENT_AMBULATORY_CARE_PROVIDER_SITE_OTHER): Payer: Medicare Other | Admitting: Family Medicine

## 2018-08-23 VITALS — BP 106/60 | HR 71 | Temp 95.2°F | Ht 65.0 in | Wt 180.1 lb

## 2018-08-23 DIAGNOSIS — R002 Palpitations: Secondary | ICD-10-CM | POA: Diagnosis not present

## 2018-08-23 DIAGNOSIS — I482 Chronic atrial fibrillation, unspecified: Secondary | ICD-10-CM

## 2018-08-23 DIAGNOSIS — I4891 Unspecified atrial fibrillation: Secondary | ICD-10-CM

## 2018-08-23 DIAGNOSIS — R600 Localized edema: Secondary | ICD-10-CM

## 2018-08-23 DIAGNOSIS — R0602 Shortness of breath: Secondary | ICD-10-CM | POA: Diagnosis not present

## 2018-08-23 DIAGNOSIS — Z7901 Long term (current) use of anticoagulants: Secondary | ICD-10-CM

## 2018-08-23 DIAGNOSIS — E039 Hypothyroidism, unspecified: Secondary | ICD-10-CM | POA: Diagnosis not present

## 2018-08-23 DIAGNOSIS — I10 Essential (primary) hypertension: Secondary | ICD-10-CM

## 2018-08-23 LAB — CBC WITH DIFFERENTIAL/PLATELET
Basophils Absolute: 0 10*3/uL (ref 0.0–0.1)
Basophils Relative: 0.2 % (ref 0.0–3.0)
Eosinophils Absolute: 0 10*3/uL (ref 0.0–0.7)
Eosinophils Relative: 0.4 % (ref 0.0–5.0)
HCT: 39.3 % (ref 36.0–46.0)
Hemoglobin: 13.4 g/dL (ref 12.0–15.0)
Lymphocytes Relative: 9.8 % — ABNORMAL LOW (ref 12.0–46.0)
Lymphs Abs: 0.6 10*3/uL — ABNORMAL LOW (ref 0.7–4.0)
MCHC: 34.1 g/dL (ref 30.0–36.0)
MCV: 90.8 fl (ref 78.0–100.0)
Monocytes Absolute: 0.3 10*3/uL (ref 0.1–1.0)
Monocytes Relative: 4.4 % (ref 3.0–12.0)
Neutro Abs: 5.4 10*3/uL (ref 1.4–7.7)
Neutrophils Relative %: 85.2 % — ABNORMAL HIGH (ref 43.0–77.0)
Platelets: 144 10*3/uL — ABNORMAL LOW (ref 150.0–400.0)
RBC: 4.33 Mil/uL (ref 3.87–5.11)
RDW: 14 % (ref 11.5–15.5)
WBC: 6.3 10*3/uL (ref 4.0–10.5)

## 2018-08-23 LAB — BRAIN NATRIURETIC PEPTIDE: Pro B Natriuretic peptide (BNP): 246 pg/mL — ABNORMAL HIGH (ref 0.0–100.0)

## 2018-08-23 LAB — COMPREHENSIVE METABOLIC PANEL
ALT: 22 U/L (ref 0–35)
AST: 19 U/L (ref 0–37)
Albumin: 4.2 g/dL (ref 3.5–5.2)
Alkaline Phosphatase: 68 U/L (ref 39–117)
BUN: 20 mg/dL (ref 6–23)
CO2: 32 mEq/L (ref 19–32)
Calcium: 10.4 mg/dL (ref 8.4–10.5)
Chloride: 103 mEq/L (ref 96–112)
Creatinine, Ser: 1.19 mg/dL (ref 0.40–1.20)
GFR: 43.6 mL/min — ABNORMAL LOW (ref >60.00)
Glucose, Bld: 106 mg/dL — ABNORMAL HIGH (ref 70–99)
Potassium: 4.6 mEq/L (ref 3.5–5.1)
Sodium: 142 mEq/L (ref 135–145)
TOTAL PROTEIN: 7.5 g/dL (ref 6.0–8.3)
Total Bilirubin: 0.6 mg/dL (ref 0.2–1.2)

## 2018-08-23 LAB — TSH: TSH: 5.37 u[IU]/mL — ABNORMAL HIGH (ref 0.35–4.50)

## 2018-08-23 LAB — PROTIME-INR
INR: 7.1 ratio (ref 0.8–1.0)
Prothrombin Time: 79.5 s (ref 9.6–13.1)

## 2018-08-23 NOTE — Assessment & Plan Note (Signed)
More ankle swelling  Also sob on exertion /not fatigue  Taking nail and hair supplement-told her to stop  TSH today

## 2018-08-23 NOTE — Assessment & Plan Note (Signed)
Poss due from sitting more lately  Enc her to elevate legs and avoid excess sodium  Also c/o sob  Will check BNP as well as cbc/cmet and tsh today

## 2018-08-23 NOTE — Assessment & Plan Note (Signed)
Stable on EKG with good rate control  Takes warfarin and INR has been up (4, and then over 7 today-pending central draw confirmation)  She states she has had not confusion re: dosing  C/o more sob lately

## 2018-08-23 NOTE — Telephone Encounter (Signed)
Pt was seen 08/23/18.

## 2018-08-23 NOTE — Assessment & Plan Note (Addendum)
Since mid February  Has episodes - ? If exertional  Has kept her from doing much and now has some ankle swelling  Labs today  EKG and vitals are re assuring as is exam  Per pt this is episodic and started when she found out INR was high last month ? If possible it is related to anxiety as well

## 2018-08-23 NOTE — Telephone Encounter (Signed)
Pt said since seen for coumadin ck on 08/12/18 pt has had SOB on and off. Coumadin was elevated and pt was advised to not take warfarin for two days and restart on 08/15/18. On and off pt having SOB and afib, no CP or H/A but dizziness on 08/22/18. Pt not having any symptoms now but would like to be seen by Dr Milinda Antis. Pt scheduled appt on 08/23/18 at 12:30 to see Dr Milinda Antis and Alveda Reasons CMA said for pt to come 10' early and Mandy RN will ck coumadin level. Pt voiced understanding and does want to see Dr Milinda Antis instead of cardiologist. If pt develops CP, SOB prior to appt pt will go to [facility] or ED. Pt voiced understanding. FYI to Dr Milinda Antis.

## 2018-08-23 NOTE — Telephone Encounter (Signed)
Aware I will see her then 

## 2018-08-23 NOTE — Patient Instructions (Signed)
INR TODAY 7.1  *Patient is to stop the new hair, skin nail supplement she started in recent 1-2 months.  Hold coumadin tomorrow (2/25) as has already taken today, also hold Wednesday and Thursday.  Recheck level her in coumadin clinic on 2/27 to determine next steps and dosing.  Spoke with patient via phone and instructed her as above.  She verbalizes understanding and denies any unusual bruising or bleeding.  But, understands risks associated with supratherpeutic reading and will go to ER if any concerns develop.  Patient is to bring bottle of warfarin and the hair/skin/nail supplement with her to her visit on Thursday for coumadin nurse to review with her.

## 2018-08-23 NOTE — Assessment & Plan Note (Signed)
.   bp in fair control at this time  BP Readings from Last 1 Encounters:  08/23/18 106/60   No changes needed Most recent labs reviewed  Disc lifstyle change with low sodium diet and exercise

## 2018-08-23 NOTE — Patient Instructions (Addendum)
Stop the hair and nail supplement  Elevate your feet when you sit   We will check labs today (not only for INR) but also for possible causes of shortness of breath and swelling   Please make sure you are absolutely taking your medicines as ordered and not getting confused about them !   Watch sodium in your diet to help the ankle swelling   I may have you follow up with cardiology if needed   If symptoms worsen please let us and cardiology know  Also let us know if you develop any new symptoms like cough or fever

## 2018-08-23 NOTE — Assessment & Plan Note (Signed)
POCT INR is above 7  Sent via central draw for confirmatio  She is certain that she has not mis-taken medications and we reviewed them  Told her to stop the hair and nail supplement  No excess bleeding or bruising today

## 2018-08-23 NOTE — Progress Notes (Signed)
Subjective:    Patient ID: Linda Johnson, female    DOB: 06-Dec-1935, 83 y.o.   MRN: [id number]  HPI Here with s/o sob and palpitations   Wt Readings from Last 3 Encounters:  08/23/18 180 lb 2 oz (81.7 kg)  01/18/18 176 lb 8 oz (80.1 kg)  10/05/17 176 lb 8 oz (80.1 kg)   29.97 kg/m   Takes warfarin for a fib Lab Results  Component Value Date   INR 4.4 (A) 08/12/2018   INR 1.9 (A) 07/15/2018   INR 2.4 06/10/2018  INR was over 7 today  On 2/13 was told to hold coumadin for 2 days then resume 2.5 mg daily  Will confirm with central draw Does not know what she is doing differently   She sees Dr Mariah Milling for cardiology for a fib and PVCs and atrial fibrillation  She stays in persistent a fib  Tried amiodarone in the past-intolerant of this  Rate has been well controlled  Takes diltiazem 120 plus 240 daily  Also doxazosin 8 mg bid   She has been more sob than usual (not exertional)  ? If a little swollen in ankles more since yesterday no pnd or orthopnea   Does feel more anxious since her INR was high   BP is lower than usual  BP Readings from Last 3 Encounters:  08/23/18 106/60  01/18/18 (!) 148/78  10/05/17 126/74   Pulse Readings from Last 3 Encounters:  08/23/18 71  01/18/18 99  10/05/17 97     Last labs Lab Results  Component Value Date   CREATININE 0.98 10/02/2017   BUN 15 10/02/2017   NA 139 10/02/2017   K 4.3 10/02/2017   CL 101 10/02/2017   CO2 30 10/02/2017   Lab Results  Component Value Date   TSH 4.32 10/02/2017     Lab Results  Component Value Date   HGBA1C 5.8 10/02/2017   EKG- a fib with rate of 70 and no acute changes today  Patient Active Problem List   Diagnosis Date Noted  . Short of breath on exertion 08/23/2018  . Pedal edema 08/23/2018  . Long term (current) use of anticoagulants 01/21/2018  . PVC (premature ventricular contraction) 01/18/2018  . Poor balance 10/05/2017  . Post herpetic neuralgia 09/23/2017  . Thoracic  back pain 09/23/2017  . Encounter for therapeutic drug monitoring 09/24/2015  . Encounter for anticoagulation discussion and counseling 09/20/2015  . Irregular heart rate 09/17/2015  . Atrial fibrillation (HCC) 09/17/2015  . Hearing loss 09/17/2015  . Hypothyroidism 12/12/2014  . Hyperglycemia 09/11/2014  . Encounter for Medicare annual wellness exam 09/11/2014  . Estrogen deficiency 09/11/2014  . Routine general medical examination at a health care facility 09/09/2013  . Low back pain 03/21/2013  . Other screening mammogram 04/19/2012  . Post-menopausal 04/19/2012  . Obesity 08/18/2011  . Hyperlipidemia 06/12/2010  . Osteoporosis 06/12/2010  . ALLERGIC RHINITIS CAUSE UNSPECIFIED 04/12/2008  . HX, PERSONAL, COLONIC POLYPS 04/02/2007  . STRABISMUS 03/02/2007  . Essential hypertension 03/02/2007   Past Medical History:  Diagnosis Date  . Hypertension    Past Surgical History:  Procedure Laterality Date  . BREAST BIOPSY Left 2011   2 areas, cysts per pt   Social History   Tobacco Use  . Smoking status: Never Smoker  . Smokeless tobacco: Never Used  Substance Use Topics  . Alcohol use: No    Alcohol/week: 0.0 standard drinks  . Drug use: No   Family History  Problem Relation Age of Onset  . Leukemia Sister   . Breast cancer Sister   . Breast cancer Maternal Aunt 50   Allergies  Allergen Reactions  . Ace Inhibitors     REACTION: cough  . Hydrocodone-Homatropine Nausea Only  . Tramadol Hcl     nausea   Current Outpatient Medications on File Prior to Visit  Medication Sig Dispense Refill  . calcium-vitamin D (OSCAL WITH D) 500-200 MG-UNIT per tablet Take 1 tablet by mouth 2 (two) times daily.    . Cholecalciferol (VITAMIN D3) 2000 UNITS TABS Take 1 tablet by mouth 2 (two) times daily.     Marland Kitchen diltiazem (CARDIZEM CD) 120 MG 24 hr capsule Take 1 capsule (120 mg total) by mouth daily. 30 capsule 3  . diltiazem (CARDIZEM CD) 120 MG 24 hr capsule TAKE 1 CAPSULE (120 MG  TOTAL) BY MOUTH DAILY. 30 capsule 6  . diltiazem (CARDIZEM CD) 240 MG 24 hr capsule Take 1 capsule (240 mg total) by mouth daily. 30 capsule 6  . doxazosin (CARDURA) 8 MG tablet TAKE 1 TABLET (8 MG TOTAL) BY MOUTH 2 (TWO) TIMES DAILY. 180 tablet 2  . gabapentin (NEURONTIN) 100 MG capsule TAKE 2 CAPSULES (200 MG TOTAL) BY MOUTH 3 (THREE) TIMES DAILY. 180 capsule 5  . latanoprost (XALATAN) 0.005 % ophthalmic solution INSTILL ONE DROP IN BOTH EYES AT BEDTIME.  3  . losartan (COZAAR) 100 MG tablet Take 1 tablet (100 mg total) by mouth daily. 90 tablet 3  . warfarin (COUMADIN) 2.5 MG tablet TAKE 1 TABLET (2.5 MG TOTAL) BY MOUTH DAILY. OR AS DIRECTED BY COUMADIN CLINIC. 90 tablet 1   No current facility-administered medications on file prior to visit.     Review of Systems  Constitutional: Negative for activity change, appetite change, fatigue, fever and unexpected weight change.  HENT: Negative for congestion, ear pain, rhinorrhea, sinus pressure and sore throat.   Eyes: Negative for pain, redness and visual disturbance.  Respiratory: Positive for shortness of breath. Negative for cough, choking, chest tightness and wheezing.   Cardiovascular: Positive for palpitations and leg swelling. Negative for chest pain.  Gastrointestinal: Negative for abdominal pain, blood in stool, constipation and diarrhea.  Endocrine: Negative for polydipsia and polyuria.  Genitourinary: Negative for dysuria, frequency and urgency.  Musculoskeletal: Positive for back pain. Negative for arthralgias and myalgias.       Chronic back pain   Skin: Negative for pallor and rash.  Allergic/Immunologic: Negative for environmental allergies.  Neurological: Negative for dizziness, syncope and headaches.  Hematological: Negative for adenopathy. Does not bruise/bleed easily.  Psychiatric/Behavioral: Negative for decreased concentration and dysphoric mood. The patient is nervous/anxious.        Objective:   Physical  Exam Constitutional:      General: She is not in acute distress.    Appearance: She is well-developed. She is obese. She is not ill-appearing.  HENT:     Head: Normocephalic and atraumatic.     Nose: Nose normal.     Mouth/Throat:     Mouth: Mucous membranes are moist.     Pharynx: Oropharynx is clear.  Eyes:     Conjunctiva/sclera: Conjunctivae normal.     Pupils: Pupils are equal, round, and reactive to light.     Comments: Baseline strabismus  Neck:     Musculoskeletal: No neck rigidity.     Vascular: No carotid bruit.  Cardiovascular:     Rate and Rhythm: Normal rate. Rhythm irregular.  Pulses: Normal pulses.     Heart sounds: Normal heart sounds.     Comments: Baseline a fib Pulmonary:     Effort: Pulmonary effort is normal. No respiratory distress.     Breath sounds: No stridor. No wheezing, rhonchi or rales.     Comments: bs are slightly distant (kyphosis) No crackles, wheeze or rales or rhonchi Chest:     Chest wall: No tenderness.  Abdominal:     General: Abdomen is flat. Bowel sounds are normal.     Palpations: Abdomen is soft.  Musculoskeletal:     Right lower leg: Edema present.     Left lower leg: Edema present.     Comments: Trace pedal edema bilat No tenderness/palp cord Neg homan's sign   Lymphadenopathy:     Cervical: No cervical adenopathy.  Skin:    General: Skin is warm.     Coloration: Skin is not jaundiced or pale.     Findings: No rash.  Neurological:     Mental Status: She is alert. Mental status is at baseline.     Comments: Walks with a wheeled walker  Psychiatric:        Mood and Affect: Mood is anxious.     Comments: Quite anxious today   Very HOH- occ hard to communicate with            Assessment & Plan:   Problem List Items Addressed This Visit      Cardiovascular and Mediastinum   Essential hypertension    . bp in fair control at this time  BP Readings from Last 1 Encounters:  08/23/18 106/60   No changes  needed Most recent labs reviewed  Disc lifstyle change with low sodium diet and exercise        Atrial fibrillation (HCC)    Stable on EKG with good rate control  Takes warfarin and INR has been up (4, and then over 7 today-pending central draw confirmation)  She states she has had not confusion re: dosing  C/o more sob lately        Relevant Orders   Protime-INR   Protime-INR     Endocrine   Hypothyroidism    More ankle swelling  Also sob on exertion /not fatigue  Taking nail and hair supplement-told her to stop  TSH today       Relevant Orders   TSH     Other   Long term (current) use of anticoagulants    POCT INR is above 7  Sent via central draw for confirmatio  She is certain that she has not mis-taken medications and we reviewed them  Told her to stop the hair and nail supplement  No excess bleeding or bruising today      Relevant Orders   Protime-INR   Protime-INR   Short of breath on exertion - Primary    Since mid February  Has episodes - ? If exertional  Has kept her from doing much and now has some ankle swelling  Labs today  EKG and vitals are re assuring as is exam  Per pt this is episodic and started when she found out INR was high last month ? If possible it is related to anxiety as well        Relevant Orders   CBC with Differential/Platelet   Comprehensive metabolic panel   Brain natriuretic peptide   Pedal edema    Poss due from sitting more lately  Enc her to elevate  legs and avoid excess sodium  Also c/o sob  Will check BNP as well as cbc/cmet and tsh today         Other Visit Diagnoses    Palpitation       Relevant Orders   EKG 12-Lead (Completed)   Protime-INR   CBC with Differential/Platelet   Comprehensive metabolic panel   TSH

## 2018-08-24 ENCOUNTER — Telehealth: Payer: Self-pay

## 2018-08-24 ENCOUNTER — Encounter: Payer: Self-pay | Admitting: Emergency Medicine

## 2018-08-24 ENCOUNTER — Telehealth: Payer: Self-pay | Admitting: *Deleted

## 2018-08-24 ENCOUNTER — Emergency Department: Payer: Medicare Other

## 2018-08-24 ENCOUNTER — Telehealth: Payer: Self-pay | Admitting: Cardiovascular Disease

## 2018-08-24 ENCOUNTER — Other Ambulatory Visit: Payer: Self-pay

## 2018-08-24 DIAGNOSIS — Z7901 Long term (current) use of anticoagulants: Secondary | ICD-10-CM | POA: Diagnosis not present

## 2018-08-24 DIAGNOSIS — I4821 Permanent atrial fibrillation: Secondary | ICD-10-CM | POA: Diagnosis not present

## 2018-08-24 DIAGNOSIS — R0600 Dyspnea, unspecified: Secondary | ICD-10-CM | POA: Diagnosis present

## 2018-08-24 DIAGNOSIS — M4317 Spondylolisthesis, lumbosacral region: Secondary | ICD-10-CM | POA: Insufficient documentation

## 2018-08-24 DIAGNOSIS — K8020 Calculus of gallbladder without cholecystitis without obstruction: Secondary | ICD-10-CM | POA: Diagnosis not present

## 2018-08-24 DIAGNOSIS — R001 Bradycardia, unspecified: Secondary | ICD-10-CM | POA: Diagnosis not present

## 2018-08-24 DIAGNOSIS — I517 Cardiomegaly: Secondary | ICD-10-CM | POA: Diagnosis present

## 2018-08-24 DIAGNOSIS — E669 Obesity, unspecified: Secondary | ICD-10-CM | POA: Diagnosis not present

## 2018-08-24 DIAGNOSIS — K7689 Other specified diseases of liver: Secondary | ICD-10-CM | POA: Insufficient documentation

## 2018-08-24 DIAGNOSIS — E785 Hyperlipidemia, unspecified: Secondary | ICD-10-CM | POA: Insufficient documentation

## 2018-08-24 DIAGNOSIS — J90 Pleural effusion, not elsewhere classified: Secondary | ICD-10-CM | POA: Diagnosis present

## 2018-08-24 DIAGNOSIS — Z79899 Other long term (current) drug therapy: Secondary | ICD-10-CM | POA: Insufficient documentation

## 2018-08-24 DIAGNOSIS — G8929 Other chronic pain: Secondary | ICD-10-CM | POA: Diagnosis not present

## 2018-08-24 DIAGNOSIS — E039 Hypothyroidism, unspecified: Secondary | ICD-10-CM | POA: Diagnosis not present

## 2018-08-24 DIAGNOSIS — N281 Cyst of kidney, acquired: Secondary | ICD-10-CM | POA: Diagnosis not present

## 2018-08-24 DIAGNOSIS — K5730 Diverticulosis of large intestine without perforation or abscess without bleeding: Secondary | ICD-10-CM | POA: Insufficient documentation

## 2018-08-24 DIAGNOSIS — I700 Atherosclerosis of aorta: Secondary | ICD-10-CM | POA: Insufficient documentation

## 2018-08-24 DIAGNOSIS — I509 Heart failure, unspecified: Secondary | ICD-10-CM | POA: Insufficient documentation

## 2018-08-24 DIAGNOSIS — I110 Hypertensive heart disease with heart failure: Secondary | ICD-10-CM | POA: Diagnosis not present

## 2018-08-24 DIAGNOSIS — Z6829 Body mass index (BMI) 29.0-29.9, adult: Secondary | ICD-10-CM | POA: Diagnosis not present

## 2018-08-24 LAB — CBC WITH DIFFERENTIAL/PLATELET
Abs Immature Granulocytes: 0.01 10*3/uL (ref 0.00–0.07)
Basophils Absolute: 0 10*3/uL (ref 0.0–0.1)
Basophils Relative: 0 %
Eosinophils Absolute: 0 10*3/uL (ref 0.0–0.5)
Eosinophils Relative: 1 %
HCT: 41.3 % (ref 36.0–46.0)
Hemoglobin: 13.6 g/dL (ref 12.0–15.0)
Immature Granulocytes: 0 %
Lymphocytes Relative: 19 %
Lymphs Abs: 0.9 10*3/uL (ref 0.7–4.0)
MCH: 30.4 pg (ref 26.0–34.0)
MCHC: 32.9 g/dL (ref 30.0–36.0)
MCV: 92.2 fL (ref 80.0–100.0)
MONO ABS: 0.2 10*3/uL (ref 0.1–1.0)
Monocytes Relative: 4 %
NRBC: 0 % (ref 0.0–0.2)
Neutro Abs: 3.5 10*3/uL (ref 1.7–7.7)
Neutrophils Relative %: 76 %
Platelets: 141 10*3/uL — ABNORMAL LOW (ref 150–400)
RBC: 4.48 MIL/uL (ref 3.87–5.11)
RDW: 13.9 % (ref 11.5–15.5)
WBC: 4.6 10*3/uL (ref 4.0–10.5)

## 2018-08-24 LAB — TROPONIN I: Troponin I: <0.03 ng/mL (ref <0.03)

## 2018-08-24 LAB — COMPREHENSIVE METABOLIC PANEL
ALT: 30 U/L (ref 0–44)
AST: 29 U/L (ref 15–41)
Albumin: 4.3 g/dL (ref 3.5–5.0)
Alkaline Phosphatase: 67 U/L (ref 38–126)
Anion gap: 10 (ref 5–15)
BUN: 23 mg/dL (ref 8–23)
CALCIUM: 9.9 mg/dL (ref 8.9–10.3)
CO2: 26 mmol/L (ref 22–32)
Chloride: 104 mmol/L (ref 98–111)
Creatinine, Ser: 1 mg/dL (ref 0.44–1.00)
GFR calc Af Amer: >60 mL/min (ref >60)
GFR calc non Af Amer: 53 mL/min — ABNORMAL LOW (ref >60)
Glucose, Bld: 129 mg/dL — ABNORMAL HIGH (ref 70–99)
Potassium: 3.9 mmol/L (ref 3.5–5.1)
Sodium: 140 mmol/L (ref 135–145)
Total Bilirubin: 0.6 mg/dL (ref 0.3–1.2)
Total Protein: 7.7 g/dL (ref 6.5–8.1)

## 2018-08-24 LAB — PROTIME-INR
INR: 6 (ref 0.8–1.2)
Prothrombin Time: 52.5 seconds — ABNORMAL HIGH (ref 11.4–15.2)

## 2018-08-24 NOTE — Telephone Encounter (Signed)
Left VM requesting pt to call the office back 

## 2018-08-24 NOTE — Telephone Encounter (Signed)
Spoke with patient. On Sunday, she started having shortness of breath with exertion.  Denies chest pain or dizziness. Does report lower extremity swelling. Saw PCP yesterday who recommended she follow up with cardiology.  Patient not on diuretic. Patient agreeable to come in on Friday to see Eula Listen, PA-C. Appt scheduled.  She will call us back or pursue emergency medical attention if symptoms worsen or new ones develop.

## 2018-08-24 NOTE — Telephone Encounter (Signed)
I was unable to reach pt by phone; left v/m requesting pt to cb to [phone]. Angelica Chessman is out of office until 08/26/18.

## 2018-08-24 NOTE — ED Triage Notes (Signed)
Pt to triage via w/c with no distress noted; reports SHOB and upper abd; seen by PCP yesterday and PT/INR elevated

## 2018-08-24 NOTE — Telephone Encounter (Signed)
Addressed in result notes  

## 2018-08-24 NOTE — ED Notes (Addendum)
Lab reports INR 5.99; yesterday INR 7.1

## 2018-08-24 NOTE — Telephone Encounter (Signed)
Pt said she spoke with [facility] on 08/23/18 and has appt with Arline Asp RN for coumadin ck on 08/26/18. Pt said nothing further needed at this time.

## 2018-08-24 NOTE — Telephone Encounter (Signed)
[facility] Night - Client Nonclinical Telephone Record [facility] Medical Call Center Client [facility] Night - Client Client Site  [facility] [place] - Night Physician [name] - MD Contact Type Call Who Is Calling Patient / Member / Family / Caregiver Caller Name Kista Huschka Caller Phone Number [phone] Patient Name Linda Johnson Patient DOB 07/05/1935 Call Type Message Only Information Provided Reason for Call Request for General Office Information Initial Comment Caller states she wants to speak to [facility] Additional Comment Call Closed By: Lois Huxley Transaction Date/Time: 08/23/2018 5:14:56 PM (ET)

## 2018-08-24 NOTE — Progress Notes (Signed)
Noted  

## 2018-08-24 NOTE — Telephone Encounter (Signed)
Pt c/o Shortness Of Breath: STAT if SOB developed within the last 24 hours or pt is noticeably SOB on the phone  1. Are you currently SOB (can you hear that pt is SOB on the phone)? No, it comes and goes  2. How long have you been experiencing SOB? Since Sunday  3. Are you SOB when sitting or when up moving around? Moving around  4. Are you currently experiencing any other symptoms? Low BP 106/60  Patient went to PCP yesterday jEKG performed, coumadin was 7.1

## 2018-08-24 NOTE — Telephone Encounter (Signed)
-----   Message from Judy Pimple, MD sent at 08/23/2018  8:17 PM EST ----- Linda Johnson please check in on pt Tuesday to see how she is feeling  Would she like [place] to get a cardiology f/u for her ?  I know she has coumadin f/u on Thursday Thanks

## 2018-08-24 NOTE — Telephone Encounter (Signed)
Patient calling to check on status.

## 2018-08-25 ENCOUNTER — Other Ambulatory Visit: Payer: Self-pay

## 2018-08-25 ENCOUNTER — Encounter: Payer: Self-pay | Admitting: Radiology

## 2018-08-25 ENCOUNTER — Emergency Department: Payer: Medicare Other

## 2018-08-25 ENCOUNTER — Observation Stay
Admission: EM | Admit: 2018-08-25 | Discharge: 2018-08-26 | Disposition: A | Discharge status: Home / Self Care | Payer: Medicare Other | Attending: Internal Medicine | Admitting: Internal Medicine

## 2018-08-25 ENCOUNTER — Observation Stay (HOSPITAL_BASED_OUTPATIENT_CLINIC_OR_DEPARTMENT_OTHER)
Admit: 2018-08-25 | Discharge: 2018-08-25 | Disposition: A | Discharge status: Home / Self Care | Payer: Medicare Other | Attending: Internal Medicine | Admitting: Internal Medicine

## 2018-08-25 DIAGNOSIS — I5031 Acute diastolic (congestive) heart failure: Secondary | ICD-10-CM

## 2018-08-25 DIAGNOSIS — J90 Pleural effusion, not elsewhere classified: Secondary | ICD-10-CM

## 2018-08-25 DIAGNOSIS — I110 Hypertensive heart disease with heart failure: Secondary | ICD-10-CM

## 2018-08-25 DIAGNOSIS — I4821 Permanent atrial fibrillation: Secondary | ICD-10-CM | POA: Diagnosis not present

## 2018-08-25 DIAGNOSIS — I361 Nonrheumatic tricuspid (valve) insufficiency: Secondary | ICD-10-CM | POA: Diagnosis not present

## 2018-08-25 DIAGNOSIS — R0600 Dyspnea, unspecified: Secondary | ICD-10-CM

## 2018-08-25 DIAGNOSIS — I517 Cardiomegaly: Secondary | ICD-10-CM

## 2018-08-25 DIAGNOSIS — I340 Nonrheumatic mitral (valve) insufficiency: Secondary | ICD-10-CM | POA: Diagnosis not present

## 2018-08-25 DIAGNOSIS — R001 Bradycardia, unspecified: Secondary | ICD-10-CM | POA: Diagnosis present

## 2018-08-25 DIAGNOSIS — Z7189 Other specified counseling: Secondary | ICD-10-CM

## 2018-08-25 HISTORY — DX: Pleural effusion, not elsewhere classified: J90

## 2018-08-25 HISTORY — DX: Unspecified atrial fibrillation: I48.91

## 2018-08-25 LAB — TSH: TSH: 6.013 u[IU]/mL — ABNORMAL HIGH (ref 0.350–4.500)

## 2018-08-25 LAB — ECHOCARDIOGRAM COMPLETE

## 2018-08-25 LAB — BRAIN NATRIURETIC PEPTIDE: B Natriuretic Peptide: 165 pg/mL — ABNORMAL HIGH (ref 0.0–100.0)

## 2018-08-25 LAB — MAGNESIUM: MAGNESIUM: 1.9 mg/dL (ref 1.7–2.4)

## 2018-08-25 LAB — POTASSIUM: Potassium: 3.9 mmol/L (ref 3.5–5.1)

## 2018-08-25 MED ORDER — LATANOPROST 0.005 % OP SOLN
1.0000 [drp] | Freq: Every day | OPHTHALMIC | Status: DC
  Administered 2018-08-25: 1 [drp] via OPHTHALMIC
  Filled 2018-08-25: qty 2.5

## 2018-08-25 MED ORDER — ACETAMINOPHEN 325 MG PO TABS: 650.0000 mg | ORAL_TABLET | Freq: Four times a day (QID) | ORAL | Status: DC | PRN

## 2018-08-25 MED ORDER — ONDANSETRON HCL 4 MG/2ML IJ SOLN: 4.0000 mg | Freq: Four times a day (QID) | INTRAMUSCULAR | Status: DC | PRN

## 2018-08-25 MED ORDER — LOSARTAN POTASSIUM 50 MG PO TABS
100.0000 mg | ORAL_TABLET | Freq: Every day | ORAL | Status: DC
  Administered 2018-08-25 – 2018-08-26 (×2): 100 mg via ORAL
  Filled 2018-08-25 (×2): qty 2

## 2018-08-25 MED ORDER — VITAMIN D3 25 MCG (1000 UNIT) PO TABS
2000.0000 [IU] | ORAL_TABLET | Freq: Two times a day (BID) | ORAL | Status: DC
  Administered 2018-08-25 – 2018-08-26 (×3): 2000 [IU] via ORAL
  Filled 2018-08-25 (×7): qty 2

## 2018-08-25 MED ORDER — IOHEXOL 300 MG/ML  SOLN
100.0000 mL | Freq: Once | INTRAMUSCULAR | Status: AC | PRN
  Administered 2018-08-25: 100 mL via INTRAVENOUS

## 2018-08-25 MED ORDER — FUROSEMIDE 20 MG PO TABS
20.0000 mg | ORAL_TABLET | Freq: Every day | ORAL | Status: DC
  Administered 2018-08-26: 20 mg via ORAL
  Filled 2018-08-25: qty 1

## 2018-08-25 MED ORDER — FUROSEMIDE 10 MG/ML IJ SOLN
40.0000 mg | Freq: Once | INTRAMUSCULAR | Status: AC
  Administered 2018-08-25: 40 mg via INTRAVENOUS
  Filled 2018-08-25: qty 4

## 2018-08-25 MED ORDER — ONDANSETRON HCL 4 MG PO TABS: 4.0000 mg | ORAL_TABLET | Freq: Four times a day (QID) | ORAL | Status: DC | PRN

## 2018-08-25 MED ORDER — PREDNISOLONE ACETATE 1 % OP SUSP
1.0000 [drp] | Freq: Every day | OPHTHALMIC | Status: DC
  Filled 2018-08-25: qty 1

## 2018-08-25 MED ORDER — CALCIUM CARBONATE-VITAMIN D 500-200 MG-UNIT PO TABS
1.0000 | ORAL_TABLET | Freq: Two times a day (BID) | ORAL | Status: DC
  Administered 2018-08-25 – 2018-08-26 (×3): 1 via ORAL
  Filled 2018-08-25 (×3): qty 1

## 2018-08-25 MED ORDER — FUROSEMIDE 40 MG PO TABS: 40.0000 mg | ORAL_TABLET | Freq: Every day | ORAL | Status: DC

## 2018-08-25 MED ORDER — GABAPENTIN 100 MG PO CAPS
200.0000 mg | ORAL_CAPSULE | Freq: Three times a day (TID) | ORAL | Status: DC
  Administered 2018-08-25 – 2018-08-26 (×4): 200 mg via ORAL
  Filled 2018-08-25 (×5): qty 2

## 2018-08-25 MED ORDER — DOXAZOSIN MESYLATE 8 MG PO TABS
8.0000 mg | ORAL_TABLET | Freq: Two times a day (BID) | ORAL | Status: DC
  Administered 2018-08-25 – 2018-08-26 (×3): 8 mg via ORAL
  Filled 2018-08-25 (×4): qty 1

## 2018-08-25 MED ORDER — ACETAMINOPHEN 650 MG RE SUPP: 650.0000 mg | Freq: Four times a day (QID) | RECTAL | Status: DC | PRN

## 2018-08-25 MED ORDER — DOCUSATE SODIUM 100 MG PO CAPS
100.0000 mg | ORAL_CAPSULE | Freq: Two times a day (BID) | ORAL | Status: DC
  Administered 2018-08-25 – 2018-08-26 (×2): 100 mg via ORAL
  Filled 2018-08-25 (×2): qty 1

## 2018-08-25 NOTE — ED Notes (Signed)
Hospitalist in to admit 

## 2018-08-25 NOTE — ED Notes (Signed)
Pt daughter placed otc pain patch on pt prior to talking to RN or MD. Pt given warm blankets at this time

## 2018-08-25 NOTE — Progress Notes (Signed)
[employer] [employer] [facility] [place] [facility] [place]   PATIENT NAME: Linda Johnson    MR#:  [id number]  DATE OF BIRTH:  23-Jun-1936  SUBJECTIVE:  CHIEF COMPLAINT:   Chief Complaint  Patient presents with  . Shortness of Breath  Seen and evaluated today Decreased shortness of breath No spontaneous bleeds No chest pain  REVIEW OF SYSTEMS:    ROS  CONSTITUTIONAL: No documented fever. No fatigue, weakness. No weight gain, no weight loss.  EYES: No blurry or double vision.  ENT: No tinnitus. No postnasal drip. No redness of the oropharynx.  RESPIRATORY: No cough, no wheeze, no hemoptysis. Has dyspnea.  CARDIOVASCULAR: No chest pain. mild orthopnea. No palpitations. No syncope.  GASTROINTESTINAL: No nausea, no vomiting or diarrhea. No abdominal pain. No melena or hematochezia.  GENITOURINARY: No dysuria or hematuria.  ENDOCRINE: No polyuria or nocturia. No heat or cold intolerance.  HEMATOLOGY: No anemia. No bruising. No bleeding.  INTEGUMENTARY: No rashes. No lesions.  MUSCULOSKELETAL: No arthritis. mild swelling. No gout.  NEUROLOGIC: No numbness, tingling, or ataxia. No seizure-type activity.  PSYCHIATRIC: No anxiety. No insomnia. No ADD.   DRUG ALLERGIES:   Allergies  Allergen Reactions  . Ace Inhibitors     REACTION: cough  . Hydrocodone-Homatropine Nausea Only  . Tramadol Hcl     nausea    VITALS:  Blood pressure 115/64, pulse 79, temperature 97.9 F (36.6 C), temperature source Oral, resp. rate 16, height 5\' 5"  (1.651 m), weight 81.7 kg, SpO2 96 %.  PHYSICAL EXAMINATION:   Physical Exam  GENERAL:  83 y.o.-year-old patient lying in the bed with no acute distress.  EYES: Pupils equal, round, reactive to light and accommodation. No scleral icterus. Extraocular muscles intact.  HEENT: Head atraumatic, normocephalic. Oropharynx and nasopharynx clear.  NECK:  Supple, no jugular venous distention. No thyroid enlargement, no tenderness.  LUNGS: Decreased breath sounds  bilaterally, bibasilar creps heard. No use of accessory muscles of respiration.  CARDIOVASCULAR: S1, S2 normal. No murmurs, rubs, or gallops.  ABDOMEN: Soft, nontender, nondistended. Bowel sounds present. No organomegaly or mass.  EXTREMITIES: No cyanosis, clubbing  has edema b/l.    NEUROLOGIC: Cranial nerves II through XII are intact. No focal Motor or sensory deficits b/l.   PSYCHIATRIC: The patient is alert and oriented x 3.  SKIN: No obvious rash, lesion, or ulcer.   LABORATORY PANEL:   CBC Recent Labs  Lab 08/24/18 2209  WBC 4.6  HGB 13.6  HCT 41.3  PLT 141*   ------------------------------------------------------------------------------------------------------------------ Chemistries  Recent Labs  Lab 08/24/18 2209  NA 140  K 3.9  CL 104  CO2 26  GLUCOSE 129*  BUN 23  CREATININE 1.00  CALCIUM 9.9  AST 29  ALT 30  ALKPHOS 67  BILITOT 0.6   ------------------------------------------------------------------------------------------------------------------  Cardiac Enzymes Recent Labs  Lab 08/24/18 2209  TROPONINI <0.03   ------------------------------------------------------------------------------------------------------------------  RADIOLOGY:  Dg Chest 2 View  Result Date: 08/24/2018 CLINICAL DATA:  Shortness of breath EXAM: CHEST - 2 VIEW COMPARISON:  None. FINDINGS: Cardiomegaly. Small right pleural effusion. Bibasilar atelectasis or infiltrates, right greater than left. No edema or acute bony abnormality. IMPRESSION: Cardiomegaly. Small right pleural effusion with bibasilar atelectasis or infiltrates, right greater than left. Electronically Signed   By: Charlett Nose M.D.   On: 08/24/2018 22:34   Ct Chest W Contrast  Result Date: 08/25/2018 CLINICAL DATA:  Shortness of breath and bilateral upper abdominal pain. EXAM: CT CHEST, ABDOMEN, AND PELVIS WITH CONTRAST TECHNIQUE: Multidetector CT imaging  of the chest, abdomen and pelvis was performed following  the standard protocol during bolus administration of intravenous contrast. CONTRAST:  OMNIPAQUE IOHEXOL 300 MG/ML  SOLN COMPARISON:  None. FINDINGS: CT CHEST FINDINGS Cardiovascular: Cardiac enlargement. No pericardial effusions. Normal caliber thoracic aorta. No dissection. Aortic calcification. Coronary artery calcification. Mediastinum/Nodes: Esophagus is decompressed. Scattered lymph nodes in the mediastinum are not pathologically enlarged. Lungs/Pleura: Small right pleural effusion. Mild basilar atelectasis. Left lung is clear. Airways are patent. No pneumothorax. Musculoskeletal: Degenerative changes in the thoracic spine. Complete compression of the T4 vertebra. Old right rib fractures. CT ABDOMEN PELVIS FINDINGS Hepatobiliary: 2 cysts in the liver, largest in the left lobe measuring 2.5 cm diameter. Cholelithiasis with multiple stones in the gallbladder. No wall thickening or inflammatory changes. No bile duct dilatation. Pancreas: Unremarkable. No pancreatic ductal dilatation or surrounding inflammatory changes. Spleen: Normal in size without focal abnormality. Adrenals/Urinary Tract: No adrenal gland nodules. Cyst in the lower pole left kidney. No hydronephrosis or hydroureter. Nephrograms are symmetrical. Bladder is unremarkable. Stomach/Bowel: Stomach, small bowel, and colon are not abnormally distended. Stool fills the colon. No wall thickening or inflammatory changes. Diverticulosis of the sigmoid colon without evidence of diverticulitis. Appendix is normal. Vascular/Lymphatic: Aortic atherosclerosis. No enlarged abdominal or pelvic lymph nodes. Reproductive: Uterus and bilateral adnexa are unremarkable. Other: No abdominal wall hernia or abnormality. No abdominopelvic ascites. Musculoskeletal: Degenerative changes in the spine. No destructive bone lesions. Mild spondylolisthesis at L5-S1. IMPRESSION: 1. Small right pleural effusion with basilar atelectasis. 2. Benign-appearing hepatic and renal  cysts. 3. Cholelithiasis without evidence of cholecystitis. 4. Diverticulosis of the colon without evidence of diverticulitis. Aortic Atherosclerosis (ICD10-I70.0). Electronically Signed   By: Burman Nieves M.D.   On: 08/25/2018 01:26   Ct Abdomen Pelvis W Contrast  Result Date: 08/25/2018 CLINICAL DATA:  Shortness of breath and bilateral upper abdominal pain. EXAM: CT CHEST, ABDOMEN, AND PELVIS WITH CONTRAST TECHNIQUE: Multidetector CT imaging of the chest, abdomen and pelvis was performed following the standard protocol during bolus administration of intravenous contrast. CONTRAST:  OMNIPAQUE IOHEXOL 300 MG/ML  SOLN COMPARISON:  None. FINDINGS: CT CHEST FINDINGS Cardiovascular: Cardiac enlargement. No pericardial effusions. Normal caliber thoracic aorta. No dissection. Aortic calcification. Coronary artery calcification. Mediastinum/Nodes: Esophagus is decompressed. Scattered lymph nodes in the mediastinum are not pathologically enlarged. Lungs/Pleura: Small right pleural effusion. Mild basilar atelectasis. Left lung is clear. Airways are patent. No pneumothorax. Musculoskeletal: Degenerative changes in the thoracic spine. Complete compression of the T4 vertebra. Old right rib fractures. CT ABDOMEN PELVIS FINDINGS Hepatobiliary: 2 cysts in the liver, largest in the left lobe measuring 2.5 cm diameter. Cholelithiasis with multiple stones in the gallbladder. No wall thickening or inflammatory changes. No bile duct dilatation. Pancreas: Unremarkable. No pancreatic ductal dilatation or surrounding inflammatory changes. Spleen: Normal in size without focal abnormality. Adrenals/Urinary Tract: No adrenal gland nodules. Cyst in the lower pole left kidney. No hydronephrosis or hydroureter. Nephrograms are symmetrical. Bladder is unremarkable. Stomach/Bowel: Stomach, small bowel, and colon are not abnormally distended. Stool fills the colon. No wall thickening or inflammatory changes. Diverticulosis of the  sigmoid colon without evidence of diverticulitis. Appendix is normal. Vascular/Lymphatic: Aortic atherosclerosis. No enlarged abdominal or pelvic lymph nodes. Reproductive: Uterus and bilateral adnexa are unremarkable. Other: No abdominal wall hernia or abnormality. No abdominopelvic ascites. Musculoskeletal: Degenerative changes in the spine. No destructive bone lesions. Mild spondylolisthesis at L5-S1. IMPRESSION: 1. Small right pleural effusion with basilar atelectasis. 2. Benign-appearing hepatic and renal cysts. 3. Cholelithiasis without evidence of  cholecystitis. 4. Diverticulosis of the colon without evidence of diverticulitis. Aortic Atherosclerosis (ICD10-I70.0). Electronically Signed   By: Burman Nieves M.D.   On: 08/25/2018 01:26     ASSESSMENT AND PLAN:  83 year old female patient with history of atrial fibrillation, hypertension on anticoagulation with Coumadin presented to emergency room for difficulty breathing  -Acute diastolic heart failure exacerbation Diurese patient with Lasix Check echocardiogram Status post cardiology evaluation Input output chart and daily body weights  -Bradycardia improved  -Atrial fibrillation with rate under control Will resume diltiazem Warfarin on hold secondary to elevated INR  -Coumadin coagulopathy Follow-up INR Coumadin on hold  -Hypertension New oral losartan and Cardura  -DVT prophylaxis with sequential compression device to lower extremities  All the records are reviewed and case discussed with Care Management/Social Worker. Management plans discussed with the patient, family and they are in agreement.  CODE STATUS: Full code  DVT Prophylaxis: SCDs  TOTAL TIME TAKING CARE OF THIS PATIENT: 37 minutes.   POSSIBLE D/C IN 1 to 2 DAYS, DEPENDING ON CLINICAL CONDITION.  Ihor Austin M.D on 08/25/2018 at 1:46 PM  Between 7am to 6pm - Pager - [phone]  After 6pm go to [email or web address] - password [email or web address] [facility]  [facility] [place]  [facility]  Office  [phone]  CC: Primary care physician; Tower, Audrie Gallus, MD  Note: This dictation was prepared with Dragon dictation along with smaller phrase technology. Any transcriptional errors that result from this process are unintentional.

## 2018-08-25 NOTE — ED Notes (Signed)
Pt given lunch tray at this time

## 2018-08-25 NOTE — ED Notes (Signed)
Pt resting comfortably with eyes closed, no distress noted.  

## 2018-08-25 NOTE — ED Notes (Signed)
MD being paged at this time for cardiac monitor changes.

## 2018-08-25 NOTE — ED Notes (Signed)
Pharmacy contacted to send gabapentin for administration due to medication not being loaded in pyxis

## 2018-08-25 NOTE — ED Notes (Signed)
Urine output of 800 mL

## 2018-08-25 NOTE — ED Notes (Signed)
Pt given meal tray from dietary at this time.  

## 2018-08-25 NOTE — ED Notes (Signed)
Pt back from CT, purewick in place.

## 2018-08-25 NOTE — Consult Note (Signed)
Cardiology Consultation:   Patient ID: MABRY SANTARELLI MRN: [id number]; DOB: 1936/03/28  Admit date: 08/25/2018 Date of Consult: 08/25/2018  Primary Care Provider: Judy Pimple, MD Primary Cardiologist: Dr. Mariah Milling Primary Electrophysiologist:  None    Patient Profile:   Linda Johnson is a 83 y.o. female with a hx of chronic atrial fibrillation on anticoagulation, HTN, hyperlipidemia, hypothyroidism, obesity, chronic back and leg pain, and osteoporosis who is being seen today for the evaluation of bradycardia at the request of Dr. Sheryle Hail.  History of Present Illness:   Linda Johnson is an 83 year old female with PMH as above and including chronic Afib with no plan for future rhythm control. Per review of EMR, she has declined cardioversion in the past and did not convert on previous attempt to convert with amiodarone. She also reported nausea on low dose amiodarone. Last seen by [facility] cardiology 01/18/2018 for follow-up of PVCs and asymptomatic atrial fibrillation.    Since that time, she has continued living on her own at home and using a walker. Local family includes 1 of her 3 daughters. No recent falls. She still drives. She occasionally takes her BP with a wrist cuff with last SBP in 120s. She has remained asymptomatic with her atrial fibrillation/PVCs. She admits to adding extra salt to her food, frequently eating microwaveable food, and most often getting take out food to eat for dinner. She does not take daily weights at home. She also does not restrict her fluid intake. No recent hospital admissions though she reportedly has upcoming cataract surgery. She reported medication compliance at the prescribed dosages.   On 08/12/2018, she experienced dyspnea / SOB with minimal activity that started 2/13 then initially improved. She went to her PCP's office on 2/13 for a coumadin check with noted elevated INR at 4 and coumadin held x2 days (until 2/16). Her SOB returned 2/23 (Sunday) and  progressed from only occurring with minimal activity to then occurring at rest and while watching television. Associated symptoms also now included bilateral LEE. She thus called and got an appointment to see her PCP on 2/24. At that time, INR noted to have increased from 4  7.1 with patient indicating confusion regarding the dosing. Coumadin was held. On physical exam, per EMR, mild bilateral extremity edema was noted and thought to be mostly dependent edema 2/2 sitting more often. Patient also reported poor diet compliance with increase in sodium. SOB ddx included possible anxiety v cardiac etiology and it was recommended that she see Cardiology in the future. The patient continued to have progressive SOB after this appointment, and she decided to call [facility] Cardiology 2/25 with c/o SOB and low BP of 106/60. She was scheduled with the office to see Linda Listen, PA-C on 2/28. She was advised to present to the ED if worsening sx or new sx developed before that time.  On 2/26, she presented to the [facility] ED d/t continued progressive SOB with minimal exertion /at rest, a new symptom of upper abdominal tightness/pain, and continued bilateral lower extremity edema. No CP or dizziness reported. She reported the upper abdominal tightness started the previous night (2/25). She rated the abdominal pain 6/10, non-radiating and not chest pain, and non-pleuritic, as well as non-positional. EKG showed atrial fibrillation with PVCs. Vitals significant for soft BP 114/52. Chest x-ray showed cardiomegaly with small right pleural effusion and bibasilar atelectasis or infiltrates also seen on CT.  Bradycardia was noted on telemetry per ED MD in setting of Afib with PVCs vs  aberrancy. Labs with creatinine 1.00, liver function normal, BNP 165.0, troponin negative, INR 6.0. She was started on IV Lasix 40 mg in the ED with alleviation of SOB and resolution of her abdominal pain. Echo ordered per ED MD. Cardiology consulted.  Past Medical  History:  Diagnosis Date  . Atrial fibrillation (HCC)   . Hypertension     Past Surgical History:  Procedure Laterality Date  . BREAST BIOPSY Left 2011   2 areas, cysts per pt     Home Medications:  Prior to Admission medications   Medication Sig Start Date End Date Taking? Authorizing Provider  calcium-vitamin D (OSCAL WITH D) 500-200 MG-UNIT per tablet Take 1 tablet by mouth 2 (two) times daily.   Yes [provider]  Cholecalciferol (VITAMIN D3) 2000 UNITS TABS Take 1 tablet by mouth 2 (two) times daily.    Yes [provider]  diltiazem (CARDIZEM CD) 120 MG 24 hr capsule TAKE 1 CAPSULE (120 MG TOTAL) BY MOUTH DAILY. 04/26/18  Yes Antonieta Iba, MD  diltiazem (CARDIZEM CD) 240 MG 24 hr capsule Take 1 capsule (240 mg total) by mouth daily. 01/25/18 01/26/19 Yes Gollan, Tollie Pizza, MD  doxazosin (CARDURA) 8 MG tablet TAKE 1 TABLET (8 MG TOTAL) BY MOUTH 2 (TWO) TIMES DAILY. 05/03/18  Yes Gollan, Tollie Pizza, MD  gabapentin (NEURONTIN) 100 MG capsule TAKE 2 CAPSULES (200 MG TOTAL) BY MOUTH 3 (THREE) TIMES DAILY. 03/30/18  Yes Tower, Marne A, MD  latanoprost (XALATAN) 0.005 % ophthalmic solution INSTILL ONE DROP IN BOTH EYES AT BEDTIME. 12/08/16  Yes [provider]  losartan (COZAAR) 100 MG tablet Take 1 tablet (100 mg total) by mouth daily. 10/05/17  Yes Tower, Audrie Gallus, MD  Multiple Vitamins-Minerals (HAIR SKIN AND NAILS FORMULA) TABS Take 1 tablet by mouth daily.   Yes [provider]  prednisoLONE acetate (PRED FORTE) 1 % ophthalmic suspension Place 1 drop into both eyes at bedtime. 07/06/18  Yes [provider]  warfarin (COUMADIN) 2.5 MG tablet TAKE 1 TABLET (2.5 MG TOTAL) BY MOUTH DAILY. OR AS DIRECTED BY COUMADIN CLINIC. 07/30/18  Yes Tower, Audrie Gallus, MD    Inpatient Medications: Scheduled Meds: . calcium-vitamin D  1 tablet Oral BID  . cholecalciferol  2,000 Units Oral BID  . docusate sodium  100 mg Oral BID  . doxazosin  8 mg Oral BID  .  gabapentin  200 mg Oral TID  . latanoprost  1 drop Both Eyes QHS  . losartan  100 mg Oral Daily  . prednisoLONE acetate  1 drop Both Eyes QHS   Continuous Infusions:  PRN Meds: acetaminophen **OR** acetaminophen, ondansetron **OR** ondansetron (ZOFRAN) IV  Allergies:    Allergies  Allergen Reactions  . Ace Inhibitors     REACTION: cough  . Hydrocodone-Homatropine Nausea Only  . Tramadol Hcl     nausea    Social History:   Social History   Socioeconomic History  . Marital status: Divorced    Spouse name: Not on file  . Number of children: Not on file  . Years of education: Not on file  . Highest education level: Not on file  Occupational History  . Not on file  Social Needs  . Financial resource strain: Not on file  . Food insecurity:    Worry: Not on file    Inability: Not on file  . Transportation needs:    Medical: Not on file    Non-medical: Not on file  Tobacco Use  .  Smoking status: Never Smoker  . Smokeless tobacco: Never Used  Substance and Sexual Activity  . Alcohol use: No    Alcohol/week: 0.0 standard drinks  . Drug use: No  . Sexual activity: Never  Lifestyle  . Physical activity:    Days per week: Not on file    Minutes per session: Not on file  . Stress: Not on file  Relationships  . Social connections:    Talks on phone: Not on file    Gets together: Not on file    Attends religious service: Not on file    Active member of club or organization: Not on file    Attends meetings of clubs or organizations: Not on file    Relationship status: Not on file  . Intimate partner violence:    Fear of current or ex partner: Not on file    Emotionally abused: Not on file    Physically abused: Not on file    Forced sexual activity: Not on file  Other Topics Concern  . Not on file  Social History Narrative  . Not on file    Family History:    Family History  Problem Relation Age of Onset  . Leukemia Sister   . Breast cancer Sister   . Breast  cancer Maternal Aunt 50     ROS:  Please see the history of present illness.  Review of Systems  Constitutional: Negative for chills, diaphoresis and fever.  Respiratory: Positive for shortness of breath. Negative for cough.        SOB improving  Cardiovascular: Positive for chest pain, orthopnea and leg swelling. Negative for palpitations.       Denies current chest pain. CP alleviated with draining fluid. Leg edema improved and not present on exam  Gastrointestinal: Positive for abdominal pain.  Musculoskeletal: Negative for falls.  Neurological: Negative for dizziness, focal weakness, loss of consciousness, weakness and headaches.  All other systems reviewed and are negative.   All other ROS reviewed and negative.     Physical Exam/Data:   Vitals:   08/25/18 0930 08/25/18 0938 08/25/18 0941 08/25/18 1000  BP: 105/67 (!) 112/57 (!) 112/57 103/61  Pulse: (!) 29  (!) 54 (!) 48  Resp: 17   18  Temp:      TempSrc:      SpO2: 96%  98% 97%  Weight:      Height:        Intake/Output Summary (Last 24 hours) at 08/25/2018 1045 Last data filed at 08/25/2018 0500 Gross per 24 hour  Intake -  Output 1000 ml  Net -1000 ml   Filed Weights   08/24/18 2201  Weight: 81.7 kg   Body mass index is 29.97 kg/m.  General:  Obese female in no acute distress, accompanied by daughter from [place] HEENT: normal Neck: JVD difficult to assess d/t body habitus Vascular: No carotid bruits; FA pulses 2+ bilaterally without bruits  Cardiac:  IRIR, no murmur  Lungs: Reduced breath sounds at R lower base, left base with crackles  Abd: distended, nontender, no hepatomegaly  Ext: no bilateral lower extremity edema Musculoskeletal:  No deformities, BUE and BLE strength normal and equal Skin: warm and dry  Neuro:  no focal abnormalities noted Psych:  Normal affect   EKG:  The EKG was personally reviewed and demonstrates:  Afib Telemetry:  Telemetry was personally reviewed and demonstrates:   IRIR  Relevant CV Studies: Pending echo 2/26  10/08/2015 - Left ventricle: The cavity  size was normal. Systolic function was   normal. The estimated ejection fraction was in the range of 60%   to 65%. Wall motion was normal; there were no regional wall   motion abnormalities. The study is not technically sufficient to   allow evaluation of LV diastolic function. - Aortic valve: There was trivial regurgitation. - Left atrium: The atrium was normal in size. - Right ventricle: Systolic function was normal. - Pulmonary arteries: Systolic pressure was within the normal   range.  Impressions:  - Rhythm is atrial fibrillation.  Laboratory Data:  Chemistry Recent Labs  Lab 08/23/18 1319 08/24/18 2209  NA 142 140  K 4.6 3.9  CL 103 104  CO2 32 26  GLUCOSE 106* 129*  BUN 20 23  CREATININE 1.19 1.00  CALCIUM 10.4 9.9  GFRNONAA  --  53*  GFRAA  --  >60  ANIONGAP  --  10    Recent Labs  Lab 08/23/18 1319 08/24/18 2209  PROT 7.5 7.7  ALBUMIN 4.2 4.3  AST 19 29  ALT 22 30  ALKPHOS 68 67  BILITOT 0.6 0.6   Hematology Recent Labs  Lab 08/23/18 1319 08/24/18 2209  WBC 6.3 4.6  RBC 4.33 4.48  HGB 13.4 13.6  HCT 39.3 41.3  MCV 90.8 92.2  MCH  --  30.4  MCHC 34.1 32.9  RDW 14.0 13.9  PLT 144.0* 141*   Cardiac Enzymes Recent Labs  Lab 08/24/18 2209  TROPONINI <0.03   No results for input(s): TROPIPOC in the last 168 hours.  BNP Recent Labs  Lab 08/23/18 1319 08/24/18 2209  BNP  --  165.0*  PROBNP 246.0*  --     DDimer No results for input(s): DDIMER in the last 168 hours.  Radiology/Studies:  Dg Chest 2 View  Result Date: 08/24/2018 CLINICAL DATA:  Shortness of breath EXAM: CHEST - 2 VIEW COMPARISON:  None. FINDINGS: Cardiomegaly. Small right pleural effusion. Bibasilar atelectasis or infiltrates, right greater than left. No edema or acute bony abnormality. IMPRESSION: Cardiomegaly. Small right pleural effusion with bibasilar atelectasis or infiltrates,  right greater than left. Electronically Signed   By: Charlett Nose M.D.   On: 08/24/2018 22:34   Ct Chest W Contrast  Result Date: 08/25/2018 CLINICAL DATA:  Shortness of breath and bilateral upper abdominal pain. EXAM: CT CHEST, ABDOMEN, AND PELVIS WITH CONTRAST TECHNIQUE: Multidetector CT imaging of the chest, abdomen and pelvis was performed following the standard protocol during bolus administration of intravenous contrast. CONTRAST:  OMNIPAQUE IOHEXOL 300 MG/ML  SOLN COMPARISON:  None. FINDINGS: CT CHEST FINDINGS Cardiovascular: Cardiac enlargement. No pericardial effusions. Normal caliber thoracic aorta. No dissection. Aortic calcification. Coronary artery calcification. Mediastinum/Nodes: Esophagus is decompressed. Scattered lymph nodes in the mediastinum are not pathologically enlarged. Lungs/Pleura: Small right pleural effusion. Mild basilar atelectasis. Left lung is clear. Airways are patent. No pneumothorax. Musculoskeletal: Degenerative changes in the thoracic spine. Complete compression of the T4 vertebra. Old right rib fractures. CT ABDOMEN PELVIS FINDINGS Hepatobiliary: 2 cysts in the liver, largest in the left lobe measuring 2.5 cm diameter. Cholelithiasis with multiple stones in the gallbladder. No wall thickening or inflammatory changes. No bile duct dilatation. Pancreas: Unremarkable. No pancreatic ductal dilatation or surrounding inflammatory changes. Spleen: Normal in size without focal abnormality. Adrenals/Urinary Tract: No adrenal gland nodules. Cyst in the lower pole left kidney. No hydronephrosis or hydroureter. Nephrograms are symmetrical. Bladder is unremarkable. Stomach/Bowel: Stomach, small bowel, and colon are not abnormally distended. Stool fills the colon. No  wall thickening or inflammatory changes. Diverticulosis of the sigmoid colon without evidence of diverticulitis. Appendix is normal. Vascular/Lymphatic: Aortic atherosclerosis. No enlarged abdominal or pelvic lymph  nodes. Reproductive: Uterus and bilateral adnexa are unremarkable. Other: No abdominal wall hernia or abnormality. No abdominopelvic ascites. Musculoskeletal: Degenerative changes in the spine. No destructive bone lesions. Mild spondylolisthesis at L5-S1. IMPRESSION: 1. Small right pleural effusion with basilar atelectasis. 2. Benign-appearing hepatic and renal cysts. 3. Cholelithiasis without evidence of cholecystitis. 4. Diverticulosis of the colon without evidence of diverticulitis. Aortic Atherosclerosis (ICD10-I70.0). Electronically Signed   By: Burman Nieves M.D.   On: 08/25/2018 01:26   Ct Abdomen Pelvis W Contrast  Result Date: 08/25/2018 CLINICAL DATA:  Shortness of breath and bilateral upper abdominal pain. EXAM: CT CHEST, ABDOMEN, AND PELVIS WITH CONTRAST TECHNIQUE: Multidetector CT imaging of the chest, abdomen and pelvis was performed following the standard protocol during bolus administration of intravenous contrast. CONTRAST:  OMNIPAQUE IOHEXOL 300 MG/ML  SOLN COMPARISON:  None. FINDINGS: CT CHEST FINDINGS Cardiovascular: Cardiac enlargement. No pericardial effusions. Normal caliber thoracic aorta. No dissection. Aortic calcification. Coronary artery calcification. Mediastinum/Nodes: Esophagus is decompressed. Scattered lymph nodes in the mediastinum are not pathologically enlarged. Lungs/Pleura: Small right pleural effusion. Mild basilar atelectasis. Left lung is clear. Airways are patent. No pneumothorax. Musculoskeletal: Degenerative changes in the thoracic spine. Complete compression of the T4 vertebra. Old right rib fractures. CT ABDOMEN PELVIS FINDINGS Hepatobiliary: 2 cysts in the liver, largest in the left lobe measuring 2.5 cm diameter. Cholelithiasis with multiple stones in the gallbladder. No wall thickening or inflammatory changes. No bile duct dilatation. Pancreas: Unremarkable. No pancreatic ductal dilatation or surrounding inflammatory changes. Spleen: Normal in size  without focal abnormality. Adrenals/Urinary Tract: No adrenal gland nodules. Cyst in the lower pole left kidney. No hydronephrosis or hydroureter. Nephrograms are symmetrical. Bladder is unremarkable. Stomach/Bowel: Stomach, small bowel, and colon are not abnormally distended. Stool fills the colon. No wall thickening or inflammatory changes. Diverticulosis of the sigmoid colon without evidence of diverticulitis. Appendix is normal. Vascular/Lymphatic: Aortic atherosclerosis. No enlarged abdominal or pelvic lymph nodes. Reproductive: Uterus and bilateral adnexa are unremarkable. Other: No abdominal wall hernia or abnormality. No abdominopelvic ascites. Musculoskeletal: Degenerative changes in the spine. No destructive bone lesions. Mild spondylolisthesis at L5-S1. IMPRESSION: 1. Small right pleural effusion with basilar atelectasis. 2. Benign-appearing hepatic and renal cysts. 3. Cholelithiasis without evidence of cholecystitis. 4. Diverticulosis of the colon without evidence of diverticulitis. Aortic Atherosclerosis (ICD10-I70.0). Electronically Signed   By: Burman Nieves M.D.   On: 08/25/2018 01:26    Assessment and Plan:   HFpEF / Volume Overload - SOB improving with IV diuresis and likely d/t volume overload in setting of fluid intake and increased salt, and as relatively sedentary. Additional IV diuresis ordered in the ED. CXR and CT as above with R pleural effusion. Exam notable for abdominal distention, slight L base crackles, R base reduced base breath sounds. Improving with diuresis. Pending echo. - Will plan to send home with PRN lasix, likely 20mg , to take based on symptoms and daily home weights. Instructed patient to take 1 lasix daily if weight gain more than 3 lbs from dry weight and reassess if return to baseline dry weight. If more than 5 lbs from baseline dry weight or SOB/symptoms return, call office. Reviewed 2g sodium restriction, 2L fluid restriction and advised to only drink if  thirsty. Patient does have a home scale and daughter agreed to call and make sure weighing herself.  - Further  recommendations if needed pending echo, done today and after IV diuresis completed. On discharge, continue Cardura, Losartan with the PRN lasix.   Chronic Atrial Fibrillation on anticoagulation - Asymptomatic in Afib per documentation. No CP. Presented with SOB in setting of R pleural effusion and elevated volume status.  - Controlled ventricular rates though at times bradycardic with soft BP. Will decrease diltiazem to 120mg  daily. No plan for cardioversion as above in HPI.  - Supratherapeutic on Coumadin. Suspicion that patient may be taking more Coumadin than prescribed and confused regarding dosage. Recommendation provided to daughter in room that her sister (the local daughter) assist her mother with her medications / a daily pill case if at all possible. In the future, patient and family may need to also consider assisted living facility given patient has a history of unsteady gate (remote h/o falls, ambulates with walker) and risk of bleeding after fall on anticoagulation especially if INR supratherapeutic d/t patient confusion regarding dosing. Discussed obtaining a life alert call button in the event that she falls and given she lives alone. Also discussed driving, as patient continues to drive on her own.  - Continue to hold coumadin and recheck INR now in the ED and after discharge with restart likely 2/29 and dependent on INR rechecks in Coumadin clinic. Will need office follow-up and has appointment with Linda Listen, PA-C this Friday. Patient previously indicated cannot afford NOAC and happy with coumadin.   HTN - Last known home BP (wrist cuff) SBP 120s. Hypotensive to normotensive in ED. Plan to continue Cardura 8mg  daily, and as above, cardizem reduced to 120mg  daily. Home BP checks recommended given occasional report of dizziness per review of EMR.  HLD - Not on a statin given  cholesterol documented as well controlled not on medication   For questions or updates, please contact [facility] Please consult [email or web address] for contact info under     Signed, Lennon Alstrom, PA-C  08/25/2018 10:45 AM

## 2018-08-25 NOTE — ED Provider Notes (Signed)
[facility] Emergency Department Provider Note _____   First MD Initiated Contact with Patient 08/25/18 0017     (approximate)  I have reviewed the triage vital signs and the nursing notes.   HISTORY  Chief Complaint Shortness of Breath   HPI Linda Johnson is a 83 y.o. female with below list of chronic medical conditions presents to the emergency department progressive dyspnea and upper abdominal discomfort which patient states started yesterday.  Patient states that she saw her PCP yesterday and that her INR was elevated at that time.  Patient also admits to bilateral lower extremity swelling.  Patient admits to squeezing upper abdominal discomfort.   Past Medical History:  Diagnosis Date  . Atrial fibrillation (HCC)   . Hypertension     Patient Active Problem List   Diagnosis Date Noted  . Short of breath on exertion 08/23/2018  . Pedal edema 08/23/2018  . Long term (current) use of anticoagulants 01/21/2018  . PVC (premature ventricular contraction) 01/18/2018  . Poor balance 10/05/2017  . Post herpetic neuralgia 09/23/2017  . Thoracic back pain 09/23/2017  . Encounter for therapeutic drug monitoring 09/24/2015  . Encounter for anticoagulation discussion and counseling 09/20/2015  . Irregular heart rate 09/17/2015  . Atrial fibrillation (HCC) 09/17/2015  . Hearing loss 09/17/2015  . Hypothyroidism 12/12/2014  . Hyperglycemia 09/11/2014  . Encounter for Medicare annual wellness exam 09/11/2014  . Estrogen deficiency 09/11/2014  . Routine general medical examination at a health care facility 09/09/2013  . Low back pain 03/21/2013  . Other screening mammogram 04/19/2012  . Post-menopausal 04/19/2012  . Obesity 08/18/2011  . Hyperlipidemia 06/12/2010  . Osteoporosis 06/12/2010  . ALLERGIC RHINITIS CAUSE UNSPECIFIED 04/12/2008  . HX, PERSONAL, COLONIC POLYPS 04/02/2007  . STRABISMUS 03/02/2007  . Essential hypertension 03/02/2007     Past Surgical History:  Procedure Laterality Date  . BREAST BIOPSY Left 2011   2 areas, cysts per pt    Prior to Admission medications   Medication Sig Start Date End Date Taking? Authorizing Provider  calcium-vitamin D (OSCAL WITH D) 500-200 MG-UNIT per tablet Take 1 tablet by mouth 2 (two) times daily.    [provider]  Cholecalciferol (VITAMIN D3) 2000 UNITS TABS Take 1 tablet by mouth 2 (two) times daily.     [provider]  diltiazem (CARDIZEM CD) 120 MG 24 hr capsule Take 1 capsule (120 mg total) by mouth daily. 09/04/17   Antonieta Iba, MD  diltiazem (CARDIZEM CD) 120 MG 24 hr capsule TAKE 1 CAPSULE (120 MG TOTAL) BY MOUTH DAILY. 04/26/18   Antonieta Iba, MD  diltiazem (CARDIZEM CD) 240 MG 24 hr capsule Take 1 capsule (240 mg total) by mouth daily. 01/25/18 01/26/19  Antonieta Iba, MD  doxazosin (CARDURA) 8 MG tablet TAKE 1 TABLET (8 MG TOTAL) BY MOUTH 2 (TWO) TIMES DAILY. 05/03/18   Antonieta Iba, MD  gabapentin (NEURONTIN) 100 MG capsule TAKE 2 CAPSULES (200 MG TOTAL) BY MOUTH 3 (THREE) TIMES DAILY. 03/30/18   Tower, Audrie Gallus, MD  latanoprost (XALATAN) 0.005 % ophthalmic solution INSTILL ONE DROP IN BOTH EYES AT BEDTIME. 12/08/16   [provider]  losartan (COZAAR) 100 MG tablet Take 1 tablet (100 mg total) by mouth daily. 10/05/17   Tower, Audrie Gallus, MD  warfarin (COUMADIN) 2.5 MG tablet TAKE 1 TABLET (2.5 MG TOTAL) BY MOUTH DAILY. OR AS DIRECTED BY COUMADIN CLINIC. 07/30/18   Tower, Audrie Gallus, MD    Allergies  Ace inhibitors; Hydrocodone-homatropine; and Tramadol hcl  Family History  Problem Relation Age of Onset  . Leukemia Sister   . Breast cancer Sister   . Breast cancer Maternal Aunt 44    Social History Social History   Tobacco Use  . Smoking status: Never Smoker  . Smokeless tobacco: Never Used  Substance Use Topics  . Alcohol use: No    Alcohol/week: 0.0 standard drinks  . Drug use: No    Review of  Systems Constitutional: No fever/chills Eyes: No visual changes. ENT: No sore throat. Cardiovascular: Denies chest pain. Respiratory: Positive for shortness of breath. Gastrointestinal: No abdominal pain.  No nausea, no vomiting.  No diarrhea.  No constipation. Genitourinary: Negative for dysuria. Musculoskeletal: Negative for neck pain.  Negative for back pain.  Positive for bilateral lower extremity swelling Integumentary: Negative for rash. Neurological: Negative for headaches, focal weakness or numbness.  ____________________________________________   PHYSICAL EXAM:  VITAL SIGNS: ED Triage Vitals  Enc Vitals Group     BP 08/24/18 2159 (!) 114/52     Pulse Rate 08/24/18 2159 73     Resp 08/24/18 2159 18     Temp 08/24/18 2159 97.9 F (36.6 C)     Temp Source 08/24/18 2159 Oral     SpO2 08/24/18 2159 98 %     Weight 08/24/18 2201 81.7 kg (180 lb 1.9 oz)     Height 08/24/18 2201 1.651 m (5\' 5" )     Head Circumference --      Peak Flow --      Pain Score 08/24/18 2201 3     Pain Loc --      Pain Edu? --      Excl. in GC? --     Constitutional: Alert and oriented.  Apparent dyspnea eyes: Conjunctivae are normal.  Mouth/Throat: Mucous membranes are moist. Oropharynx non-erythematous. Neck: No stridor. Cardiovascular: Normal rate, regular rhythm. Good peripheral circulation. Grossly normal heart sounds. Respiratory: Normal respiratory effort.  No retractions.  Bibasilar rales Gastrointestinal: Soft and nontender. No distention.  Musculoskeletal: 1+ bilateral lower extremity pitting edema Neurologic:  Normal speech and language. No gross focal neurologic deficits are appreciated.  Skin:  Skin is warm, dry and intact. No rash noted. Psychiatric: Mood and affect are normal. Speech and behavior are normal.  ____________________________________________   LABS (all labs ordered are listed, but only abnormal results are displayed)  Labs Reviewed  CBC WITH  DIFFERENTIAL/PLATELET - Abnormal; Notable for the following components:      Result Value   Platelets 141 (*)    All other components within normal limits  COMPREHENSIVE METABOLIC PANEL - Abnormal; Notable for the following components:   Glucose, Bld 129 (*)    GFR calc non Af Amer 53 (*)    All other components within normal limits  PROTIME-INR - Abnormal; Notable for the following components:   Prothrombin Time 52.5 (*)    INR 6.0 (*)    All other components within normal limits  TROPONIN I   ____________________________________________  EKG  ED ECG REPORT I, [place] Malayah Demuro, the attending physician, personally viewed and interpreted this ECG.   Date: 08/24/2018  EKG Time: 10:07 PM  Rate: 68  Rhythm: Sinus rhythm with premature ventricular contractions  Axis: Normal  Intervals: Irregular RR interval secondary to PVC  ST&T Change: None  ____________________________________________  RADIOLOGY I, [place] Dasie Chancellor, personally viewed and evaluated these images (plain radiographs) as part of my medical decision making, as well as reviewing  the written report by the radiologist.  ED MD interpretation: Cardiomegaly, small right pleural effusion with bibasilar atelectasis or infiltrates right greater than left.  Official radiology report(s): Dg Chest 2 View  Result Date: 08/24/2018 CLINICAL DATA:  Shortness of breath EXAM: CHEST - 2 VIEW COMPARISON:  None. FINDINGS: Cardiomegaly. Small right pleural effusion. Bibasilar atelectasis or infiltrates, right greater than left. No edema or acute bony abnormality. IMPRESSION: Cardiomegaly. Small right pleural effusion with bibasilar atelectasis or infiltrates, right greater than left. Electronically Signed   By: Charlett Nose M.D.   On: 08/24/2018 22:34     Procedures   ____________________________________________   INITIAL IMPRESSION / MDM / ASSESSMENT AND PLAN / ED COURSE  As part of my medical decision making, I reviewed the  following data within the electronic MEDICAL RECORD NUMBER    83 year old female presenting with above-stated history and physical exam secondary to dyspnea.  Clinical exam concerning for possible CHF given lower extremity edema and bibasilar rales.  Chest x-ray consistent with cardiomegaly with pleural effusion.  Patient given Lasix 40 mg IV in the emergency department.  Laboratory data notable for supratherapeutic INR of 6.0.     ____________________________________________  FINAL CLINICAL IMPRESSION(S) / ED DIAGNOSES  Final diagnoses:  Dyspnea, unspecified type  Pleural effusion  Cardiomegaly     MEDICATIONS GIVEN DURING THIS VISIT:  Medications - No data to display   ED Discharge Orders    None       Note:  This document was prepared using Dragon voice recognition software and may include unintentional dictation errors.   Darci Current, MD 08/25/18 [phone]

## 2018-08-25 NOTE — Progress Notes (Signed)
*  PRELIMINARY RESULTS* Echocardiogram 2D Echocardiogram has been performed.  Cristela Blue 08/25/2018, 9:33 AM

## 2018-08-25 NOTE — ED Notes (Signed)
Suction canister emptied at this time. Urine output of 

## 2018-08-25 NOTE — Care Management Obs Status (Signed)
MEDICARE OBSERVATION STATUS NOTIFICATION   Patient Details  Name: Linda Johnson MRN: [id number] Date of Birth: Nov 05, 1935   Medicare Observation Status Notification Given:  Yes    Barrie Dunker, RN 08/25/2018, 10:56 AM

## 2018-08-25 NOTE — ED Notes (Signed)
Pt to CT

## 2018-08-25 NOTE — H&P (Signed)
Linda Johnson is an 83 y.o. female.   Chief Complaint: Shortness of breath HPI: The patient with past medical history of atrial fibrillation hypertension presents to the emergency department due to shortness of breath.  The patient has been communicating with her cardiology nurses this week regarding her Coumadin therapy.  She is supratherapeutic with an INR of 7.  She communicated with nursing/case management that she was intermittently short of breath.  She was instructed to come to the emergency department if her dyspnea worsened.  The patient noticed throughout the day that her feet were swelling and that the waistband of her pants was digging into her skin.  Her shortness of breath also became acute which prompted evaluation in the emergency department where she was found to have pulmonary vascular congestion on chest x-ray.  The patient received Lasix IV in the emergency department.  She does not carry a diagnosis of CHF.  She was also found to have bradycardia as well which is a new problem for her which prompted the emergency department staff to call the hospitalist service for admission.  Past Medical History:  Diagnosis Date  . Atrial fibrillation (HCC)   . Hypertension     Past Surgical History:  Procedure Laterality Date  . BREAST BIOPSY Left 2011   2 areas, cysts per pt    Family History  Problem Relation Age of Onset  . Leukemia Sister   . Breast cancer Sister   . Breast cancer Maternal Aunt 8   Social History:  reports that she has never smoked. She has never used smokeless tobacco. She reports that she does not drink alcohol or use drugs.  Allergies:  Allergies  Allergen Reactions  . Ace Inhibitors     REACTION: cough  . Hydrocodone-Homatropine Nausea Only  . Tramadol Hcl     nausea    Prior to Admission medications   Medication Sig Start Date End Date Taking? Authorizing Provider  calcium-vitamin D (OSCAL WITH D) 500-200 MG-UNIT per tablet Take 1 tablet by mouth  2 (two) times daily.   Yes [provider]  Cholecalciferol (VITAMIN D3) 2000 UNITS TABS Take 1 tablet by mouth 2 (two) times daily.    Yes [provider]  diltiazem (CARDIZEM CD) 120 MG 24 hr capsule TAKE 1 CAPSULE (120 MG TOTAL) BY MOUTH DAILY. 04/26/18  Yes Antonieta Iba, MD  diltiazem (CARDIZEM CD) 240 MG 24 hr capsule Take 1 capsule (240 mg total) by mouth daily. 01/25/18 01/26/19 Yes Gollan, Tollie Pizza, MD  doxazosin (CARDURA) 8 MG tablet TAKE 1 TABLET (8 MG TOTAL) BY MOUTH 2 (TWO) TIMES DAILY. 05/03/18  Yes Gollan, Tollie Pizza, MD  gabapentin (NEURONTIN) 100 MG capsule TAKE 2 CAPSULES (200 MG TOTAL) BY MOUTH 3 (THREE) TIMES DAILY. 03/30/18  Yes Tower, Marne A, MD  latanoprost (XALATAN) 0.005 % ophthalmic solution INSTILL ONE DROP IN BOTH EYES AT BEDTIME. 12/08/16  Yes [provider]  losartan (COZAAR) 100 MG tablet Take 1 tablet (100 mg total) by mouth daily. 10/05/17  Yes Tower, Audrie Gallus, MD  Multiple Vitamins-Minerals (HAIR SKIN AND NAILS FORMULA) TABS Take 1 tablet by mouth daily.   Yes [provider]  prednisoLONE acetate (PRED FORTE) 1 % ophthalmic suspension Place 1 drop into both eyes at bedtime. 07/06/18  Yes [provider]  warfarin (COUMADIN) 2.5 MG tablet TAKE 1 TABLET (2.5 MG TOTAL) BY MOUTH DAILY. OR AS DIRECTED BY COUMADIN CLINIC. 07/30/18  Yes Tower, Audrie Gallus, MD  Results for orders placed or performed during the hospital encounter of 08/25/18 (from the past 48 hour(s))  CBC with Differential     Status: Abnormal   Collection Time: 08/24/18 10:09 PM  Result Value Ref Range   WBC 4.6 4.0 - 10.5 K/uL   RBC 4.48 3.87 - 5.11 MIL/uL   Hemoglobin 13.6 12.0 - 15.0 g/dL   HCT 16.1 09.6 - 04.5 %   MCV 92.2 80.0 - 100.0 fL   MCH 30.4 26.0 - 34.0 pg   MCHC 32.9 30.0 - 36.0 g/dL   RDW 40.9 81.1 - 91.4 %   Platelets 141 (L) 150 - 400 K/uL   nRBC 0.0 0.0 - 0.2 %   Neutrophils Relative % 76 %   Neutro Abs 3.5 1.7 - 7.7 K/uL   Lymphocytes  Relative 19 %   Lymphs Abs 0.9 0.7 - 4.0 K/uL   Monocytes Relative 4 %   Monocytes Absolute 0.2 0.1 - 1.0 K/uL   Eosinophils Relative 1 %   Eosinophils Absolute 0.0 0.0 - 0.5 K/uL   Basophils Relative 0 %   Basophils Absolute 0.0 0.0 - 0.1 K/uL   Immature Granulocytes 0 %   Abs Immature Granulocytes 0.01 0.00 - 0.07 K/uL    Comment: Performed at [facility], [address] [place], [place] [address]  Comprehensive metabolic panel     Status: Abnormal   Collection Time: 08/24/18 10:09 PM  Result Value Ref Range   Sodium 140 135 - 145 mmol/L   Potassium 3.9 3.5 - 5.1 mmol/L   Chloride 104 98 - 111 mmol/L   CO2 26 22 - 32 mmol/L   Glucose, Bld 129 (H) 70 - 99 mg/dL   BUN 23 8 - 23 mg/dL   Creatinine, Ser 6.21 0.44 - 1.00 mg/dL   Calcium 9.9 8.9 - 30.8 mg/dL   Total Protein 7.7 6.5 - 8.1 g/dL   Albumin 4.3 3.5 - 5.0 g/dL   AST 29 15 - 41 U/L   ALT 30 0 - 44 U/L   Alkaline Phosphatase 67 38 - 126 U/L   Total Bilirubin 0.6 0.3 - 1.2 mg/dL   GFR calc non Af Amer 53 (L) >60 mL/min   GFR calc Af Amer >60 >60 mL/min   Anion gap 10 5 - 15    Comment: Performed at [facility], [address] [place], [place] [address]  Troponin I - ONCE - STAT     Status: None   Collection Time: 08/24/18 10:09 PM  Result Value Ref Range   Troponin I <0.03 <0.03 ng/mL    Comment: Performed at [facility], [address] [place], [place] [address]  Protime-INR     Status: Abnormal   Collection Time: 08/24/18 10:09 PM  Result Value Ref Range   Prothrombin Time 52.5 (H) 11.4 - 15.2 seconds   INR 6.0 (HH) 0.8 - 1.2    Comment: CRITICAL RESULT CALLED TO, READ BACK BY AND VERIFIED WITH: LISA THOMPSON 08/24/2018 2326 REC (NOTE) INR goal varies based on device and disease states. Performed at [facility], [address] [place], [place] [address]   Brain natriuretic peptide     Status: Abnormal   Collection Time: 08/24/18 10:09 PM  Result Value Ref Range    B Natriuretic Peptide 165.0 (H) 0.0 - 100.0 pg/mL    Comment: Performed at [facility], [address], [place], [place] [address]   Dg Chest 2 View  Result Date:  08/24/2018 CLINICAL DATA:  Shortness of breath EXAM: CHEST - 2 VIEW COMPARISON:  None. FINDINGS: Cardiomegaly. Small right pleural effusion. Bibasilar atelectasis or infiltrates, right greater than left. No edema or acute bony abnormality. IMPRESSION: Cardiomegaly. Small right pleural effusion with bibasilar atelectasis or infiltrates, right greater than left. Electronically Signed   By: Charlett NoseKevin  Dover M.D.   On: 08/24/2018 22:34   Ct Chest W Contrast  Result Date: 08/25/2018 CLINICAL DATA:  Shortness of breath and bilateral upper abdominal pain. EXAM: CT CHEST, ABDOMEN, AND PELVIS WITH CONTRAST TECHNIQUE: Multidetector CT imaging of the chest, abdomen and pelvis was performed following the standard protocol during bolus administration of intravenous contrast. CONTRAST:  100mL OMNIPAQUE IOHEXOL 300 MG/ML  SOLN COMPARISON:  None. FINDINGS: CT CHEST FINDINGS Cardiovascular: Cardiac enlargement. No pericardial effusions. Normal caliber thoracic aorta. No dissection. Aortic calcification. Coronary artery calcification. Mediastinum/Nodes: Esophagus is decompressed. Scattered lymph nodes in the mediastinum are not pathologically enlarged. Lungs/Pleura: Small right pleural effusion. Mild basilar atelectasis. Left lung is clear. Airways are patent. No pneumothorax. Musculoskeletal: Degenerative changes in the thoracic spine. Complete compression of the T4 vertebra. Old right rib fractures. CT ABDOMEN PELVIS FINDINGS Hepatobiliary: 2 cysts in the liver, largest in the left lobe measuring 2.5 cm diameter. Cholelithiasis with multiple stones in the gallbladder. No wall thickening or inflammatory changes. No bile duct dilatation. Pancreas: Unremarkable. No pancreatic ductal dilatation or surrounding inflammatory changes. Spleen: Normal in size  without focal abnormality. Adrenals/Urinary Tract: No adrenal gland nodules. Cyst in the lower pole left kidney. No hydronephrosis or hydroureter. Nephrograms are symmetrical. Bladder is unremarkable. Stomach/Bowel: Stomach, small bowel, and colon are not abnormally distended. Stool fills the colon. No wall thickening or inflammatory changes. Diverticulosis of the sigmoid colon without evidence of diverticulitis. Appendix is normal. Vascular/Lymphatic: Aortic atherosclerosis. No enlarged abdominal or pelvic lymph nodes. Reproductive: Uterus and bilateral adnexa are unremarkable. Other: No abdominal wall hernia or abnormality. No abdominopelvic ascites. Musculoskeletal: Degenerative changes in the spine. No destructive bone lesions. Mild spondylolisthesis at L5-S1. IMPRESSION: 1. Small right pleural effusion with basilar atelectasis. 2. Benign-appearing hepatic and renal cysts. 3. Cholelithiasis without evidence of cholecystitis. 4. Diverticulosis of the colon without evidence of diverticulitis. Aortic Atherosclerosis (ICD10-I70.0). Electronically Signed   By: Burman NievesWilliam  Stevens M.D.   On: 08/25/2018 01:26   Ct Abdomen Pelvis W Contrast  Result Date: 08/25/2018 CLINICAL DATA:  Shortness of breath and bilateral upper abdominal pain. EXAM: CT CHEST, ABDOMEN, AND PELVIS WITH CONTRAST TECHNIQUE: Multidetector CT imaging of the chest, abdomen and pelvis was performed following the standard protocol during bolus administration of intravenous contrast. CONTRAST:  100mL OMNIPAQUE IOHEXOL 300 MG/ML  SOLN COMPARISON:  None. FINDINGS: CT CHEST FINDINGS Cardiovascular: Cardiac enlargement. No pericardial effusions. Normal caliber thoracic aorta. No dissection. Aortic calcification. Coronary artery calcification. Mediastinum/Nodes: Esophagus is decompressed. Scattered lymph nodes in the mediastinum are not pathologically enlarged. Lungs/Pleura: Small right pleural effusion. Mild basilar atelectasis. Left lung is clear. Airways  are patent. No pneumothorax. Musculoskeletal: Degenerative changes in the thoracic spine. Complete compression of the T4 vertebra. Old right rib fractures. CT ABDOMEN PELVIS FINDINGS Hepatobiliary: 2 cysts in the liver, largest in the left lobe measuring 2.5 cm diameter. Cholelithiasis with multiple stones in the gallbladder. No wall thickening or inflammatory changes. No bile duct dilatation. Pancreas: Unremarkable. No pancreatic ductal dilatation or surrounding inflammatory changes. Spleen: Normal in size without focal abnormality. Adrenals/Urinary Tract: No adrenal gland nodules. Cyst in the lower pole left kidney. No hydronephrosis or hydroureter. Nephrograms are symmetrical.  Bladder is unremarkable. Stomach/Bowel: Stomach, small bowel, and colon are not abnormally distended. Stool fills the colon. No wall thickening or inflammatory changes. Diverticulosis of the sigmoid colon without evidence of diverticulitis. Appendix is normal. Vascular/Lymphatic: Aortic atherosclerosis. No enlarged abdominal or pelvic lymph nodes. Reproductive: Uterus and bilateral adnexa are unremarkable. Other: No abdominal wall hernia or abnormality. No abdominopelvic ascites. Musculoskeletal: Degenerative changes in the spine. No destructive bone lesions. Mild spondylolisthesis at L5-S1. IMPRESSION: 1. Small right pleural effusion with basilar atelectasis. 2. Benign-appearing hepatic and renal cysts. 3. Cholelithiasis without evidence of cholecystitis. 4. Diverticulosis of the colon without evidence of diverticulitis. Aortic Atherosclerosis (ICD10-I70.0). Electronically Signed   By: Burman Nieves M.D.   On: 08/25/2018 01:26    Review of Systems  Constitutional: Negative for chills and fever.  HENT: Negative for sore throat and tinnitus.   Eyes: Negative for blurred vision and redness.  Respiratory: Positive for shortness of breath. Negative for cough.   Cardiovascular: Positive for leg swelling. Negative for chest pain,  palpitations, orthopnea and PND.  Gastrointestinal: Negative for abdominal pain, diarrhea, nausea and vomiting.  Genitourinary: Negative for dysuria, frequency and urgency.  Musculoskeletal: Negative for joint pain and myalgias.  Skin: Negative for rash.       No lesions  Neurological: Negative for speech change, focal weakness and weakness.  Endo/Heme/Allergies: Does not bruise/bleed easily.       No temperature intolerance  Psychiatric/Behavioral: Negative for depression and suicidal ideas.    Blood pressure 129/72, pulse (!) 57, temperature 97.9 F (36.6 C), temperature source Oral, resp. rate 16, height 5\' 5"  (1.651 m), weight 81.7 kg, SpO2 96 %. Physical Exam  Vitals reviewed. Constitutional: She is oriented to person, place, and time. She appears well-developed and well-nourished. No distress.  HENT:  Head: Normocephalic and atraumatic.  Mouth/Throat: Oropharynx is clear and moist.  Eyes: Pupils are equal, round, and reactive to light. Conjunctivae and EOM are normal. No scleral icterus.  Neck: Normal range of motion. Neck supple. No JVD present. No tracheal deviation present. No thyromegaly present.  Cardiovascular: Normal rate, regular rhythm and normal heart sounds. Exam reveals no gallop and no friction rub.  No murmur heard. Respiratory: Effort normal and breath sounds normal.  GI: Soft. Bowel sounds are normal. She exhibits no distension. There is no abdominal tenderness.  Genitourinary:    Genitourinary Comments: Deferred   Musculoskeletal: Normal range of motion.        General: Edema present.  Lymphadenopathy:    She has no cervical adenopathy.  Neurological: She is alert and oriented to person, place, and time. No cranial nerve deficit. She exhibits normal muscle tone.  Skin: Skin is warm and dry. No rash noted. No erythema.  Psychiatric: She has a normal mood and affect. Her behavior is normal. Judgment and thought content normal.     Assessment/Plan This is an  83 year old female admitted for new onset CHF. 1.  CHF: Acute; likely diastolic.  The patient has had excellent response to Lasix.  She is feeling better and she states that her pants are fitting better.  Edema of legs and abdomen has improved.  She still has edema of her ankles.  Continue diuresis.  Monitor I's and O's.  Obtain echocardiogram. 2.  Bradycardia: Improving.  Monitor telemetry.  Consult cardiology. 3.  Atrial fibrillation: Rate controlled.  I have held her diltiazem as the patient takes 2 doses (1 in the morning and a lower dose in the evening).  I suspect that she  has some stacking or overlap of medication due to age-related decline in GFR.  Also, INR is supratherapeutic.  Hold warfarin to allow downward trend of INR.  Defer to cardiology for medication management. 4.  Hypertension: Controlled; continue losartan and Cardura 5.  DVT prophylaxis: SCDs for now 6.  GI prophylaxis: None The patient is a full code.  Time spent on admission orders and patient care approximately 45 minutes  Arnaldo Natal, MD 08/25/2018, 3:41 AM

## 2018-08-25 NOTE — ED Notes (Signed)
PT in with co shob since Sunday states went to pmd yesterday and was told her INR was elevated so they took her off of coumadin that she takes for afib. Pt states she has also had increased pedal edema, pmd only new orders were to discontinue coumadin and follow up with cardio. Pt came in tonight for increasing shob and feeling chest and abd tightness.

## 2018-08-25 NOTE — Progress Notes (Signed)
Advanced care plan. Purpose of the Encounter: CODE STATUS Parties in Attendance: Patient Patient's Decision Capacity: Good Subjective/Patient's story: Presented to emergency room for shortness of breath Objective/Medical story Patient has pleural effusion and fluid overload probably secondary to heart failure Also has elevated INR Goals of care determination:  Needs diuresis with Lasix, echocardiogram and cardiology evaluation Advance care directives goals of care treatment plan discussed For now patient wants everything done which includes CPR, intubation ventilator CODE STATUS: Full code Time spent discussing advanced care planning: 16 minutes

## 2018-08-26 ENCOUNTER — Other Ambulatory Visit: Payer: Self-pay | Admitting: Cardiovascular Disease

## 2018-08-26 ENCOUNTER — Ambulatory Visit: Payer: Medicare Other

## 2018-08-26 DIAGNOSIS — R001 Bradycardia, unspecified: Secondary | ICD-10-CM | POA: Diagnosis not present

## 2018-08-26 DIAGNOSIS — J90 Pleural effusion, not elsewhere classified: Secondary | ICD-10-CM

## 2018-08-26 DIAGNOSIS — I110 Hypertensive heart disease with heart failure: Secondary | ICD-10-CM | POA: Diagnosis not present

## 2018-08-26 DIAGNOSIS — R2681 Unsteadiness on feet: Secondary | ICD-10-CM

## 2018-08-26 DIAGNOSIS — I5031 Acute diastolic (congestive) heart failure: Secondary | ICD-10-CM | POA: Diagnosis not present

## 2018-08-26 DIAGNOSIS — I4821 Permanent atrial fibrillation: Secondary | ICD-10-CM | POA: Diagnosis not present

## 2018-08-26 LAB — BASIC METABOLIC PANEL
Anion gap: 9 (ref 5–15)
BUN: 23 mg/dL (ref 8–23)
CALCIUM: 10.1 mg/dL (ref 8.9–10.3)
CO2: 30 mmol/L (ref 22–32)
Chloride: 102 mmol/L (ref 98–111)
Creatinine, Ser: 1.15 mg/dL — ABNORMAL HIGH (ref 0.44–1.00)
GFR calc Af Amer: 52 mL/min — ABNORMAL LOW (ref >60)
GFR calc non Af Amer: 45 mL/min — ABNORMAL LOW (ref >60)
Glucose, Bld: 78 mg/dL (ref 70–99)
Potassium: 3.7 mmol/L (ref 3.5–5.1)
Sodium: 141 mmol/L (ref 135–145)

## 2018-08-26 LAB — PROTIME-INR
INR: 4.5 (ref 0.8–1.2)
PROTHROMBIN TIME: 41.8 s — AB (ref 11.4–15.2)

## 2018-08-26 MED ORDER — DILTIAZEM HCL ER COATED BEADS 120 MG PO CP24: 120.0000 mg | ORAL_CAPSULE | Freq: Every evening | ORAL | Status: DC

## 2018-08-26 MED ORDER — LOSARTAN POTASSIUM 50 MG PO TABS: 50.0000 mg | ORAL_TABLET | Freq: Every day | ORAL | 0 refills | Status: DC

## 2018-08-26 MED ORDER — DOXAZOSIN MESYLATE 4 MG PO TABS: 4.0000 mg | ORAL_TABLET | Freq: Two times a day (BID) | ORAL | 0 refills | Status: DC

## 2018-08-26 MED ORDER — POTASSIUM CHLORIDE CRYS ER 20 MEQ PO TBCR
20.0000 meq | EXTENDED_RELEASE_TABLET | Freq: Two times a day (BID) | ORAL | Status: DC
  Administered 2018-08-26: 20 meq via ORAL
  Filled 2018-08-26: qty 1

## 2018-08-26 MED ORDER — FUROSEMIDE 20 MG PO TABS: 20.0000 mg | ORAL_TABLET | Freq: Every day | ORAL | 1 refills | Status: DC

## 2018-08-26 MED ORDER — DOXAZOSIN MESYLATE 4 MG PO TABS
4.0000 mg | ORAL_TABLET | Freq: Two times a day (BID) | ORAL | Status: DC
  Filled 2018-08-26: qty 1

## 2018-08-26 MED ORDER — POTASSIUM CHLORIDE CRYS ER 10 MEQ PO TBCR: 10.0000 meq | EXTENDED_RELEASE_TABLET | Freq: Once | ORAL | Status: DC

## 2018-08-26 MED ORDER — POTASSIUM CHLORIDE ER 10 MEQ PO TBCR: 10.0000 meq | EXTENDED_RELEASE_TABLET | Freq: Every day | ORAL | 0 refills | Status: DC

## 2018-08-26 NOTE — Progress Notes (Signed)
Clinical Child psychotherapist (CSW) received verbal consult from RN that patient's [name] had a question for CSW. CSW contacted patient's daughter. Per daughter patient will be going home today and she wants to remain independent in her home as long as possible and asked for senior resources to help her accomplish that goal. CSW provided Linda Johnson with contact information for PACE and [facility]. Linda Johnson thanked CSW for assistance. Please reconsult if future social work needs arise. CSW signing off.   [employer], LCSW [phone]

## 2018-08-26 NOTE — Care Management Note (Addendum)
Case Management Note  Patient Details  Name: Linda Johnson MRN: [id number] Date of Birth: Mar 04, 1936  Subjective/Objective:       Dc today to home.  Daughter, Marcelino Duster is at bedside.  She will be transporting patient home and will be the contact person for [facility] services.  Barbara Cower with [facility] is aware of DC today.  Patient uses a walker at home and does not have any equipment needs.    Obtains medications at [facility] without difficulty.  Patient is current with PCP.             Action/Plan:   Expected Discharge Date:  08/26/18               Expected Discharge Plan:  Home w Home Health Services  In-House Referral:     Discharge planning Services  CM Consult  Post Acute Care Choice:  Home Health Choice offered to:  Patient  DME Arranged:    DME Agency:     HH Arranged:  RN, PT, OT, Nurse's Aide, Social Work(HF clinic) [facility] Agency:  [facility]  Status of Service:  Completed, signed off  If discussed at [employer] of Stay Meetings, dates discussed:    Additional Comments:  Sherren Kerns, RN 08/26/2018, 1:06 PM

## 2018-08-26 NOTE — Progress Notes (Signed)
Progress Note  Patient Name: Linda Johnson Date of Encounter: 08/26/2018  Primary Cardiologist: Dr. Mariah Milling  Subjective   Patient denies chest pain and symptoms of pre-syncope. She stated SOB is improving. She is eager for discharge home today and will be joined by her daughter (currently at bedside). She is agreeable to transition from Warfarin to Eliquis 5 mg twice daily.   Inpatient Medications    Scheduled Meds: . calcium-vitamin D  1 tablet Oral BID  . cholecalciferol  2,000 Units Oral BID  . docusate sodium  100 mg Oral BID  . doxazosin  8 mg Oral BID  . furosemide  20 mg Oral Daily  . gabapentin  200 mg Oral TID  . latanoprost  1 drop Both Eyes QHS  . losartan  100 mg Oral Daily  . potassium chloride  20 mEq Oral BID   Followed by  . [START ON 08/27/2018] potassium chloride  10 mEq Oral Once  . prednisoLONE acetate  1 drop Both Eyes QHS   Continuous Infusions:  PRN Meds: acetaminophen **OR** acetaminophen, ondansetron **OR** ondansetron (ZOFRAN) IV   Vital Signs    Vitals:   08/25/18 2012 08/25/18 2110 08/26/18 0407 08/26/18 0811  BP: (!) 130/57 132/73 (!) 119/59 108/61  Pulse: 82 76 77 (!) 140  Resp: Temp:  98.5 F (36.9 C) 98.6 F (37 C) 98.4 F (36.9 C)  TempSrc:  Oral Oral   SpO2: 95% 94% 92% 92%  Weight:  79.5 kg 80.1 kg   Height:   (1.651 m)      Intake/Output Summary (Last 24 hours) at 08/26/2018 1110 Last data filed at 08/26/2018 0413 Gross per 24 hour  Intake -  Output 250 ml  Net -250 ml    NOTE: Urine output has not been reported in flowsheets.  Per review of ED notes, patient had documented urine output of 1200 mL at 08/25/2018 at 12:30 PM and 800 mL 08/25/2018 at 5:36 PM for total of 2L in addition to the above.   Filed Weights   08/24/18 2201 08/25/18 2110 08/26/18 0407  Weight: 81.7 kg 79.5 kg 80.1 kg    Telemetry    Permanent atrial fibrillation with PVCs versus aberrancy with ventricular rates 80s to low 90s-  Personally Reviewed  ECG    No new tracings- Personally Reviewed  Physical Exam   GEN: No acute distress.  Joined by her daughter Neck:  JVD difficult to assess due to body habitus Cardiac:  IRIR, no murmurs, rubs, or gallops.  Respiratory:  Reduced breath sounds at right lower base GI: Soft, nontender, non-distended  MS: No bilateral lower remedy edema; No deformity. Neuro:  Nonfocal  Psych: Normal affect   Labs    Chemistry Recent Labs  Lab 08/23/18 1319 08/24/18 2209 08/25/18 1636 08/26/18 0524  NA 142 140  --  141  K 4.6 3.9 3.9 3.7  CL 103 104  --  102  CO2 32 26  --  30  GLUCOSE 106* 129*  --  78  BUN 20 23  --  23  CREATININE 1.19 1.00  --  1.15*  CALCIUM 10.4 9.9  --  10.1  PROT 7.5 7.7  --   --   ALBUMIN 4.2 4.3  --   --   AST 19 29  --   --   ALT 22 30  --   --   ALKPHOS 68 67  --   --  BILITOT 0.6 0.6  --   --   GFRNONAA  --  53*  --  45*  GFRAA  --  >60  --  52*  ANIONGAP  --  10  --  9     Hematology Recent Labs  Lab 08/23/18 1319 08/24/18 2209  WBC 6.3 4.6  RBC 4.33 4.48  HGB 13.4 13.6  HCT 39.3 41.3  MCV 90.8 92.2  MCH  --  30.4  MCHC 34.1 32.9  RDW 14.0 13.9  PLT 144.0* 141*    Cardiac Enzymes Recent Labs  Lab 08/24/18 2209  TROPONINI <0.03   No results for input(s): TROPIPOC in the last 168 hours.   BNP Recent Labs  Lab 08/23/18 1319 08/24/18 2209  BNP  --  165.0*  PROBNP 246.0*  --      DDimer No results for input(s): DDIMER in the last 168 hours.   Radiology    Dg Chest 2 View  Result Date: 08/24/2018 CLINICAL DATA:  Shortness of breath EXAM: CHEST - 2 VIEW COMPARISON:  None. FINDINGS: Cardiomegaly. Small right pleural effusion. Bibasilar atelectasis or infiltrates, right greater than left. No edema or acute bony abnormality. IMPRESSION: Cardiomegaly. Small right pleural effusion with bibasilar atelectasis or infiltrates, right greater than left. Electronically Signed   By: Charlett Nose M.D.   On: 08/24/2018 22:34     Ct Chest W Contrast  Result Date: 08/25/2018 CLINICAL DATA:  Shortness of breath and bilateral upper abdominal pain. EXAM: CT CHEST, ABDOMEN, AND PELVIS WITH CONTRAST TECHNIQUE: Multidetector CT imaging of the chest, abdomen and pelvis was performed following the standard protocol during bolus administration of intravenous contrast. CONTRAST:  OMNIPAQUE IOHEXOL 300 MG/ML  SOLN COMPARISON:  None. FINDINGS: CT CHEST FINDINGS Cardiovascular: Cardiac enlargement. No pericardial effusions. Normal caliber thoracic aorta. No dissection. Aortic calcification. Coronary artery calcification. Mediastinum/Nodes: Esophagus is decompressed. Scattered lymph nodes in the mediastinum are not pathologically enlarged. Lungs/Pleura: Small right pleural effusion. Mild basilar atelectasis. Left lung is clear. Airways are patent. No pneumothorax. Musculoskeletal: Degenerative changes in the thoracic spine. Complete compression of the T4 vertebra. Old right rib fractures. CT ABDOMEN PELVIS FINDINGS Hepatobiliary: 2 cysts in the liver, largest in the left lobe measuring 2.5 cm diameter. Cholelithiasis with multiple stones in the gallbladder. No wall thickening or inflammatory changes. No bile duct dilatation. Pancreas: Unremarkable. No pancreatic ductal dilatation or surrounding inflammatory changes. Spleen: Normal in size without focal abnormality. Adrenals/Urinary Tract: No adrenal gland nodules. Cyst in the lower pole left kidney. No hydronephrosis or hydroureter. Nephrograms are symmetrical. Bladder is unremarkable. Stomach/Bowel: Stomach, small bowel, and colon are not abnormally distended. Stool fills the colon. No wall thickening or inflammatory changes. Diverticulosis of the sigmoid colon without evidence of diverticulitis. Appendix is normal. Vascular/Lymphatic: Aortic atherosclerosis. No enlarged abdominal or pelvic lymph nodes. Reproductive: Uterus and bilateral adnexa are unremarkable. Other: No abdominal wall hernia  or abnormality. No abdominopelvic ascites. Musculoskeletal: Degenerative changes in the spine. No destructive bone lesions. Mild spondylolisthesis at L5-S1. IMPRESSION: 1. Small right pleural effusion with basilar atelectasis. 2. Benign-appearing hepatic and renal cysts. 3. Cholelithiasis without evidence of cholecystitis. 4. Diverticulosis of the colon without evidence of diverticulitis. Aortic Atherosclerosis (ICD10-I70.0). Electronically Signed   By: Burman Nieves M.D.   On: 08/25/2018 01:26   Ct Abdomen Pelvis W Contrast  Result Date: 08/25/2018 CLINICAL DATA:  Shortness of breath and bilateral upper abdominal pain. EXAM: CT CHEST, ABDOMEN, AND PELVIS WITH CONTRAST TECHNIQUE: Multidetector CT imaging of the chest, abdomen  and pelvis was performed following the standard protocol during bolus administration of intravenous contrast. CONTRAST:  100mL OMNIPAQUE IOHEXOL 300 MG/ML  SOLN COMPARISON:  None. FINDINGS: CT CHEST FINDINGS Cardiovascular: Cardiac enlargement. No pericardial effusions. Normal caliber thoracic aorta. No dissection. Aortic calcification. Coronary artery calcification. Mediastinum/Nodes: Esophagus is decompressed. Scattered lymph nodes in the mediastinum are not pathologically enlarged. Lungs/Pleura: Small right pleural effusion. Mild basilar atelectasis. Left lung is clear. Airways are patent. No pneumothorax. Musculoskeletal: Degenerative changes in the thoracic spine. Complete compression of the T4 vertebra. Old right rib fractures. CT ABDOMEN PELVIS FINDINGS Hepatobiliary: 2 cysts in the liver, largest in the left lobe measuring 2.5 cm diameter. Cholelithiasis with multiple stones in the gallbladder. No wall thickening or inflammatory changes. No bile duct dilatation. Pancreas: Unremarkable. No pancreatic ductal dilatation or surrounding inflammatory changes. Spleen: Normal in size without focal abnormality. Adrenals/Urinary Tract: No adrenal gland nodules. Cyst in the lower pole left  kidney. No hydronephrosis or hydroureter. Nephrograms are symmetrical. Bladder is unremarkable. Stomach/Bowel: Stomach, small bowel, and colon are not abnormally distended. Stool fills the colon. No wall thickening or inflammatory changes. Diverticulosis of the sigmoid colon without evidence of diverticulitis. Appendix is normal. Vascular/Lymphatic: Aortic atherosclerosis. No enlarged abdominal or pelvic lymph nodes. Reproductive: Uterus and bilateral adnexa are unremarkable. Other: No abdominal wall hernia or abnormality. No abdominopelvic ascites. Musculoskeletal: Degenerative changes in the spine. No destructive bone lesions. Mild spondylolisthesis at L5-S1. IMPRESSION: 1. Small right pleural effusion with basilar atelectasis. 2. Benign-appearing hepatic and renal cysts. 3. Cholelithiasis without evidence of cholecystitis. 4. Diverticulosis of the colon without evidence of diverticulitis. Aortic Atherosclerosis (ICD10-I70.0). Electronically Signed   By: Burman NievesWilliam  Stevens M.D.   On: 08/25/2018 01:26    Cardiac Studies   Pending official reading echo 2/26  10/08/2015 - Left ventricle: The cavity size was normal. Systolic function was normal. The estimated ejection fraction was in the range of 60% to 65%. Wall motion was normal; there were no regional wall motion abnormalities. The study is not technically sufficient to allow evaluation of LV diastolic function. - Aortic valve: There was trivial regurgitation. - Left atrium: The atrium was normal in size. - Right ventricle: Systolic function was normal. - Pulmonary arteries: Systolic pressure was within the normal range.  Impressions:  - Rhythm is atrial fibrillation.   Patient Profile     83 y.o. female with a hx of chronic atrial fibrillation on anticoagulation, HTN, hyperlipidemia, hypothyroidism, obesity, chronic back and leg pain, and osteoporosis who is being seen today for the evaluation of mild bradycardic ventricular  rates.  Assessment & Plan    Bradycardia  -Consulted for mild bradycardic ventricular rates in the setting of atrial fibrillation with PVCs versus aberrancy.  At discharge, decreased diltiazem down to 120 mg daily and Cardura down to 4 mg twice daily.  Acute respiratory distress, consistent with acute HFpEF - Likely secondary to poor diet, high sodium intake, increased fluid intake.  Right pleural effusion.  Symptoms consistent with acute diastolic CHF and pending echocardiogram as above.  Started on IV Lasix and diuresed with net goal of at least -2 L achieved.  Discharge on Lasix 20 mg daily and KCl 10mEq daily -start date of 2/28.  Follow-up BMET in the office / at her nursing check 08/30/2018.  Patient informed of how to adjust lasix based on volume status / intake / sx / daily weights and told to call cardiology office if SOB or volume returns like before this most pervious admission.  Permanent atrial fibrillation on warfarin  - No plan for DCCV.  Ventricular rates of 80-90s and escalation of further rate control medication limited by soft blood pressures. Decreasing diltiazem to 120 mg daily.  She has stopped warfarin given supratherapeutic at admission.  Message sent to the office to reschedule existing appointment for tomorrow to a nursing appointment for Monday 3/2 at which time plan to check the patient's INR, and if at goal, transition her to Eliquis 5 mg twice daily with coupon and samples provided.    Hypertension - Continue current antihypertensives including Cardura, losartan.  Blood pressure at times soft. Monitor.  Patient will need to continue to monitor blood pressure at home given her history of fall and now she is on anticoagulation and still supratherapeutic.  Gait instability with previous falls - As indicated in previous documentation, long discussion with patient regarding concern that she is on anticoagulation with supratherapeutic levels and history of unsteady gait and  falls.  She continues to ambulate with a walker and drive herself places.  She was advised to no longer drive on her own.  She was also advised to obtain a plan/life alert button in the event that she falls.  Discussed with IM, and home health orders to be placed at discharge.  For questions or updates, please contact [facility] Please consult [email or web address] for contact info under        Signed, Lennon Alstrom, PA-C  08/26/2018, 11:10 AM

## 2018-08-26 NOTE — Progress Notes (Addendum)
Cardiovascular and Pulmonary Nurse Navigator Note:    83 year old female with PMHx of atrial fibrillation and HTN who presented to the ED c/o SOB, swelling of her feet, legs and abdomen.  BNP 165.  Patient admitted with new onset of CHF.   Patient is on Coumadin for A-fib. CXR on admission:   IMPRESSION: Cardiomegaly. Small right pleural effusion with bibasilar atelectasis or infiltrates, right greater than left. ? CHF Education:? Patient lying in bed with HOB elevated.  Daughter at bedside.  Educational session with patient / daughter completed. Provided patient with "Living Better with Heart Failure" packet. Briefly reviewed definition of heart failure and signs and symptoms of an exacerbation.?Explained to patient that HF is a chronic illness which requires self-assessment / self-management along with help from the cardiologist/PCP.?? ? *Reviewed importance of and reason behind checking weight daily in the AM, after using the bathroom, but before getting dressed. Patient will purchase scale.   ? *Reviewed with patient the following information: *Discussed when to call the Dr= weight gain of >2-3lb overnight of 5lb in a week,  *Discussed yellow zone= call MD: weight gain of >2-3lb overnight of 5lb in a week, increased swelling, increased SOB when lying down, chest discomfort, dizziness, increased fatigue *Red Zone= call 911: struggle to breath, fainting or near fainting, significant chest pain   *Zone Magnet given to patient.    *Reviewed low sodium diet-provided ADA handouts of recommended and not recommended foods, sample menu, heart failure nutritional therapy, and list of sodium content of foods.? ? *Diet - Dietitian Consultation for diet education has been ordered, but not completed.   ? *Discussed fluid intake with patient as well. Patient and daughter reporting MD has instructed patient no more than 1500 ml of fluid per day.  ? *Instructed patient to take medications as prescribed  for heart failure. Explained briefly why pt is on the medications (either make you feel better, live longer or keep you out of the hospital) and discussed monitoring and side effects.  ? *Discussed exercise / activity.  Patient and daughter both agree that patient has trouble walking.   Patient uses walker. Patient and daughter are interested in [facility] PT.  Daughter is also interested in enrolling patient in PACE program.  SW spoke with daughter on the phone about this.  Patient would need to complete an application with them.  RNCM informed of interest and need for [facility].  RNCM to see patient.     *Daughter plans on getting patient Life Alert.  Daughter stated patient does not want to live with either of her daughters, but wants to live alone.  One daughter lives here locally and the other one lives in [place].   ? *Smoking Cessation- Patient is a NEVER smoker.? ? *[facility] - Explained the purpose of the HF Clinic. ?Explained to patient the HF Clinic does not replace PCP nor Cardiologist, but is an additional resource to helping patient manage heart failure at home.  Patient has appointment scheduled for 09/10/2018 at 1:20 p.m.    Patient following up with Dr. Mariah Milling on 08/31/2018 at 9:30 a.m.  and her PCP in one week.   ? Patient / daughter thanked me for providing the above information. ? ? Army Melia, RN, BSN, [facility]? [facility]  [facility] Cardiac &?Pulmonary Rehab  Cardiovascular &?Pulmonary Nurse Navigator  Direct Line: [phone]  Department Phone #: [phone] Fax: [phone]? Email Address: [email or web address]

## 2018-08-26 NOTE — Progress Notes (Signed)
[facility]  Emergency contact: [phone] [name]- daughter  Cardiologist: Mariah Milling   If patient discharges after hours, please call [phone].   Linda Johnson 08/26/2018, 1:03 PM

## 2018-08-26 NOTE — Discharge Summary (Signed)
[employer] - [place] at [facility]   PATIENT NAME: Linda Johnson    MR#:  [id number]  DATE OF BIRTH:  1936/05/08  DATE OF ADMISSION:  08/25/2018 ADMITTING PHYSICIAN: Arnaldo Natal, MD  DATE OF DISCHARGE: 08/26/2018  PRIMARY CARE PHYSICIAN: Tower, Audrie Gallus, MD   ADMISSION DIAGNOSIS:  Cardiomegaly [I51.7] Pleural effusion [J90] Dyspnea, unspecified type [R06.00]  DISCHARGE DIAGNOSIS:  Active Problems:   Bradycardia Chronic atrial fibrillation Acute diastolic heart failure exacerbation Hypertension  SECONDARY DIAGNOSIS:   Past Medical History:  Diagnosis Date  . Atrial fibrillation (HCC)   . Hypertension      ADMITTING HISTORY The patient with past medical history of atrial fibrillation hypertension presents to the emergency department due to shortness of breath.  The patient has been communicating with her cardiology nurses this week regarding her Coumadin therapy.  She is supratherapeutic with an INR of 7.  She communicated with nursing/case management that she was intermittently short of breath.  She was instructed to come to the emergency department if her dyspnea worsened.  The patient noticed throughout the day that her feet were swelling and that the waistband of her pants was digging into her skin.  Her shortness of breath also became acute which prompted evaluation in the emergency department where she was found to have pulmonary vascular congestion on chest x-ray.  The patient received Lasix IV in the emergency department.  She does not carry a diagnosis of CHF.  She was also found to have bradycardia as well which is a new problem for her which prompted the emergency department staff to call the hospitalist service for admission.  HOSPITAL COURSE:  Patient was admitted to telemetry.  Patient was diuresed with IV Lasix and she responded well.  Her Cardizem was held for bradycardia.  Patient was evaluated by [facility] cardiology during the  hospitalization.  Coumadin was also held as INR was supratherapeutic.  INR trended down.  The time of discharge INR is 4.5.  Cardiology do not want to continue Coumadin.  We will see the patient in the clinic and transition the patient onto Eliquis.  She was worked up with echocardiogram which shows EF of 55 to 60%.  Bradycardia has resolved. Patient will be discharged home on decreased dose of Cardizem, decrease dose of losartan, Lasix will be added for diuresis.  CONSULTS OBTAINED:  Treatment Team:  Antonieta Iba, MD  DRUG ALLERGIES:   Allergies  Allergen Reactions  . Ace Inhibitors     REACTION: cough  . Hydrocodone-Homatropine Nausea Only  . Tramadol Hcl     nausea    DISCHARGE MEDICATIONS:   Allergies as of 08/26/2018      Reactions   Ace Inhibitors    REACTION: cough   Hydrocodone-homatropine Nausea Only   Tramadol Hcl    nausea      Medication List    STOP taking these medications   warfarin 2.5 MG tablet Commonly known as:  COUMADIN     TAKE these medications   calcium-vitamin D 500-200 MG-UNIT tablet Commonly known as:  OSCAL WITH D Take 1 tablet by mouth 2 (two) times daily.   diltiazem 120 MG 24 hr capsule Commonly known as:  CARDIZEM CD TAKE 1 CAPSULE (120 MG TOTAL) BY MOUTH DAILY. What changed:  Another medication with the same name was removed. Continue taking this medication, and follow the directions you see here.   doxazosin 4 MG tablet Commonly known as:  CARDURA Take 1 tablet (  4 mg total) by mouth 2 (two) times daily for 30 days. What changed:    medication strength  how much to take   furosemide 20 MG tablet Commonly known as:  LASIX Take 1 tablet (20 mg total) by mouth daily for 30 days. Start taking on:  August 27, 2018   gabapentin 100 MG capsule Commonly known as:  NEURONTIN TAKE 2 CAPSULES (200 MG TOTAL) BY MOUTH 3 (THREE) TIMES DAILY.   HAIR SKIN AND NAILS FORMULA Tabs Take 1 tablet by mouth daily.   latanoprost 0.005 %  ophthalmic solution Commonly known as:  XALATAN INSTILL ONE DROP IN BOTH EYES AT BEDTIME.   losartan 50 MG tablet Commonly known as:  COZAAR Take 1 tablet (50 mg total) by mouth daily for 30 days. What changed:    medication strength  how much to take   potassium chloride 10 MEQ tablet Commonly known as:  KLOR-CON 10 Take 1 tablet (10 mEq total) by mouth daily for 30 days.   prednisoLONE acetate 1 % ophthalmic suspension Commonly known as:  PRED FORTE Place 1 drop into both eyes at bedtime.   Vitamin D3 50 MCG (2000 UT) Tabs Take 1 tablet by mouth 2 (two) times daily.       Today  Patient seen today No chest pain No palpitations No shortness of breath  VITAL SIGNS:  Blood pressure 108 / 68 mmHg, PR 82/min, Temp 98.8 F, Sat 96% on room air  I/O:    Intake/Output Summary (Last 24 hours) at 08/26/2018 1326 Last data filed at 08/26/2018 0800 Gross per 24 hour  Intake -  Output 300 ml  Net -300 ml    PHYSICAL EXAMINATION:  Physical Exam  GENERAL:  83 y.o.-year-old patient lying in the bed with no acute distress.  LUNGS: Normal breath sounds bilaterally, no wheezing, rales,rhonchi or crepitation. No use of accessory muscles of respiration.  CARDIOVASCULAR: S1, S2 irregular. No murmurs, rubs, or gallops.  ABDOMEN: Soft, non-tender, non-distended. Bowel sounds present. No organomegaly or mass.  NEUROLOGIC: Moves all 4 extremities. PSYCHIATRIC: The patient is alert and oriented x 3.  SKIN: No obvious rash, lesion, or ulcer.   DATA REVIEW:   CBC Recent Labs  Lab 08/24/18 2209  WBC 4.6  HGB 13.6  HCT 41.3  PLT 141*    Chemistries  Recent Labs  Lab 08/24/18 2209 08/25/18 1636 08/26/18 0524  NA 140  --  141  K 3.9 3.9 3.7  CL 104  --  102  CO2 26  --  30  GLUCOSE 129*  --  78  BUN 23  --  23  CREATININE 1.00  --  1.15*  CALCIUM 9.9  --  10.1  MG  --  1.9  --   AST 29  --   --   ALT 30  --   --   ALKPHOS 67  --   --   BILITOT 0.6  --   --      Cardiac Enzymes Recent Labs  Lab 08/24/18 2209  TROPONINI <0.03    Microbiology Results  No results found for this or any previous visit.  RADIOLOGY:  Dg Chest 2 View  Result Date: 08/24/2018 CLINICAL DATA:  Shortness of breath EXAM: CHEST - 2 VIEW COMPARISON:  None. FINDINGS: Cardiomegaly. Small right pleural effusion. Bibasilar atelectasis or infiltrates, right greater than left. No edema or acute bony abnormality. IMPRESSION: Cardiomegaly. Small right pleural effusion with bibasilar atelectasis or infiltrates, right greater than left.  Electronically Signed   By: Charlett Nose M.D.   On: 08/24/2018 22:34   Ct Chest W Contrast  Result Date: 08/25/2018 CLINICAL DATA:  Shortness of breath and bilateral upper abdominal pain. EXAM: CT CHEST, ABDOMEN, AND PELVIS WITH CONTRAST TECHNIQUE: Multidetector CT imaging of the chest, abdomen and pelvis was performed following the standard protocol during bolus administration of intravenous contrast. CONTRAST:  OMNIPAQUE IOHEXOL 300 MG/ML  SOLN COMPARISON:  None. FINDINGS: CT CHEST FINDINGS Cardiovascular: Cardiac enlargement. No pericardial effusions. Normal caliber thoracic aorta. No dissection. Aortic calcification. Coronary artery calcification. Mediastinum/Nodes: Esophagus is decompressed. Scattered lymph nodes in the mediastinum are not pathologically enlarged. Lungs/Pleura: Small right pleural effusion. Mild basilar atelectasis. Left lung is clear. Airways are patent. No pneumothorax. Musculoskeletal: Degenerative changes in the thoracic spine. Complete compression of the T4 vertebra. Old right rib fractures. CT ABDOMEN PELVIS FINDINGS Hepatobiliary: 2 cysts in the liver, largest in the left lobe measuring 2.5 cm diameter. Cholelithiasis with multiple stones in the gallbladder. No wall thickening or inflammatory changes. No bile duct dilatation. Pancreas: Unremarkable. No pancreatic ductal dilatation or surrounding inflammatory changes. Spleen:  Normal in size without focal abnormality. Adrenals/Urinary Tract: No adrenal gland nodules. Cyst in the lower pole left kidney. No hydronephrosis or hydroureter. Nephrograms are symmetrical. Bladder is unremarkable. Stomach/Bowel: Stomach, small bowel, and colon are not abnormally distended. Stool fills the colon. No wall thickening or inflammatory changes. Diverticulosis of the sigmoid colon without evidence of diverticulitis. Appendix is normal. Vascular/Lymphatic: Aortic atherosclerosis. No enlarged abdominal or pelvic lymph nodes. Reproductive: Uterus and bilateral adnexa are unremarkable. Other: No abdominal wall hernia or abnormality. No abdominopelvic ascites. Musculoskeletal: Degenerative changes in the spine. No destructive bone lesions. Mild spondylolisthesis at L5-S1. IMPRESSION: 1. Small right pleural effusion with basilar atelectasis. 2. Benign-appearing hepatic and renal cysts. 3. Cholelithiasis without evidence of cholecystitis. 4. Diverticulosis of the colon without evidence of diverticulitis. Aortic Atherosclerosis (ICD10-I70.0). Electronically Signed   By: Burman Nieves M.D.   On: 08/25/2018 01:26   Ct Abdomen Pelvis W Contrast  Result Date: 08/25/2018 CLINICAL DATA:  Shortness of breath and bilateral upper abdominal pain. EXAM: CT CHEST, ABDOMEN, AND PELVIS WITH CONTRAST TECHNIQUE: Multidetector CT imaging of the chest, abdomen and pelvis was performed following the standard protocol during bolus administration of intravenous contrast. CONTRAST:  OMNIPAQUE IOHEXOL 300 MG/ML  SOLN COMPARISON:  None. FINDINGS: CT CHEST FINDINGS Cardiovascular: Cardiac enlargement. No pericardial effusions. Normal caliber thoracic aorta. No dissection. Aortic calcification. Coronary artery calcification. Mediastinum/Nodes: Esophagus is decompressed. Scattered lymph nodes in the mediastinum are not pathologically enlarged. Lungs/Pleura: Small right pleural effusion. Mild basilar atelectasis. Left lung is  clear. Airways are patent. No pneumothorax. Musculoskeletal: Degenerative changes in the thoracic spine. Complete compression of the T4 vertebra. Old right rib fractures. CT ABDOMEN PELVIS FINDINGS Hepatobiliary: 2 cysts in the liver, largest in the left lobe measuring 2.5 cm diameter. Cholelithiasis with multiple stones in the gallbladder. No wall thickening or inflammatory changes. No bile duct dilatation. Pancreas: Unremarkable. No pancreatic ductal dilatation or surrounding inflammatory changes. Spleen: Normal in size without focal abnormality. Adrenals/Urinary Tract: No adrenal gland nodules. Cyst in the lower pole left kidney. No hydronephrosis or hydroureter. Nephrograms are symmetrical. Bladder is unremarkable. Stomach/Bowel: Stomach, small bowel, and colon are not abnormally distended. Stool fills the colon. No wall thickening or inflammatory changes. Diverticulosis of the sigmoid colon without evidence of diverticulitis. Appendix is normal. Vascular/Lymphatic: Aortic atherosclerosis. No enlarged abdominal or pelvic lymph nodes. Reproductive: Uterus and bilateral adnexa  are unremarkable. Other: No abdominal wall hernia or abnormality. No abdominopelvic ascites. Musculoskeletal: Degenerative changes in the spine. No destructive bone lesions. Mild spondylolisthesis at L5-S1. IMPRESSION: 1. Small right pleural effusion with basilar atelectasis. 2. Benign-appearing hepatic and renal cysts. 3. Cholelithiasis without evidence of cholecystitis. 4. Diverticulosis of the colon without evidence of diverticulitis. Aortic Atherosclerosis (ICD10-I70.0). Electronically Signed   By: Burman Nieves M.D.   On: 08/25/2018 01:26    Follow up with PCP in 1 week.  Management plans discussed with the patient, family and they are in agreement.  CODE STATUS: Full code    Code Status Orders  (From admission, onward)         Start     Ordered   08/25/18 0418  Full code  Continuous     08/25/18 0417        Code  Status History    This patient has a current code status but no historical code status.    Advance Directive Documentation     Most Recent Value  Type of Advance Directive  Healthcare Power of Attorney  Pre-existing out of facility DNR order (yellow form or pink MOST form)  -  "MOST" Form in Place?  -      TOTAL TIME TAKING CARE OF THIS PATIENT ON DAY OF DISCHARGE: more than 36 minutes.   Ihor Austin M.D on 08/26/2018 at 1:26 PM  Between 7am to 6pm - Pager - [phone]  After 6pm go to [email or web address] - password [email or web address] [facility]  [facility] [place] [facility]  Office  [phone]  CC: Primary care physician; Tower, Audrie Gallus, MD  Note: This dictation was prepared with Dragon dictation along with smaller phrase technology. Any transcriptional errors that result from this process are unintentional.

## 2018-08-26 NOTE — Plan of Care (Signed)
Pt is being D/C home with daughter.  All vitals are stable.

## 2018-08-26 NOTE — Progress Notes (Signed)
Pt is stable.  All vitals are stable.  Daughter at bedside.  Iv's removed.

## 2018-08-26 NOTE — Plan of Care (Signed)
Care plan reviewed with pt. Denies any need since 2300.  Has uneventful night

## 2018-08-26 NOTE — Progress Notes (Signed)
Assumed pt care.

## 2018-08-26 NOTE — Progress Notes (Signed)
Cardiology Office Note  Date:  08/30/2018   ID:  REBCCA KNIPFER, DOB 1936/04/13, MRN [id number]  PCP:  Judy Pimple, MD   Chief Complaint  Patient presents with  . OTHER    F/u ED sob/cardiomegaly c/o sob and cold. Meds reviewed verbally with pt.    HPI:  Ms. Menifee is a 83 y.o. woman with a hx of  atrial fibrillation, chronic, On anticoagulation hypertension who presents for routine follow-up of her  PVCs, atrial fibrillation  INTERVAL HISTORY: The patient reports today for follow up. She is doing well overall, but recently has a cold. She is still using a walker. She is accompanied by her oldest daughter.  Youngest daughter in  called in on the phone during our visit   Recent hospitalization, sent from primary care for INR of 7,  Reported worsening shortness of breath Treated for chronic diastolic CHF IV Lasix x2 doses Discharged on Lasix 20 daily Hospital records reviewed with the patient in detail At discharge was told to decrease her diltiazem down to 120 mg daily given bradycardia.  Warfarin was held in an effort to transition her to Eliquis  Since discharge from the hospital she has been very strict with her oral intake, she endorses weight loss, 5 pounds in the past 2 days. She is taking a dose dose of lasix which she started on Friday 08/27/18, but feels like it has not started working.  On Saturday 08/28/18 her weight was 173 lb and this morning it was 168.4 lb. Her heart rate has been higher recently, usually around 100  Daughter endorses she has shortness of breath. Daughter believes it may be attributed to the cold/upper respiratory infection. denies doing exercise or regularly walking.   Blood pressure 134/86 Total Chol 126/ LDL 56 CR 1.15 Glucose 78  INR today 2.3  EKG personally reviewed by myself on todays visit Shows atrial fibrillation rhythm. 100 bpm. Incomplete right bundle branch block. Abnormal ECG   OTHER PAST MEDICAL HISTORY REVIEWED BY ME  FOR TODAY'S VISIT: On a previous clinic visit, she was started on amiodarone in an attempt to cardiovert In follow-up she Declined cardioversion  Previously felt she had nausea from the low-dose amiodarone  Atrial fibrillation picked up by primary care on routine EKG early 2017   PMH:   has a past medical history of Atrial fibrillation (HCC) and Hypertension.  PSH:    Past Surgical History:  Procedure Laterality Date  . BREAST BIOPSY Left 2011   2 areas, cysts per pt    Current Outpatient Medications  Medication Sig Dispense Refill  . calcium-vitamin D (OSCAL WITH D) 500-200 MG-UNIT per tablet Take 1 tablet by mouth 2 (two) times daily.    . Cholecalciferol (VITAMIN D3) 2000 UNITS TABS Take 1 tablet by mouth 2 (two) times daily.     Marland Kitchen diltiazem (CARDIZEM CD) 120 MG 24 hr capsule TAKE 1 CAPSULE (120 MG TOTAL) BY MOUTH DAILY. 30 capsule 6  . doxazosin (CARDURA) 4 MG tablet Take 1 tablet (4 mg total) by mouth 2 (two) times daily for 30 days. 60 tablet 0  . furosemide (LASIX) 20 MG tablet Take 1 tablet (20 mg total) by mouth daily for 30 days. 30 tablet 1  . gabapentin (NEURONTIN) 100 MG capsule TAKE 2 CAPSULES (200 MG TOTAL) BY MOUTH 3 (THREE) TIMES DAILY. 180 capsule 5  . latanoprost (XALATAN) 0.005 % ophthalmic solution INSTILL ONE DROP IN BOTH EYES AT BEDTIME.  3  . losartan (COZAAR)  50 MG tablet Take 1 tablet (50 mg total) by mouth daily for 30 days. 30 tablet 0  . Multiple Vitamins-Minerals (HAIR SKIN AND NAILS FORMULA) TABS Take 1 tablet by mouth daily.    . potassium chloride (KLOR-CON 10) 10 MEQ tablet Take 1 tablet (10 mEq total) by mouth daily for 30 days. 30 tablet 0  . prednisoLONE acetate (PRED FORTE) 1 % ophthalmic suspension Place 1 drop into both eyes at bedtime.     No current facility-administered medications for this visit.      Allergies:   Ace inhibitors; Hydrocodone-homatropine; and Tramadol hcl   Social History:  The patient  reports that she has never  smoked. She has never used smokeless tobacco. She reports that she does not drink alcohol or use drugs.   Family History:   family history includes Breast cancer in her sister; Breast cancer (age of onset: 28) in her maternal aunt; Leukemia in her sister.    Review of Systems: Review of Systems  Constitutional: Positive for weight loss.  Eyes: Negative.   Respiratory: Positive for shortness of breath.   Cardiovascular: Negative.   Gastrointestinal: Negative.   Genitourinary: Negative.   Musculoskeletal: Negative.   Neurological: Negative.   Psychiatric/Behavioral: Negative.   All other systems reviewed and are negative.    PHYSICAL EXAM: VS:  BP 134/86 (BP Location: Left Arm, Patient Position: Sitting, Cuff Size: Normal)   Pulse 100   Ht  (1.651 m)   Wt 169 lb 8 oz (76.9 kg)   SpO2 96%   BMI 28.21 kg/m  , BMI Body mass index is 28.21 kg/m.   Constitutional:  oriented to person, place, and time. No distress.  HENT:  Head: Grossly normal Eyes:  no discharge. No scleral icterus.  Neck: No JVD, no carotid bruits  Cardiovascular: Regular rate and rhythm, no murmurs appreciated Pulmonary/Chest: Clear to auscultation bilaterally, no wheezes or rales Abdominal: Soft.  no distension.  no tenderness.  Musculoskeletal: Normal range of motion Neurological:  normal muscle tone. Coordination normal. No atrophy Skin: Skin warm and dry Psychiatric: normal affect, pleasant  Recent Labs: 08/23/2018: Pro B Natriuretic peptide (BNP) 246.0 08/24/2018: ALT 30; B Natriuretic Peptide 165.0; Hemoglobin 13.6; Platelets 141; TSH 6.013 08/25/2018: Magnesium 1.9 08/26/2018: BUN 23; Creatinine, Ser 1.15; Potassium 3.7; Sodium 141    Lipid Panel Lab Results  Component Value Date   CHOL 126 10/02/2017   HDL 50.60 10/02/2017   LDLCALC 56 10/02/2017   TRIG 101.0 10/02/2017      Wt Readings from Last 3 Encounters:  08/30/18 169 lb 8 oz (76.9 kg)  08/26/18 176 lb 9.4 oz (80.1 kg)   08/23/18 180 lb 2 oz (81.7 kg)       ASSESSMENT AND PLAN:  Chronic diastolic CHF Recommend she only take Lasix 20 mg daily as needed for weight 173 pounds or higher on her home scale She has been very strict with her fluid intake since she has been home She reports when she got home from the hospital weight was slightly above 173 pounds  Persistent atrial fibrillation (HCC)  Plan: EKG 12-Lead Rate well controlled Recommend starting Eliquis 5 mg twice a day , start on March 5 once INR down to low level She stopped warfarin when she was admitted to the hospital INR was 7  Essential hypertension Plan: Reports is well controlled at home Monitor heart rate and blood pressure at home. Taking low-dose diltiazem 120 daily down from 360  Mixed hyperlipidemia Plan:  Currently not on a statin,  cholesterol very well controlled on no medication  Encounter for anticoagulation discussion and counseling Plan: Recommend starting Eliquis 5 mg twice a day. Discussed with her, she will start this later this week  Gait instability Plan: No recent falls Walks with a walker Balance issues is a chronic problem No regular exercise  PVCs Plan: asymptomatic Seen on EKG, no further workup at this time  Shortness of breath Likely secondary to atrial fibrillation, deconditioning    Total encounter time more than 25 minutes  Greater than 50% was spent in counseling and coordination of care with the patient   Disposition:   F/U  6 months   No orders of the defined types were placed in this encounter.  I, Jesus Reyes am acting as a Neurosurgeon for Julien Nordmann, M.D., Ph.D.  I, Julien Nordmann, M.D. Ph.D., have reviewed the above documentation for accuracy and completeness, and I agree with the above.   Signed, Dossie Arbour, M.D., Ph.D. 08/30/2018  [facility] [place], [place] [phone]

## 2018-08-27 ENCOUNTER — Telehealth: Payer: Self-pay

## 2018-08-27 ENCOUNTER — Ambulatory Visit: Payer: Medicare Other | Admitting: Physician Assistant

## 2018-08-27 ENCOUNTER — Telehealth: Payer: Self-pay | Admitting: Cardiovascular Disease

## 2018-08-27 NOTE — Telephone Encounter (Signed)
Misty with [facility] calling States that she is with patient and needs a medication clarification Please call to discuss at [phone] ASAP

## 2018-08-27 NOTE — Telephone Encounter (Signed)
Verbal order given to [facility]

## 2018-08-27 NOTE — Telephone Encounter (Signed)
Misty nurse with [facility] left v/m requesting verbal orders for [facility] nursing 1 x a wk for 1 wk;    2 x a wk for 2 wks    1 x a wk for 2 wks    1 x every other wk for 4 wks.

## 2018-08-27 NOTE — Telephone Encounter (Signed)
Patient's daughter advised.  

## 2018-08-27 NOTE — Telephone Encounter (Signed)
Transition Care Management Follow-up Telephone Call   Date discharged? 08/26/2018   How have you been since you were released from the hospital?  Feels good.   Do you understand why you were in the hospital? yes   Do you understand the discharge instructions? yes   Where were you discharged to? Home with one of her daughters   Items Reviewed:  Medications reviewed: yes  Allergies reviewed: yes  Dietary changes reviewed: yes  Referrals reviewed: yes- home health nurse is at her house now discussing plan of care.   Functional Questionnaire:   Activities of Daily Living (ADLs):   She states they are independent in the following: feeding ,dressing, bathing, hygiene, grooming, toileting States they require assistance with the following: ambulation-using a walker   Any transportation issues/concerns?: no   Any patient concerns? Not at this time   Confirmed importance and date/time of follow-up visits scheduled yes  Provider Appointment booked with not at this time. See note above.  Confirmed with patient if condition begins to worsen call PCP or go to the ER.  Patient was given the office number and encouraged to call back with question or concerns.  : yes

## 2018-08-27 NOTE — Telephone Encounter (Signed)
Returned call to [employer]. She was calling to verify Cardizem dose. Per d/c AVS she was to take 1 tablet (120 mg total)  Once per day.  According to script received from [facility] she was to take 1 tablet ( 240 mg) once daily. Vitals today without medication was BP 126/74, HR 82.  According to all notes in Epic she was to take 1 tablet (120 mg total) once daily. I gave verbal order to [facility].  I called Misty RN, back and confirmed dosage of 1 tablet (120 mg total) once daily. She verbalized understanding and stated that she would call daughter to report this information.   Pt scheduled to see Ward Givens, NP on Tuesday.   Routed to primary cardiologist as Lorain Childes.

## 2018-08-27 NOTE — Telephone Encounter (Signed)
Dr. Milinda Antis do you need to see patient for follow up after Hospital stay. I called to get patient scheduled but she said she did not need to see you at this time. She has an appointment with Dr. Mariah Milling on 08/30/2018 for follow up. Home Health nurse-Misty- at her house now discussing plan of care with her and her daughter. I advised patient I would check and see if she still needs to come and see you and we would call her back if so.

## 2018-08-27 NOTE — Telephone Encounter (Signed)
Please ok those verbal orders  

## 2018-08-27 NOTE — Telephone Encounter (Signed)
I think she is ok to follow up with cardiology w/o coming here unless there is another new issue  Thanks

## 2018-08-30 ENCOUNTER — Telehealth: Payer: Self-pay | Admitting: Family Medicine

## 2018-08-30 ENCOUNTER — Ambulatory Visit (INDEPENDENT_AMBULATORY_CARE_PROVIDER_SITE_OTHER): Payer: Medicare Other | Admitting: Cardiovascular Disease

## 2018-08-30 ENCOUNTER — Encounter: Payer: Self-pay | Admitting: Cardiovascular Disease

## 2018-08-30 ENCOUNTER — Ambulatory Visit: Payer: Medicare Other

## 2018-08-30 VITALS — BP 134/86 | HR 100 | Ht 65.0 in | Wt 169.5 lb

## 2018-08-30 DIAGNOSIS — I5032 Chronic diastolic (congestive) heart failure: Secondary | ICD-10-CM

## 2018-08-30 DIAGNOSIS — Z79899 Other long term (current) drug therapy: Secondary | ICD-10-CM

## 2018-08-30 DIAGNOSIS — I10 Essential (primary) hypertension: Secondary | ICD-10-CM | POA: Diagnosis not present

## 2018-08-30 DIAGNOSIS — E782 Mixed hyperlipidemia: Secondary | ICD-10-CM

## 2018-08-30 DIAGNOSIS — I482 Chronic atrial fibrillation, unspecified: Secondary | ICD-10-CM

## 2018-08-30 DIAGNOSIS — Z7189 Other specified counseling: Secondary | ICD-10-CM

## 2018-08-30 MED ORDER — FUROSEMIDE 20 MG PO TABS: ORAL_TABLET | ORAL | Status: DC

## 2018-08-30 MED ORDER — APIXABAN 5 MG PO TABS: 5.0000 mg | ORAL_TABLET | Freq: Two times a day (BID) | ORAL | 6 refills | Status: DC

## 2018-08-30 NOTE — Telephone Encounter (Signed)
Verbal orders given to Stacy 

## 2018-08-30 NOTE — Telephone Encounter (Signed)
Please ok those verbal orders  

## 2018-08-30 NOTE — Patient Instructions (Addendum)
Medication Instructions:  Do not take lasix/furosemide for weight less than 173 pounds  Please start eliquis 5 mg twice a day, blood thinner for atrial fibrillation- START taking eliquis on Thursday morning- 09/02/18  Samples given: Eliquis 5 mg Lot: [id number] Exp: 6/22 # 2 boxes given  Monitor heart rate at home, and blood pressure  If you need a refill on your cardiac medications before your next appointment, please call your pharmacy.    Lab work: No new labs needed   If you have labs (blood work) drawn today and your tests are completely normal, you will receive your results only by: Marland Kitchen MyChart Message (if you have MyChart) OR . A paper copy in the mail If you have any lab test that is abnormal or we need to change your treatment, we will call you to review the results.   Testing/Procedures: No new testing needed   Follow-Up: At [facility], you and your health needs are our priority.  As part of our continuing mission to provide you with exceptional heart care, we have created designated Provider Care Teams.  These Care Teams include your primary Cardiologist (physician) and Advanced Practice Providers (APPs -  Physician Assistants and Nurse Practitioners) who all work together to provide you with the care you need, when you need it.  . You will need a follow up appointment in 6 months  . Please call our office 2 months in advance to schedule this appointment- (call the office at the beginning of July 2020 to schedule with Dr. Mariah Milling)  . Providers on your designated Care Team:   . Nicolasa Ducking, NP . Eula Listen, PA-C . Marisue Ivan, PA-C  Any Other Special Instructions Will Be Listed Below (If Applicable).  For educational health videos Log in to : [email or web address] Or : [email or web address], password : [email or web address]

## 2018-08-30 NOTE — Telephone Encounter (Signed)
Need verbal PT orders   2w 3  1w  1

## 2018-08-31 ENCOUNTER — Ambulatory Visit: Payer: Medicare Other | Admitting: Nurse Practitioner

## 2018-09-07 DIAGNOSIS — Z7901 Long term (current) use of anticoagulants: Secondary | ICD-10-CM | POA: Diagnosis not present

## 2018-09-07 DIAGNOSIS — I5031 Acute diastolic (congestive) heart failure: Secondary | ICD-10-CM

## 2018-09-07 DIAGNOSIS — I110 Hypertensive heart disease with heart failure: Secondary | ICD-10-CM | POA: Diagnosis not present

## 2018-09-10 ENCOUNTER — Ambulatory Visit: Payer: Medicare Other | Admitting: Family

## 2018-09-13 ENCOUNTER — Other Ambulatory Visit: Payer: Self-pay

## 2018-09-13 ENCOUNTER — Ambulatory Visit
Admission: RE | Admit: 2018-09-13 | Discharge: 2018-09-13 | Disposition: A | Discharge status: Home / Self Care | Payer: Medicare Other | Source: Ambulatory Visit | Attending: Family Medicine | Admitting: Family Medicine

## 2018-09-13 DIAGNOSIS — Z1231 Encounter for screening mammogram for malignant neoplasm of breast: Secondary | ICD-10-CM | POA: Diagnosis not present

## 2018-09-16 ENCOUNTER — Other Ambulatory Visit: Payer: Self-pay | Admitting: Family Medicine

## 2018-09-16 NOTE — Telephone Encounter (Signed)
Pt has called asking about this refill

## 2018-09-16 NOTE — Telephone Encounter (Signed)
As long as she is taking lasix she should continue it  I sent it

## 2018-09-16 NOTE — Telephone Encounter (Signed)
?   If pt still needs to be on med, Rx was given in hospital with the directions of only taking it for 30 days, they prescribed #30 with 0 refills, please advise

## 2018-10-04 ENCOUNTER — Ambulatory Visit: Admit: 2018-10-04 | Payer: Medicare Other | Admitting: Ophthalmology

## 2018-10-04 SURGERY — PHACOEMULSIFICATION, CATARACT, WITH IOL INSERTION
Anesthesia: Topical | Laterality: Right

## 2018-10-05 ENCOUNTER — Other Ambulatory Visit: Payer: Self-pay | Admitting: Family Medicine

## 2018-10-06 NOTE — Telephone Encounter (Signed)
I kept it at 50 since Dr Mariah Milling did not change it at her f/u

## 2018-10-06 NOTE — Telephone Encounter (Signed)
Hospital doctor gave pt Rx for 50mg  and the directions on it was to take medication for 30 days so not sure if she is suppose to keep taking the 50mg  Rx or go back to the 100mg  that Dr. Milinda Antis prescribed previously, please advise

## 2018-10-11 ENCOUNTER — Ambulatory Visit: Payer: Medicare Other

## 2018-10-15 ENCOUNTER — Encounter: Payer: Medicare Other | Admitting: Family Medicine

## 2018-11-22 ENCOUNTER — Telehealth: Payer: Self-pay | Admitting: Family Medicine

## 2018-11-22 DIAGNOSIS — E782 Mixed hyperlipidemia: Secondary | ICD-10-CM

## 2018-11-22 DIAGNOSIS — I10 Essential (primary) hypertension: Secondary | ICD-10-CM

## 2018-11-22 DIAGNOSIS — E039 Hypothyroidism, unspecified: Secondary | ICD-10-CM

## 2018-11-22 DIAGNOSIS — R7303 Prediabetes: Secondary | ICD-10-CM

## 2018-11-22 DIAGNOSIS — M810 Age-related osteoporosis without current pathological fracture: Secondary | ICD-10-CM

## 2018-11-22 NOTE — Telephone Encounter (Signed)
-----   Message from Aquilla Solian, RT sent at 11/17/2018  1:12 PM EDT ----- Regarding: Lab Orders for Tuesday 5.26.2020 Please place lab orders for Tuesday 5.26.2020, office visit on Thursday 5.28.2020 Thank you, Jones Bales RT(R)

## 2018-11-23 ENCOUNTER — Ambulatory Visit (INDEPENDENT_AMBULATORY_CARE_PROVIDER_SITE_OTHER): Payer: Medicare Other

## 2018-11-23 ENCOUNTER — Other Ambulatory Visit: Payer: Self-pay

## 2018-11-23 ENCOUNTER — Other Ambulatory Visit (INDEPENDENT_AMBULATORY_CARE_PROVIDER_SITE_OTHER): Payer: Medicare Other

## 2018-11-23 VITALS — Wt 167.0 lb

## 2018-11-23 DIAGNOSIS — M810 Age-related osteoporosis without current pathological fracture: Secondary | ICD-10-CM | POA: Diagnosis not present

## 2018-11-23 DIAGNOSIS — Z0000 Encounter for general adult medical examination without abnormal findings: Secondary | ICD-10-CM

## 2018-11-23 DIAGNOSIS — E039 Hypothyroidism, unspecified: Secondary | ICD-10-CM | POA: Diagnosis not present

## 2018-11-23 DIAGNOSIS — R7303 Prediabetes: Secondary | ICD-10-CM | POA: Diagnosis not present

## 2018-11-23 DIAGNOSIS — I10 Essential (primary) hypertension: Secondary | ICD-10-CM | POA: Diagnosis not present

## 2018-11-23 DIAGNOSIS — E782 Mixed hyperlipidemia: Secondary | ICD-10-CM | POA: Diagnosis not present

## 2018-11-23 LAB — COMPREHENSIVE METABOLIC PANEL
ALT: 8 U/L (ref 0–35)
AST: 14 U/L (ref 0–37)
Albumin: 3.9 g/dL (ref 3.5–5.2)
Alkaline Phosphatase: 68 U/L (ref 39–117)
BUN: 18 mg/dL (ref 6–23)
CO2: 31 mEq/L (ref 19–32)
Calcium: 9.2 mg/dL (ref 8.4–10.5)
Chloride: 102 mEq/L (ref 96–112)
Creatinine, Ser: 0.94 mg/dL (ref 0.40–1.20)
GFR: 57.21 mL/min — ABNORMAL LOW (ref >60.00)
Glucose, Bld: 92 mg/dL (ref 70–99)
Potassium: 4.4 mEq/L (ref 3.5–5.1)
Sodium: 139 mEq/L (ref 135–145)
Total Bilirubin: 0.8 mg/dL (ref 0.2–1.2)
Total Protein: 7 g/dL (ref 6.0–8.3)

## 2018-11-23 LAB — LIPID PANEL
Cholesterol: 136 mg/dL (ref 0–200)
HDL: 43.1 mg/dL (ref >39.00)
LDL Cholesterol: 80 mg/dL (ref 0–99)
NonHDL: 92.86
Total CHOL/HDL Ratio: 3
Triglycerides: 66 mg/dL (ref 0.0–149.0)
VLDL: 13.2 mg/dL (ref 0.0–40.0)

## 2018-11-23 LAB — CBC WITH DIFFERENTIAL/PLATELET
Basophils Absolute: 0 10*3/uL (ref 0.0–0.1)
Basophils Relative: 0.7 % (ref 0.0–3.0)
Eosinophils Absolute: 0.1 10*3/uL (ref 0.0–0.7)
Eosinophils Relative: 2 % (ref 0.0–5.0)
HCT: 41 % (ref 36.0–46.0)
Hemoglobin: 14.1 g/dL (ref 12.0–15.0)
Lymphocytes Relative: 27.1 % (ref 12.0–46.0)
Lymphs Abs: 1.4 10*3/uL (ref 0.7–4.0)
MCHC: 34.4 g/dL (ref 30.0–36.0)
MCV: 91.7 fl (ref 78.0–100.0)
Monocytes Absolute: 0.3 10*3/uL (ref 0.1–1.0)
Monocytes Relative: 5.9 % (ref 3.0–12.0)
Neutro Abs: 3.2 10*3/uL (ref 1.4–7.7)
Neutrophils Relative %: 64.3 % (ref 43.0–77.0)
Platelets: 188 10*3/uL (ref 150.0–400.0)
RBC: 4.48 Mil/uL (ref 3.87–5.11)
RDW: 13.3 % (ref 11.5–15.5)
WBC: 5 10*3/uL (ref 4.0–10.5)

## 2018-11-23 LAB — VITAMIN D 25 HYDROXY (VIT D DEFICIENCY, FRACTURES): VITD: 63.89 ng/mL (ref 30.00–100.00)

## 2018-11-23 LAB — HEMOGLOBIN A1C: Hgb A1c MFr Bld: 5.6 % (ref 4.6–6.5)

## 2018-11-23 LAB — TSH: TSH: 3.51 u[IU]/mL (ref 0.35–4.50)

## 2018-11-23 NOTE — Progress Notes (Signed)
PCP notes:   Health maintenance:  No gaps idientified  Abnormal screenings:   None  Patient concerns:   None  Nurse concerns:  None  Next PCP appt:   11/05/18 @ 1215  I reviewed health advisor's note, was available for consultation, and agree with documentation and plan. [name] MD

## 2018-11-23 NOTE — Progress Notes (Signed)
Subjective:   Linda Johnson is a 83 y.o. female who presents for Medicare Annual (Subsequent) preventive examination.  Review of Systems:  N/A Cardiac Risk Factors include: advanced age (>8955men, 36>65 women);obesity (BMI >30kg/m2);hypertension;dyslipidemia     Objective:     Vitals: Wt 167 lb (75.8 kg) Comment: patient supplied  LMP  (LMP Unknown)   BMI 27.79 kg/m   Body mass index is 27.79 kg/m.  Advanced Directives 11/23/2018 08/25/2018 08/24/2018 10/02/2017 09/23/2016 09/13/2015  Does Patient Have a Medical Advance Directive? Yes Yes No Yes Yes Yes  Type of Estate agentAdvance Directive Healthcare Power of [place]orney;Living will Healthcare Power of Constellation Energyttorney - Healthcare Power of [place]orney;Living will Healthcare Power of [place]orney;Living will Healthcare Power of [place]orney;Living will  Does patient want to make changes to medical advance directive? - No - Patient declined - - - No - Patient declined  Copy of Healthcare Power of Attorney in Chart? No - copy requested - - Yes No - copy requested No - copy requested  Would patient like information on creating a medical advance directive? - No - Patient declined No - Patient declined - - -    Tobacco Social History   Tobacco Use  Smoking Status Never Smoker  Smokeless Tobacco Never Used     Counseling given: No   Clinical Intake:  Pre-visit preparation completed: Yes  Pain : No/denies pain Pain Score: 0-No pain     Nutritional Status: BMI 25 -29 Overweight Nutritional Risks: None Diabetes: No  How often do you need to have someone help you when you read instructions, pamphlets, or other written materials from your doctor or pharmacy?: 1 - Never  Interpreter Needed?: No  Information entered by :: LPinson, LPN  Past Medical History:  Diagnosis Date  . Atrial fibrillation (HCC)   . Cataract 2020   Right eye December 06, 2018, Left eye date pending  . Hypertension    Past Surgical History:  Procedure Laterality Date  . BREAST BIOPSY  Left 2011   2 areas, cysts per pt   Family History  Problem Relation Age of Onset  . Leukemia Sister   . Breast cancer Sister   . Breast cancer Maternal Aunt [id number]   Social History   Socioeconomic History  . Marital status: Divorced    Spouse name: Not on file  . Number of children: Not on file  . Years of education: Not on file  . Highest education level: Not on file  Occupational History  . Not on file  Social Needs  . Financial resource strain: Not on file  . Food insecurity:    Worry: Not on file    Inability: Not on file  . Transportation needs:    Medical: Not on file    Non-medical: Not on file  Tobacco Use  . Smoking status: Never Smoker  . Smokeless tobacco: Never Used  Substance and Sexual Activity  . Alcohol use: No    Alcohol/week: 0.0 standard drinks  . Drug use: No  . Sexual activity: Never  Lifestyle  . Physical activity:    Days per week: Not on file    Minutes per session: Not on file  . Stress: Not on file  Relationships  . Social connections:    Talks on phone: Not on file    Gets together: Not on file    Attends religious service: Not on file    Active member of club or organization: Not on file    Attends meetings of  clubs or organizations: Not on file    Relationship status: Not on file  Other Topics Concern  . Not on file  Social History Narrative  . Not on file    Outpatient Encounter Medications as of 11/23/2018  Medication Sig  . apixaban (ELIQUIS) 5 MG TABS tablet Take 1 tablet (5 mg total) by mouth 2 (two) times daily.  . calcium-vitamin D (OSCAL WITH D) 500-200 MG-UNIT per tablet Take 1 tablet by mouth 2 (two) times daily.  . Cholecalciferol (VITAMIN D3) 2000 UNITS TABS Take 1 tablet by mouth 2 (two) times daily.   Marland Kitchen diltiazem (CARDIZEM CD) 120 MG 24 hr capsule TAKE 1 CAPSULE (120 MG TOTAL) BY MOUTH DAILY.  . furosemide (LASIX) 20 MG tablet Take 1 tablet (20 mg) by mouth once daily for weight greater then 173 lbs  . gabapentin  (NEURONTIN) 100 MG capsule TAKE 2 CAPSULES (200 MG TOTAL) BY MOUTH 3 (THREE) TIMES DAILY.  Marland Kitchen latanoprost (XALATAN) 0.005 % ophthalmic solution INSTILL ONE DROP IN BOTH EYES AT BEDTIME.  Marland Kitchen losartan (COZAAR) 100 MG tablet TAKE 1 TABLET BY MOUTH EVERY DAY  . Multiple Vitamins-Minerals (HAIR SKIN AND NAILS FORMULA) TABS Take 1 tablet by mouth daily.  . prednisoLONE acetate (PRED FORTE) 1 % ophthalmic suspension Place 1 drop into both eyes at bedtime.  Marland Kitchen doxazosin (CARDURA) 4 MG tablet Take 1 tablet (4 mg total) by mouth 2 (two) times daily for 30 days.  Marland Kitchen losartan (COZAAR) 50 MG tablet Take 1 tablet (50 mg total) by mouth daily for 30 days.  . potassium chloride (K-DUR) 10 MEQ tablet TAKE 1 TABLET (10 MEQ TOTAL) BY MOUTH DAILY FOR 30 DAYS.   No facility-administered encounter medications on file as of 11/23/2018.     Activities of Daily Living In your present state of health, do you have any difficulty performing the following activities: 11/23/2018 08/25/2018  Hearing? N N  Vision? N N  Difficulty concentrating or making decisions? N N  Walking or climbing stairs? N Y  Dressing or bathing? N N  Doing errands, shopping? N N  Preparing Food and eating ? N -  Using the Toilet? N -  In the past six months, have you accidently leaked urine? N -  Do you have problems with loss of bowel control? N -  Managing your Medications? N -  Managing your Finances? N -  Housekeeping or managing your Housekeeping? N -  Some recent data might be hidden    Patient Care Team: Linda Johnson, Linda Gallus, MD as PCP - General Linda Johnson, Linda Pizza, MD as Consulting Physician (Cardiology)    Assessment:   This is a routine wellness examination for Linda Johnson.  Vision Screening Comments: Last vision exam in 2019 with Dr. Brooke Johnson  Exercise Activities and Dietary recommendations Current Exercise Habits: Home exercise routine, Type of exercise: Other - see comments(physical therapy exercises), Time (Minutes): 30, Frequency  (Times/Week): 6, Weekly Exercise (Minutes/Week): 180, Intensity: Mild, Exercise limited by: None identified  Goals    . Eat more fruits and vegetables     Starting 11/23/18, I will continue to eat at least 5 svgs of fresh fruits and vegetables daily.        Fall Risk Fall Risk  11/23/2018 10/02/2017 09/23/2016 09/13/2015 09/11/2014  Falls in the past year? 0 No No No No  Number falls in past yr: - - - - -  Risk for fall due to : - - - History of fall(s) -  Risk for  fall due to: Comment - - - fall approx 4 yrs ago with injury -   Depression Screen PHQ 2/9 Scores 11/23/2018 10/02/2017 09/23/2016 09/13/2015  PHQ - 2 Score 0 0 0 0  PHQ- 9 Score 0 0 - -     Cognitive Function MMSE - Mini Mental State Exam 11/23/2018 10/02/2017 09/23/2016 09/13/2015  Orientation to time Orientation to Place Registration Attention/ Calculation 0 0 0 5  Recall Language- name 2 objects 0 0 0 0  Language- repeat Language- follow 3 step command 0 Language- follow 3 step command-comments - unable to follow 1 step of 3 step command - -  Language- read & follow direction 0 0 0 1  Write a sentence 0 0 0 0  Copy design 0 0 0 0  Total score PLEASE NOTE: A Mini-Cog screen was completed. Maximum score is 17. A value of 0 denotes this part of Folstein MMSE was not completed or the patient failed this part of the Mini-Cog screening.   Mini-Cog Screening Orientation to Time - Max 5 pts Orientation to Place - Max 5 pts Registration - Max 3 pts Recall - Max 3 pts Language Repeat - Max 1 pts      Immunization History  Administered Date(s) Administered  . Influenza Split 04/01/2011, 03/17/2012  . Influenza Whole 04/23/2006, 04/02/2007, 03/27/2008, 04/16/2009, 04/23/2010  . Influenza,inj,Quad PF,6+ Mos 03/24/2013, 04/10/2014, 04/13/2015, 03/19/2016, 03/25/2017, 05/06/2018  . Pneumococcal Conjugate-13 09/11/2014  . Pneumococcal Polysaccharide-23  04/16/2009  . Td 04/23/2006    Screening Tests Health Maintenance  Topic Date Due  . TETANUS/TDAP  04/22/2026 (Originally 04/23/2016)  . INFLUENZA VACCINE  01/29/2019  . MAMMOGRAM  09/13/2019  . DEXA SCAN  Completed  . PNA vac Low Risk Adult  Completed     Plan:     I have personally reviewed, addressed, and noted the following in the patient's chart:  A. Medical and social history B. Use of alcohol, tobacco or illicit drugs  C. Current medications and supplements D. Functional ability and status E.  Nutritional status F.  Physical activity G. Advance directives H. List of other physicians I.  Hospitalizations, surgeries, and ER visits in previous 12 months J.  Vitals (unless it is a telemedicine encounter) K. Screenings to include cognitive, depression, hearing, vision (NOTE: hearing and vision screenings not completed in telemedicine encounter) L. Referrals and appointments   In addition, I have reviewed and discussed with patient certain preventive protocols, quality metrics, and best practice recommendations. A written personalized care plan for preventive services and recommendations were provided to patient.  With patient's permission, we connected on 11/23/18 at 12:00 PM EDT by a video enabled telemedicine application. Two patient identifiers were used to ensure the encounter occurred with the correct person.    Patient was in home and writer was in office.   Signed,   Randa Evens, MHA, BS, LPN Health Coach

## 2018-11-23 NOTE — Patient Instructions (Signed)
Ms. Goldring , Thank you for taking time to come for your Medicare Wellness Visit. I appreciate your ongoing commitment to your health goals. Please review the following plan we discussed and let me know if I can assist you in the future.   These are the goals we discussed: Goals    . Eat more fruits and vegetables     Starting 11/23/18, I will continue to eat at least 5 svgs of fresh fruits and vegetables daily.        This is a list of the screening recommended for you and due dates:  Health Maintenance  Topic Date Due  . Tetanus Vaccine  04/22/2026*  . Flu Shot  01/29/2019  . Mammogram  09/13/2019  . DEXA scan (bone density measurement)  Completed  . Pneumonia vaccines  Completed  *Topic was postponed. The date shown is not the original due date.   Preventive Care for Adults  A healthy lifestyle and preventive care can promote health and wellness. Preventive health guidelines for adults include the following key practices.  . A routine yearly physical is a good way to check with your health care provider about your health and preventive screening. It is a chance to share any concerns and updates on your health and to receive a thorough exam.  . Visit your dentist for a routine exam and preventive care every 6 months. Brush your teeth twice a day and floss once a day. Good oral hygiene prevents tooth decay and gum disease.  . The frequency of eye exams is based on your age, health, family medical history, use  of contact lenses, and other factors. Follow your health care provider's recommendations for frequency of eye exams.  . Eat a healthy diet. Foods like vegetables, fruits, whole grains, low-fat dairy products, and lean protein foods contain the nutrients you need without too many calories. Decrease your intake of foods high in solid fats, added sugars, and salt. Eat the right amount of calories for you. Get information about a proper diet from your health care provider, if  necessary.  . Regular physical exercise is one of the most important things you can do for your health. Most adults should get at least 150 minutes of moderate-intensity exercise (any activity that increases your heart rate and causes you to sweat) each week. In addition, most adults need muscle-strengthening exercises on 2 or more days a week.  Silver Sneakers may be a benefit available to you. To determine eligibility, you may visit the website: www.silversneakers.com or contact program at 224-617-2568 Mon-Fri between 8AM-8PM.   . Maintain a healthy weight. The body mass index (BMI) is a screening tool to identify possible weight problems. It provides an estimate of body fat based on height and weight. Your health care provider can find your BMI and can help you achieve or maintain a healthy weight.   For adults 20 years and older: ? A BMI below 18.5 is considered underweight. ? A BMI of 18.5 to 24.9 is normal. ? A BMI of 25 to 29.9 is considered overweight. ? A BMI of 30 and above is considered obese.   . Maintain normal blood lipids and cholesterol levels by exercising and minimizing your intake of saturated fat. Eat a balanced diet with plenty of fruit and vegetables. Blood tests for lipids and cholesterol should begin at age 53 and be repeated every 5 years. If your lipid or cholesterol levels are high, you are over 50, or you are at high  risk for heart disease, you may need your cholesterol levels checked more frequently. Ongoing high lipid and cholesterol levels should be treated with medicines if diet and exercise are not working.  . If you smoke, find out from your health care provider how to quit. If you do not use tobacco, please do not start.  . If you choose to drink alcohol, please do not consume more than 2 drinks per day. One drink is considered to be 12 ounces (355 mL) of beer, 5 ounces (148 mL) of wine, or 1.5 ounces (44 mL) of liquor.  . If you are 10-62 years old, ask your  health care provider if you should take aspirin to prevent strokes.  . Use sunscreen. Apply sunscreen liberally and repeatedly throughout the day. You should seek shade when your shadow is shorter than you. Protect yourself by wearing long sleeves, pants, a wide-brimmed hat, and sunglasses year round, whenever you are outdoors.  . Once a month, do a whole body skin exam, using a mirror to look at the skin on your back. Tell your health care provider of new moles, moles that have irregular borders, moles that are larger than a pencil eraser, or moles that have changed in shape or color.

## 2018-11-25 ENCOUNTER — Encounter: Payer: Self-pay | Admitting: Family Medicine

## 2018-11-25 ENCOUNTER — Other Ambulatory Visit: Payer: Self-pay

## 2018-11-25 ENCOUNTER — Ambulatory Visit (INDEPENDENT_AMBULATORY_CARE_PROVIDER_SITE_OTHER): Payer: Medicare Other | Admitting: Family Medicine

## 2018-11-25 VITALS — BP 140/78 | HR 95 | Temp 97.5°F | Ht 63.0 in | Wt 167.2 lb

## 2018-11-25 DIAGNOSIS — E782 Mixed hyperlipidemia: Secondary | ICD-10-CM

## 2018-11-25 DIAGNOSIS — E6609 Other obesity due to excess calories: Secondary | ICD-10-CM

## 2018-11-25 DIAGNOSIS — E039 Hypothyroidism, unspecified: Secondary | ICD-10-CM | POA: Diagnosis not present

## 2018-11-25 DIAGNOSIS — I10 Essential (primary) hypertension: Secondary | ICD-10-CM

## 2018-11-25 DIAGNOSIS — R7303 Prediabetes: Secondary | ICD-10-CM

## 2018-11-25 DIAGNOSIS — M810 Age-related osteoporosis without current pathological fracture: Secondary | ICD-10-CM | POA: Diagnosis not present

## 2018-11-25 DIAGNOSIS — Z6830 Body mass index (BMI) 30.0-30.9, adult: Secondary | ICD-10-CM

## 2018-11-25 NOTE — Assessment & Plan Note (Signed)
Will put off dexa for now with pandemic  Disc need for calcium/ vitamin D/ wt bearing exercise and bone density test every 2 y to monitor Disc safety/ fracture risk in detail   D level is therapeutic

## 2018-11-25 NOTE — Patient Instructions (Addendum)
If you are interested in the new shingles vaccine (Shingrix) - call your local pharmacy to check on coverage and availability  If affordable, get on a wait list at your pharmacy to get the vaccine.   Eat a healthy diet  For cholesterol Avoid red meat/ fried foods/ egg yolks/ fatty breakfast meats/ butter, cheese and high fat dairy/ and shellfish   For diabetes prevention Try to get most of your carbohydrates from produce (with the exception of white potatoes)  Eat less bread/pasta/rice/snack foods/cereals/sweets and other items from the middle of the grocery store (processed carbs)   Stay as active as you can  Keep doing your exercises Keep using your walker  Continue to watch your sodium intake for your heart   Glad you are doing well !

## 2018-11-25 NOTE — Assessment & Plan Note (Signed)
Hypothyroidism  Pt has no clinical changes No change in energy level/ hair or skin/ edema and no tremor Lab Results  Component Value Date   TSH 3.51 11/23/2018

## 2018-11-25 NOTE — Progress Notes (Signed)
Subjective:    Patient ID: Linda Johnson, female    DOB: 10/04/1935, 83 y.o.   MRN: [id number]  HPI Here for annual f/u of chronic medical problems  Has been feeling very good  Keeping her weight down   Weight : Wt Readings from Last 3 Encounters:  11/25/18 167 lb 4 oz (75.9 kg)  11/23/18 167 lb (75.8 kg)  08/30/18 169 lb 8 oz (76.9 kg)  she has taken good care of herself  Daily weights  Watching sodium  Appetite is not nearly as big  29.63 kg/m   Staying home during pandemic Has things delivered and kids bring food/ etc   amw was 5/26 No gaps or concerns noted   Mammogram 3/20 Self breast exam - no lumps or changes   dexa 4/18  Stable osteoporosis  Had 5 y on fosamax  Good D level of 63  No falls or fractures   Colon screen-out aged   Zoster status- had shingles with post herpetic neuralgia  Has been much better - that wsa a year ago   HTN bp is stable today  No cp or palpitations or headaches or edema  No side effects to medicines  BP Readings from Last 3 Encounters:  11/25/18 140/78  08/30/18 134/86  08/26/18 108/61     Pulse Readings from Last 3 Encounters:  11/25/18 95  08/30/18 100  08/26/18 (!) 140   Some exercise- uses her walker  Does exercise in the house  Started with PT in the house and kept doing it  She feels stronger now   Lab Results  Component Value Date   CREATININE 0.94 11/23/2018   BUN 18 11/23/2018   NA 139 11/23/2018   K 4.4 11/23/2018   CL 102 11/23/2018   CO2 31 11/23/2018   Lab Results  Component Value Date   ALT 8 11/23/2018   AST 14 11/23/2018   ALKPHOS 68 11/23/2018   BILITOT 0.8 11/23/2018   Lab Results  Component Value Date   WBC 5.0 11/23/2018   HGB 14.1 11/23/2018   HCT 41.0 11/23/2018   MCV 91.7 11/23/2018   PLT 188.0 11/23/2018     CHF- is on a fluid restriction  Not sob at all  A fib-no change - likes the eliquis   Hypothyroidism  Pt has no clinical changes No change in energy level/ hair  or skin/ edema and no tremor Lab Results  Component Value Date   TSH 3.51 11/23/2018     Hyperlipidemia Lab Results  Component Value Date   CHOL 136 11/23/2018   CHOL 126 10/02/2017   CHOL 152 09/15/2016   Lab Results  Component Value Date   HDL 43.10 11/23/2018   HDL 50.60 10/02/2017   HDL 50.00 09/15/2016   Lab Results  Component Value Date   LDLCALC 80 11/23/2018   LDLCALC 56 10/02/2017   LDLCALC 87 09/15/2016   Lab Results  Component Value Date   TRIG 66.0 11/23/2018   TRIG 101.0 10/02/2017   TRIG 76.0 09/15/2016   Lab Results  Component Value Date   CHOLHDL 3 11/23/2018   CHOLHDL 2 10/02/2017   CHOLHDL 3 09/15/2016   Lab Results  Component Value Date   LDLDIRECT 132.4 05/06/2010   LDLDIRECT 107.9 04/02/2007   well controlled with no medication  She has had some takeout food - perhaps more fatty  [employer] food    Prediabetes Lab Results  Component Value Date   HGBA1C 5.6 11/23/2018  Down from 5.8  Patient Active Problem List   Diagnosis Date Noted  . Chronic diastolic CHF (congestive heart failure) (HCC) 08/30/2018  . Short of breath on exertion 08/23/2018  . Pedal edema 08/23/2018  . Long term (current) use of anticoagulants 01/21/2018  . PVC (premature ventricular contraction) 01/18/2018  . Poor balance 10/05/2017  . Post herpetic neuralgia 09/23/2017  . Thoracic back pain 09/23/2017  . Encounter for therapeutic drug monitoring 09/24/2015  . Encounter for anticoagulation discussion and counseling 09/20/2015  . Irregular heart rate 09/17/2015  . Atrial fibrillation (HCC) 09/17/2015  . Hearing loss 09/17/2015  . Hypothyroidism 12/12/2014  . Prediabetes 09/11/2014  . Encounter for Medicare annual wellness exam 09/11/2014  . Estrogen deficiency 09/11/2014  . Routine general medical examination at a health care facility 09/09/2013  . Low back pain 03/21/2013  . Other screening mammogram 04/19/2012  . Post-menopausal 04/19/2012  .  Obesity 08/18/2011  . Hyperlipidemia 06/12/2010  . Osteoporosis 06/12/2010  . ALLERGIC RHINITIS CAUSE UNSPECIFIED 04/12/2008  . HX, PERSONAL, COLONIC POLYPS 04/02/2007  . STRABISMUS 03/02/2007  . Essential hypertension 03/02/2007   Past Medical History:  Diagnosis Date  . Atrial fibrillation (HCC)   . Cataract 2020   Right eye December 06, 2018, Left eye date pending  . Hypertension    Past Surgical History:  Procedure Laterality Date  . BREAST BIOPSY Left 2011   2 areas, cysts per pt   Social History   Tobacco Use  . Smoking status: Never Smoker  . Smokeless tobacco: Never Used  Substance Use Topics  . Alcohol use: No    Alcohol/week: 0.0 standard drinks  . Drug use: No   Family History  Problem Relation Age of Onset  . Leukemia Sister   . Breast cancer Sister   . Breast cancer Maternal Aunt 50   Allergies  Allergen Reactions  . Ace Inhibitors     REACTION: cough  . Hydrocodone-Homatropine Nausea Only  . Tramadol Hcl     nausea   Current Outpatient Medications on File Prior to Visit  Medication Sig Dispense Refill  . apixaban (ELIQUIS) 5 MG TABS tablet Take 1 tablet (5 mg total) by mouth 2 (two) times daily. 60 tablet 6  . calcium-vitamin D (OSCAL WITH D) 500-200 MG-UNIT per tablet Take 1 tablet by mouth 2 (two) times daily.    . Cholecalciferol (VITAMIN D3) 2000 UNITS TABS Take 1 tablet by mouth 2 (two) times daily.     Marland Kitchen diltiazem (CARDIZEM CD) 120 MG 24 hr capsule TAKE 1 CAPSULE (120 MG TOTAL) BY MOUTH DAILY. 30 capsule 6  . furosemide (LASIX) 20 MG tablet Take 1 tablet (20 mg) by mouth once daily for weight greater then 173 lbs    . latanoprost (XALATAN) 0.005 % ophthalmic solution INSTILL ONE DROP IN BOTH EYES AT BEDTIME.  3  . losartan (COZAAR) 100 MG tablet TAKE 1 TABLET BY MOUTH EVERY DAY 90 tablet 3  . Multiple Vitamins-Minerals (HAIR SKIN AND NAILS FORMULA) TABS Take 1 tablet by mouth daily.    . prednisoLONE acetate (PRED FORTE) 1 % ophthalmic suspension  Place 1 drop into both eyes at bedtime.    Marland Kitchen doxazosin (CARDURA) 4 MG tablet Take 1 tablet (4 mg total) by mouth 2 (two) times daily for 30 days. 60 tablet 0  . losartan (COZAAR) 50 MG tablet Take 1 tablet (50 mg total) by mouth daily for 30 days. 30 tablet 0  . potassium chloride (K-DUR) 10 MEQ tablet TAKE 1  TABLET (10 MEQ TOTAL) BY MOUTH DAILY FOR 30 DAYS. 30 tablet 5   No current facility-administered medications on file prior to visit.      Review of Systems  Constitutional: Negative for activity change, appetite change, fatigue, fever and unexpected weight change.  HENT: Negative for congestion, ear pain, rhinorrhea, sinus pressure and sore throat.   Eyes: Positive for visual disturbance. Negative for pain and redness.       Having cataract surgery for impaired vision soon   Respiratory: Negative for cough, shortness of breath and wheezing.   Cardiovascular: Negative for chest pain and palpitations.  Gastrointestinal: Negative for abdominal pain, blood in stool, constipation and diarrhea.  Endocrine: Negative for polydipsia and polyuria.  Genitourinary: Negative for dysuria, frequency and urgency.  Musculoskeletal: Positive for arthralgias. Negative for back pain and myalgias.  Skin: Negative for pallor and rash.  Allergic/Immunologic: Negative for environmental allergies.  Neurological: Negative for dizziness, syncope and headaches.  Hematological: Negative for adenopathy. Does not bruise/bleed easily.  Psychiatric/Behavioral: Negative for decreased concentration and dysphoric mood. The patient is not nervous/anxious.        Objective:   Physical Exam Constitutional:      General: She is not in acute distress.    Appearance: Normal appearance. She is well-developed. She is obese. She is not ill-appearing or diaphoretic.  HENT:     Head: Normocephalic and atraumatic.     Right Ear: Tympanic membrane, ear canal and external ear normal.     Left Ear: Tympanic membrane, ear canal  and external ear normal.     Nose: Nose normal.     Mouth/Throat:     Mouth: Mucous membranes are moist.     Pharynx: Oropharynx is clear.  Eyes:     General: No scleral icterus.       Right eye: No discharge.        Left eye: No discharge.     Conjunctiva/sclera: Conjunctivae normal.     Pupils: Pupils are equal, round, and reactive to light.  Neck:     Musculoskeletal: Normal range of motion and neck supple.     Thyroid: No thyromegaly.     Vascular: No carotid bruit or JVD.  Cardiovascular:     Rate and Rhythm: Tachycardia present. Rhythm irregular.     Heart sounds: Normal heart sounds. No gallop.      Comments: A fib  Irregularly irregular rhythm Pulmonary:     Effort: Pulmonary effort is normal. No respiratory distress.     Breath sounds: Normal breath sounds. No wheezing or rales.  Abdominal:     General: Bowel sounds are normal. There is no distension.     Palpations: Abdomen is soft. There is no mass.     Tenderness: There is no abdominal tenderness.  Musculoskeletal:        General: No tenderness.     Comments: Kyphosis (baseline)   Lymphadenopathy:     Cervical: No cervical adenopathy.  Skin:    General: Skin is warm and dry.     Capillary Refill: Capillary refill takes less than 2 seconds.     Coloration: Skin is not pale.     Findings: No erythema or rash.     Comments: Scattered SKs  One on L shin   Solar lentigines diffusely   Neurological:     Mental Status: She is alert. Mental status is at baseline.     Cranial Nerves: No cranial nerve deficit.     Motor: No  abnormal muscle tone.     Coordination: Coordination normal.     Deep Tendon Reflexes: Reflexes are normal and symmetric. Reflexes normal.  Psychiatric:        Mood and Affect: Mood normal.     Comments: Talkative Good mood            Assessment & Plan:   Problem List Items Addressed This Visit      Cardiovascular and Mediastinum   Essential hypertension - Primary    bp in fair  control at this time  BP Readings from Last 1 Encounters:  11/25/18 140/78   No changes needed Most recent labs reviewed  Disc lifstyle change with low sodium diet and exercise  Stable CHF and a fit (rate is up slightly)         Endocrine   Hypothyroidism    Hypothyroidism  Pt has no clinical changes No change in energy level/ hair or skin/ edema and no tremor Lab Results  Component Value Date   TSH 3.51 11/23/2018            Musculoskeletal and Integument   Osteoporosis    Will put off dexa for now with pandemic  Disc need for calcium/ vitamin D/ wt bearing exercise and bone density test every 2 y to monitor Disc safety/ fracture risk in detail   D level is therapeutic         Other   Hyperlipidemia    Disc goals for lipids and reasons to control them Rev last labs with pt Rev low sat fat diet in detail  Overall good control but LDL is up and HDL down from last time  Disc lifestyle habits       Obesity    Discussed how this problem influences overall health and the risks it imposes  Reviewed plan for weight loss with lower calorie diet (via better food choices and also portion control or program like weight watchers) and exercise building up to or more than 30 minutes 5 days per week including some aerobic activity   Enc walking safely with her walker       Prediabetes    Lab Results  Component Value Date   HGBA1C 5.6 11/23/2018   This is fairly stable  disc imp of low glycemic diet and wt loss to prevent DM2

## 2018-11-25 NOTE — Assessment & Plan Note (Signed)
Disc goals for lipids and reasons to control them Rev last labs with pt Rev low sat fat diet in detail  Overall good control but LDL is up and HDL down from last time  Disc lifestyle habits

## 2018-11-25 NOTE — Assessment & Plan Note (Signed)
Lab Results  Component Value Date   HGBA1C 5.6 11/23/2018   This is fairly stable  disc imp of low glycemic diet and wt loss to prevent DM2

## 2018-11-25 NOTE — Assessment & Plan Note (Signed)
bp in fair control at this time  BP Readings from Last 1 Encounters:  11/25/18 140/78   No changes needed Most recent labs reviewed  Disc lifstyle change with low sodium diet and exercise  Stable CHF and a fit (rate is up slightly)

## 2018-11-25 NOTE — Assessment & Plan Note (Signed)
Discussed how this problem influences overall health and the risks it imposes  Reviewed plan for weight loss with lower calorie diet (via better food choices and also portion control or program like weight watchers) and exercise building up to or more than 30 minutes 5 days per week including some aerobic activity   Enc walking safely with her walker

## 2018-11-29 ENCOUNTER — Encounter: Payer: Self-pay | Admitting: *Deleted

## 2018-11-29 ENCOUNTER — Other Ambulatory Visit: Payer: Self-pay

## 2018-12-02 ENCOUNTER — Other Ambulatory Visit
Admission: RE | Admit: 2018-12-02 | Discharge: 2018-12-02 | Disposition: A | Discharge status: Home / Self Care | Payer: Medicare Other | Source: Ambulatory Visit | Attending: Ophthalmology | Admitting: Ophthalmology

## 2018-12-02 ENCOUNTER — Other Ambulatory Visit: Payer: Self-pay

## 2018-12-02 DIAGNOSIS — Z1159 Encounter for screening for other viral diseases: Secondary | ICD-10-CM | POA: Insufficient documentation

## 2018-12-02 NOTE — Discharge Instructions (Signed)

## 2018-12-03 LAB — NOVEL CORONAVIRUS, NAA (HOSP ORDER, SEND-OUT TO REF LAB; TAT 18-24 HRS): SARS-CoV-2, NAA: NOT DETECTED

## 2018-12-06 ENCOUNTER — Ambulatory Visit: Payer: Medicare Other | Admitting: Anesthesiology

## 2018-12-06 ENCOUNTER — Encounter
Admission: RE | Disposition: A | Discharge status: Home / Self Care | Payer: Self-pay | Source: Home / Self Care | Attending: Ophthalmology

## 2018-12-06 ENCOUNTER — Ambulatory Visit
Admission: RE | Admit: 2018-12-06 | Discharge: 2018-12-06 | Disposition: A | Discharge status: Home / Self Care | Payer: Medicare Other | Attending: Ophthalmology | Admitting: Ophthalmology

## 2018-12-06 DIAGNOSIS — Z79899 Other long term (current) drug therapy: Secondary | ICD-10-CM | POA: Insufficient documentation

## 2018-12-06 DIAGNOSIS — I509 Heart failure, unspecified: Secondary | ICD-10-CM | POA: Diagnosis not present

## 2018-12-06 DIAGNOSIS — I482 Chronic atrial fibrillation, unspecified: Secondary | ICD-10-CM | POA: Insufficient documentation

## 2018-12-06 DIAGNOSIS — Z7901 Long term (current) use of anticoagulants: Secondary | ICD-10-CM | POA: Insufficient documentation

## 2018-12-06 DIAGNOSIS — I110 Hypertensive heart disease with heart failure: Secondary | ICD-10-CM | POA: Insufficient documentation

## 2018-12-06 DIAGNOSIS — H2511 Age-related nuclear cataract, right eye: Secondary | ICD-10-CM | POA: Insufficient documentation

## 2018-12-06 HISTORY — DX: Unspecified osteoarthritis, unspecified site: M19.90

## 2018-12-06 HISTORY — PX: CATARACT EXTRACTION W/PHACO: SHX586

## 2018-12-06 SURGERY — PHACOEMULSIFICATION, CATARACT, WITH IOL INSERTION
Anesthesia: Monitor Anesthesia Care | Site: Eye | Laterality: Right

## 2018-12-06 MED ORDER — SODIUM HYALURONATE 10 MG/ML IO SOLN
INTRAOCULAR | Status: DC | PRN
  Administered 2018-12-06: 0.55 mL via INTRAOCULAR

## 2018-12-06 MED ORDER — ARMC OPHTHALMIC DILATING DROPS
1.0000 "application " | OPHTHALMIC | Status: DC | PRN
  Administered 2018-12-06 (×3): 1 via OPHTHALMIC

## 2018-12-06 MED ORDER — MOXIFLOXACIN HCL 0.5 % OP SOLN
OPHTHALMIC | Status: DC | PRN
  Administered 2018-12-06: 0.2 mL via OPHTHALMIC

## 2018-12-06 MED ORDER — LACTATED RINGERS IV SOLN: 10.0000 mL/h | INTRAVENOUS | Status: DC

## 2018-12-06 MED ORDER — MIDAZOLAM HCL 2 MG/2ML IJ SOLN
INTRAMUSCULAR | Status: DC | PRN
  Administered 2018-12-06 (×2): 1 mg via INTRAVENOUS

## 2018-12-06 MED ORDER — SODIUM HYALURONATE 23 MG/ML IO SOLN
INTRAOCULAR | Status: DC | PRN
  Administered 2018-12-06: 0.6 mL via INTRAOCULAR

## 2018-12-06 MED ORDER — LIDOCAINE HCL (PF) 2 % IJ SOLN
INTRAOCULAR | Status: DC | PRN
  Administered 2018-12-06: 1 mL via INTRAOCULAR

## 2018-12-06 MED ORDER — FENTANYL CITRATE (PF) 100 MCG/2ML IJ SOLN
INTRAMUSCULAR | Status: DC | PRN
  Administered 2018-12-06: 50 ug via INTRAVENOUS

## 2018-12-06 MED ORDER — EPINEPHRINE PF 1 MG/ML IJ SOLN
INTRAOCULAR | Status: DC | PRN
  Administered 2018-12-06: 68 mL via OPHTHALMIC

## 2018-12-06 MED ORDER — TETRACAINE HCL 0.5 % OP SOLN
1.0000 [drp] | OPHTHALMIC | Status: DC | PRN
  Administered 2018-12-06 (×3): 1 [drp] via OPHTHALMIC

## 2018-12-06 SURGICAL SUPPLY — 19 items
CANNULA ANT/CHMB 27G (MISCELLANEOUS) ×2 IMPLANT
CANNULA ANT/CHMB 27GA (MISCELLANEOUS) ×6 IMPLANT
DISSECTOR HYDRO NUCLEUS 50X22 (MISCELLANEOUS) ×3 IMPLANT
GLOVE SURG LX 7.5 STRW (GLOVE) ×4
GLOVE SURG LX STRL 7.5 STRW (GLOVE) ×1 IMPLANT
GLOVE SURG SYN 8.5  E (GLOVE) ×2
GLOVE SURG SYN 8.5 E (GLOVE) ×1 IMPLANT
GLOVE SURG SYN 8.5 PF PI (GLOVE) ×1 IMPLANT
GOWN STRL REUS W/ TWL LRG LVL3 (GOWN DISPOSABLE) ×2 IMPLANT
GOWN STRL REUS W/TWL LRG LVL3 (GOWN DISPOSABLE) ×6
LENS IOL TECNIS ITEC 20.0 (Intraocular Lens) ×2 IMPLANT
MARKER SKIN DUAL TIP RULER LAB (MISCELLANEOUS) ×3 IMPLANT
PACK DR. KING ARMS (PACKS) ×3 IMPLANT
PACK EYE AFTER SURG (MISCELLANEOUS) ×3 IMPLANT
PACK OPTHALMIC (MISCELLANEOUS) ×3 IMPLANT
SYR 3ML LL SCALE MARK (SYRINGE) ×3 IMPLANT
SYR TB 1ML LUER SLIP (SYRINGE) ×3 IMPLANT
WATER STERILE IRR 250ML POUR (IV SOLUTION) ×3 IMPLANT
WIPE NON LINTING 3.25X3.25 (MISCELLANEOUS) ×3 IMPLANT

## 2018-12-06 NOTE — H&P (Signed)

## 2018-12-06 NOTE — Transfer of Care (Signed)
Immediate Anesthesia Transfer of Care Note  Patient: Linda Johnson  Procedure(s) Performed: CATARACT EXTRACTION PHACO AND INTRAOCULAR LENS PLACEMENT ([facility]) RIGHT (Right Eye)  Patient Location: PACU  Anesthesia Type: MAC  Level of Consciousness: awake, alert  and patient cooperative  Airway and Oxygen Therapy: Patient Spontanous Breathing and Patient connected to supplemental oxygen  Post-op Assessment: Post-op Vital signs reviewed, Patient's Cardiovascular Status Stable, Respiratory Function Stable, Patent Airway and No signs of Nausea or vomiting  Post-op Vital Signs: Reviewed and stable  Complications: No apparent anesthesia complications

## 2018-12-06 NOTE — Op Note (Signed)
OPERATIVE NOTE  Linda Johnson [id number] 12/06/2018   PREOPERATIVE DIAGNOSIS:  Nuclear sclerotic cataract right eye.  H25.11   POSTOPERATIVE DIAGNOSIS:    Nuclear sclerotic cataract right eye.     PROCEDURE:  Phacoemusification with posterior chamber intraocular lens placement of the right eye   LENS:   Implant Name Type Inv. Item Serial No. Manufacturer Lot No. LRB No. Used  LENS IOL DIOP 20.0 - [id number] Intraocular Lens LENS IOL DIOP 20.0 [id number] AMO  Right 1       PCB00 +20.0   ULTRASOUND TIME: 1 minutes 02 seconds.  CDE 6.14   SURGEON:  Benay Pillow, MD, MPH  ANESTHESIOLOGIST: Anesthesiologist: Marice Potter, MD CRNA: Cameron Ali, CRNA; Janna Arch, CRNA   ANESTHESIA:  Topical with tetracaine drops augmented with 1% preservative-free intracameral lidocaine.  ESTIMATED BLOOD LOSS: less than 1 mL.   COMPLICATIONS:  None.   DESCRIPTION OF PROCEDURE:  The patient was identified in the holding room and transported to the operating room and placed in the supine position under the operating microscope.  The right eye was identified as the operative eye and it was prepped and draped in the usual sterile ophthalmic fashion.   A 1.0 millimeter clear-corneal paracentesis was made at the 10:30 position. 0.5 ml of preservative-free 1% lidocaine with epinephrine was injected into the anterior chamber.  The anterior chamber was filled with Healon 5 viscoelastic.  A 2.4 millimeter keratome was used to make a near-clear corneal incision at the 8:00 position.  A curvilinear capsulorrhexis was made with a cystotome and capsulorrhexis forceps.  Balanced salt solution was used to hydrodissect and hydrodelineate the nucleus.   Phacoemulsification was then used in stop and chop fashion to remove the lens nucleus and epinucleus.  The remaining cortex was then removed using the irrigation and aspiration handpiece. Healon was then placed into the capsular bag to distend it for  lens placement.  A lens was then injected into the capsular bag.  The remaining viscoelastic was aspirated.   Wounds were hydrated with balanced salt solution.  The anterior chamber was inflated to a physiologic pressure with balanced salt solution.   Intracameral vigamox 0.1 mL undiluted was injected into the eye and a drop placed onto the ocular surface.  No wound leaks were noted.  The patient was taken to the recovery room in stable condition without complications of anesthesia or surgery  Benay Pillow 12/06/2018, 11:43 AM

## 2018-12-06 NOTE — Anesthesia Preprocedure Evaluation (Signed)
Anesthesia Evaluation  Patient identified by MRN, date of birth, ID band Patient awake    Reviewed: Allergy & Precautions, H&P , NPO status , Patient's Chart, lab work & pertinent test results, reviewed documented beta blocker date and time   Airway Mallampati: II  TM Distance: >3 FB Neck ROM: full    Dental no notable dental hx.    Pulmonary neg pulmonary ROS,    Pulmonary exam normal breath sounds clear to auscultation       Cardiovascular Exercise Tolerance: Good hypertension, +CHF   Rhythm:regular Rate:Normal  A Fib    Neuro/Psych negative neurological ROS  negative psych ROS   GI/Hepatic negative GI ROS, Neg liver ROS,   Endo/Other  Hypothyroidism   Renal/GU negative Renal ROS  negative genitourinary   Musculoskeletal   Abdominal   Peds  Hematology negative hematology ROS (+)   Anesthesia Other Findings   Reproductive/Obstetrics negative OB ROS                             Anesthesia Physical Anesthesia Plan  ASA: II  Anesthesia Plan: MAC   Post-op Pain Management:    Induction:   PONV Risk Score and Plan:   Airway Management Planned:   Additional Equipment:   Intra-op Plan:   Post-operative Plan:   Informed Consent: I have reviewed the patients History and Physical, chart, labs and discussed the procedure including the risks, benefits and alternatives for the proposed anesthesia with the patient or authorized representative who has indicated his/her understanding and acceptance.     Dental Advisory Given  Plan Discussed with: CRNA  Anesthesia Plan Comments:         Anesthesia Quick Evaluation

## 2018-12-06 NOTE — Anesthesia Procedure Notes (Signed)
Procedure Name: MAC Date/Time: 12/06/2018 11:23 AM Performed by: Janna Arch, CRNA Pre-anesthesia Checklist: Patient identified, Emergency Drugs available, Suction available, Timeout performed and Patient being monitored Patient Re-evaluated:Patient Re-evaluated prior to induction Oxygen Delivery Method: Nasal cannula Placement Confirmation: positive ETCO2

## 2018-12-06 NOTE — Anesthesia Postprocedure Evaluation (Signed)
Anesthesia Post Note  Patient: Linda Johnson  Procedure(s) Performed: CATARACT EXTRACTION PHACO AND INTRAOCULAR LENS PLACEMENT ([place]) RIGHT (Right Eye)  Patient location during evaluation: PACU Anesthesia Type: MAC Level of consciousness: awake and alert Pain management: pain level controlled Vital Signs Assessment: post-procedure vital signs reviewed and stable Respiratory status: spontaneous breathing, nonlabored ventilation, respiratory function stable and patient connected to nasal cannula oxygen Cardiovascular status: stable and blood pressure returned to baseline Postop Assessment: no apparent nausea or vomiting Anesthetic complications: no    SCOURAS, NICOLE ELAINE

## 2018-12-07 ENCOUNTER — Encounter: Payer: Self-pay | Admitting: Ophthalmology

## 2019-01-06 ENCOUNTER — Ambulatory Visit: Payer: Self-pay | Admitting: General Practice

## 2019-01-06 ENCOUNTER — Other Ambulatory Visit: Payer: Self-pay

## 2019-01-13 ENCOUNTER — Other Ambulatory Visit
Admission: RE | Admit: 2019-01-13 | Discharge: 2019-01-13 | Disposition: A | Discharge status: Home / Self Care | Payer: Medicare Other | Source: Ambulatory Visit | Attending: Ophthalmology | Admitting: Ophthalmology

## 2019-01-13 ENCOUNTER — Other Ambulatory Visit: Payer: Self-pay

## 2019-01-13 DIAGNOSIS — Z1159 Encounter for screening for other viral diseases: Secondary | ICD-10-CM | POA: Insufficient documentation

## 2019-01-13 LAB — SARS CORONAVIRUS 2 (TAT 6-24 HRS): SARS Coronavirus 2: NEGATIVE

## 2019-01-13 NOTE — Discharge Instructions (Signed)

## 2019-01-17 ENCOUNTER — Ambulatory Visit: Payer: Medicare Other | Admitting: Anesthesiology

## 2019-01-17 ENCOUNTER — Other Ambulatory Visit: Payer: Self-pay

## 2019-01-17 ENCOUNTER — Encounter
Admission: RE | Disposition: A | Discharge status: Home / Self Care | Payer: Self-pay | Source: Home / Self Care | Attending: Ophthalmology

## 2019-01-17 ENCOUNTER — Ambulatory Visit
Admission: RE | Admit: 2019-01-17 | Discharge: 2019-01-17 | Disposition: A | Discharge status: Home / Self Care | Payer: Medicare Other | Attending: Ophthalmology | Admitting: Ophthalmology

## 2019-01-17 DIAGNOSIS — H2512 Age-related nuclear cataract, left eye: Secondary | ICD-10-CM | POA: Diagnosis not present

## 2019-01-17 DIAGNOSIS — I509 Heart failure, unspecified: Secondary | ICD-10-CM | POA: Diagnosis not present

## 2019-01-17 DIAGNOSIS — I482 Chronic atrial fibrillation, unspecified: Secondary | ICD-10-CM | POA: Diagnosis not present

## 2019-01-17 DIAGNOSIS — I110 Hypertensive heart disease with heart failure: Secondary | ICD-10-CM | POA: Insufficient documentation

## 2019-01-17 DIAGNOSIS — Z7901 Long term (current) use of anticoagulants: Secondary | ICD-10-CM | POA: Diagnosis not present

## 2019-01-17 DIAGNOSIS — Z79899 Other long term (current) drug therapy: Secondary | ICD-10-CM | POA: Insufficient documentation

## 2019-01-17 HISTORY — DX: Other specified disorders of bone density and structure, unspecified site: M85.80

## 2019-01-17 HISTORY — DX: Shortness of breath: R06.02

## 2019-01-17 HISTORY — PX: CATARACT EXTRACTION W/PHACO: SHX586

## 2019-01-17 HISTORY — DX: Dorsalgia, unspecified: M54.9

## 2019-01-17 HISTORY — DX: Other abnormalities of gait and mobility: R26.89

## 2019-01-17 HISTORY — DX: Cardiomegaly: I51.7

## 2019-01-17 SURGERY — PHACOEMULSIFICATION, CATARACT, WITH IOL INSERTION
Anesthesia: Monitor Anesthesia Care | Site: Eye | Laterality: Left

## 2019-01-17 MED ORDER — LIDOCAINE HCL (PF) 2 % IJ SOLN
INTRAOCULAR | Status: DC | PRN
  Administered 2019-01-17: 1 mL via INTRAOCULAR

## 2019-01-17 MED ORDER — MOXIFLOXACIN HCL 0.5 % OP SOLN
OPHTHALMIC | Status: DC | PRN
  Administered 2019-01-17: 0.2 mL via OPHTHALMIC

## 2019-01-17 MED ORDER — ONDANSETRON HCL 4 MG/2ML IJ SOLN: 4.0000 mg | Freq: Once | INTRAMUSCULAR | Status: DC | PRN

## 2019-01-17 MED ORDER — EPINEPHRINE PF 1 MG/ML IJ SOLN
INTRAOCULAR | Status: DC | PRN
  Administered 2019-01-17: 74 mL via OPHTHALMIC

## 2019-01-17 MED ORDER — TETRACAINE HCL 0.5 % OP SOLN
1.0000 [drp] | OPHTHALMIC | Status: DC | PRN
  Administered 2019-01-17 (×3): 1 [drp] via OPHTHALMIC

## 2019-01-17 MED ORDER — SODIUM HYALURONATE 10 MG/ML IO SOLN
INTRAOCULAR | Status: DC | PRN
  Administered 2019-01-17: 0.55 mL via INTRAOCULAR

## 2019-01-17 MED ORDER — SODIUM HYALURONATE 23 MG/ML IO SOLN
INTRAOCULAR | Status: DC | PRN
  Administered 2019-01-17: 0.6 mL via INTRAOCULAR

## 2019-01-17 MED ORDER — ARMC OPHTHALMIC DILATING DROPS
1.0000 "application " | OPHTHALMIC | Status: DC | PRN
  Administered 2019-01-17 (×3): 1 via OPHTHALMIC

## 2019-01-17 MED ORDER — MIDAZOLAM HCL 2 MG/2ML IJ SOLN
INTRAMUSCULAR | Status: DC | PRN
  Administered 2019-01-17: 1 mg via INTRAVENOUS

## 2019-01-17 MED ORDER — FENTANYL CITRATE (PF) 100 MCG/2ML IJ SOLN
INTRAMUSCULAR | Status: DC | PRN
  Administered 2019-01-17: 50 ug via INTRAVENOUS

## 2019-01-17 SURGICAL SUPPLY — 19 items
CANNULA ANT/CHMB 27G (MISCELLANEOUS) ×2 IMPLANT
CANNULA ANT/CHMB 27GA (MISCELLANEOUS) ×6 IMPLANT
DISSECTOR HYDRO NUCLEUS 50X22 (MISCELLANEOUS) ×3 IMPLANT
GLOVE SURG LX 7.5 STRW (GLOVE) ×2
GLOVE SURG LX STRL 7.5 STRW (GLOVE) ×1 IMPLANT
GLOVE SURG SYN 8.5  E (GLOVE) ×2
GLOVE SURG SYN 8.5 E (GLOVE) ×1 IMPLANT
GLOVE SURG SYN 8.5 PF PI (GLOVE) ×1 IMPLANT
GOWN STRL REUS W/ TWL LRG LVL3 (GOWN DISPOSABLE) ×2 IMPLANT
GOWN STRL REUS W/TWL LRG LVL3 (GOWN DISPOSABLE) ×6
LENS IOL TECNIS ITEC 18.0 (Intraocular Lens) ×2 IMPLANT
MARKER SKIN DUAL TIP RULER LAB (MISCELLANEOUS) ×3 IMPLANT
PACK DR. KING ARMS (PACKS) ×3 IMPLANT
PACK EYE AFTER SURG (MISCELLANEOUS) ×3 IMPLANT
PACK OPTHALMIC (MISCELLANEOUS) ×3 IMPLANT
SYR 3ML LL SCALE MARK (SYRINGE) ×3 IMPLANT
SYR TB 1ML LUER SLIP (SYRINGE) ×3 IMPLANT
WATER STERILE IRR 250ML POUR (IV SOLUTION) ×3 IMPLANT
WIPE NON LINTING 3.25X3.25 (MISCELLANEOUS) ×3 IMPLANT

## 2019-01-17 NOTE — H&P (Signed)

## 2019-01-17 NOTE — Anesthesia Preprocedure Evaluation (Signed)
Anesthesia Evaluation   Patient awake    History of Anesthesia Complications Negative for: history of anesthetic complications  Airway Mallampati: II  TM Distance: >3 FB Neck ROM: Full    Dental  (+) Upper Dentures   Pulmonary neg pulmonary ROS,    Pulmonary exam normal        Cardiovascular hypertension, Normal cardiovascular exam+ dysrhythmias Atrial Fibrillation   07/2018 normal echo   Neuro/Psych negative neurological ROS  negative psych ROS   GI/Hepatic negative GI ROS, Neg liver ROS,   Endo/Other  negative endocrine ROS  Renal/GU negative Renal ROS     Musculoskeletal   Abdominal   Peds  Hematology negative hematology ROS (+)   Anesthesia Other Findings   Reproductive/Obstetrics                             Anesthesia Physical Anesthesia Plan  ASA: II  Anesthesia Plan: MAC   Post-op Pain Management:    Induction: Intravenous  PONV Risk Score and Plan:   Airway Management Planned: Nasal Cannula  Additional Equipment: None  Intra-op Plan:   Post-operative Plan:   Informed Consent: I have reviewed the patients History and Physical, chart, labs and discussed the procedure including the risks, benefits and alternatives for the proposed anesthesia with the patient or authorized representative who has indicated his/her understanding and acceptance.       Plan Discussed with:   Anesthesia Plan Comments:         Anesthesia Quick Evaluation

## 2019-01-17 NOTE — Op Note (Signed)
OPERATIVE NOTE  [name] [id number] 01/17/2019   PREOPERATIVE DIAGNOSIS:  Nuclear sclerotic cataract left eye.  H25.12   POSTOPERATIVE DIAGNOSIS:    Nuclear sclerotic cataract left eye.     PROCEDURE:  Phacoemusification with posterior chamber intraocular lens placement of the left eye   LENS:   Implant Name Type Inv. Item Serial No. Manufacturer Lot No. LRB No. Used Action  LENS IOL DIOP 18.0 - [id number] Intraocular Lens LENS IOL DIOP 18.0 [id number] AMO  Left 1 Implanted       PCB00 +18.0   ULTRASOUND TIME: 0 minutes 44 seconds.  CDE 3.92   SURGEON:  Benay Pillow, MD, MPH   ANESTHESIA:  Topical with tetracaine drops augmented with 1% preservative-free intracameral lidocaine.  ESTIMATED BLOOD LOSS: <1 mL   COMPLICATIONS:  None.   DESCRIPTION OF PROCEDURE:  The patient was identified in the holding room and transported to the operating room and placed in the supine position under the operating microscope.  The left eye was identified as the operative eye and it was prepped and draped in the usual sterile ophthalmic fashion.   A 1.0 millimeter clear-corneal paracentesis was made at the 5:00 position. 0.5 ml of preservative-free 1% lidocaine with epinephrine was injected into the anterior chamber.  The anterior chamber was filled with Healon 5 viscoelastic.  A 2.4 millimeter keratome was used to make a near-clear corneal incision at the 2:00 position.  A curvilinear capsulorrhexis was made with a cystotome and capsulorrhexis forceps.  Balanced salt solution was used to hydrodissect and hydrodelineate the nucleus.   Phacoemulsification was then used in stop and chop fashion to remove the lens nucleus and epinucleus.  The remaining cortex was then removed using the irrigation and aspiration handpiece. Healon was then placed into the capsular bag to distend it for lens placement.  A lens was then injected into the capsular bag.  The remaining viscoelastic was aspirated.   Wounds  were hydrated with balanced salt solution.  The anterior chamber was inflated to a physiologic pressure with balanced salt solution.  Intracameral vigamox 0.1 mL undiltued was injected into the eye and a drop placed onto the ocular surface.  No wound leaks were noted.  The patient was taken to the recovery room in stable condition without complications of anesthesia or surgery  Benay Pillow 01/17/2019, 12:04 PM

## 2019-01-17 NOTE — Transfer of Care (Signed)
Immediate Anesthesia Transfer of Care Note  Patient: Linda Johnson  Procedure(s) Performed: CATARACT EXTRACTION PHACO AND INTRAOCULAR LENS PLACEMENT ([place]) LEFT (Left Eye)  Patient Location: PACU  Anesthesia Type: MAC  Level of Consciousness: awake, alert  and patient cooperative  Airway and Oxygen Therapy: Patient Spontanous Breathing and Patient connected to supplemental oxygen  Post-op Assessment: Post-op Vital signs reviewed, Patient's Cardiovascular Status Stable, Respiratory Function Stable, Patent Airway and No signs of Nausea or vomiting  Post-op Vital Signs: Reviewed and stable  Complications: No apparent anesthesia complications

## 2019-01-17 NOTE — Anesthesia Procedure Notes (Signed)
Procedure Name: MAC Date/Time: 01/17/2019 11:41 AM Performed by: Cameron Ali, CRNA Pre-anesthesia Checklist: Patient identified, Emergency Drugs available, Suction available, Timeout performed and Patient being monitored Patient Re-evaluated:Patient Re-evaluated prior to induction Oxygen Delivery Method: Nasal cannula Placement Confirmation: positive ETCO2

## 2019-01-17 NOTE — Anesthesia Postprocedure Evaluation (Signed)
Anesthesia Post Note  Patient: Linda Johnson  Procedure(s) Performed: CATARACT EXTRACTION PHACO AND INTRAOCULAR LENS PLACEMENT ([place]) LEFT (Left Eye)  Patient location during evaluation: PACU Anesthesia Type: MAC Level of consciousness: awake and alert Pain management: pain level controlled Vital Signs Assessment: post-procedure vital signs reviewed and stable Respiratory status: spontaneous breathing, nonlabored ventilation, respiratory function stable and patient connected to nasal cannula oxygen Cardiovascular status: stable and blood pressure returned to baseline Postop Assessment: no apparent nausea or vomiting Anesthetic complications: no    Adele Barthel Josselyn Harkins

## 2019-01-18 ENCOUNTER — Encounter: Payer: Self-pay | Admitting: Ophthalmology

## 2019-01-27 ENCOUNTER — Telehealth: Payer: Self-pay

## 2019-01-27 ENCOUNTER — Other Ambulatory Visit: Payer: Self-pay | Admitting: Family Medicine

## 2019-01-27 DIAGNOSIS — Z1231 Encounter for screening mammogram for malignant neoplasm of breast: Secondary | ICD-10-CM

## 2019-01-27 NOTE — Telephone Encounter (Signed)
Pt got recorded message off her answering machine yesterday and pt could not understand the message and at the end of the message it said if any questions to call [facility]. Pt has erased the message and pt said she has no upcoming appts. Pt wants to know what the call was about. I advised pt that we do not send out the automated calls from our office and I am not sure what the recording was about. Pt asked me to send note to Dr Marliss Coots CMA to see if she has any idea of what the automated call was about.

## 2019-01-27 NOTE — Telephone Encounter (Signed)
I haven't called or left a VM on pt's phone

## 2019-01-31 ENCOUNTER — Other Ambulatory Visit: Payer: Self-pay | Admitting: Cardiovascular Disease

## 2019-01-31 ENCOUNTER — Other Ambulatory Visit: Payer: Self-pay

## 2019-02-23 ENCOUNTER — Other Ambulatory Visit: Payer: Self-pay | Admitting: Family Medicine

## 2019-03-01 ENCOUNTER — Ambulatory Visit (INDEPENDENT_AMBULATORY_CARE_PROVIDER_SITE_OTHER): Payer: Medicare Other

## 2019-03-01 DIAGNOSIS — Z23 Encounter for immunization: Secondary | ICD-10-CM | POA: Diagnosis not present

## 2019-04-03 ENCOUNTER — Other Ambulatory Visit: Payer: Self-pay | Admitting: Cardiovascular Disease

## 2019-04-04 IMAGING — DX DG FOOT COMPLETE 3+V*L*
3 series · 3 of 3 positions shown · non-contrast
Comparison: None

CLINICAL DATA: Pain at shaft of LEFT fifth metatarsal, no trauma

EXAM:
LEFT FOOT - COMPLETE 3+ VIEW

[foot ap]
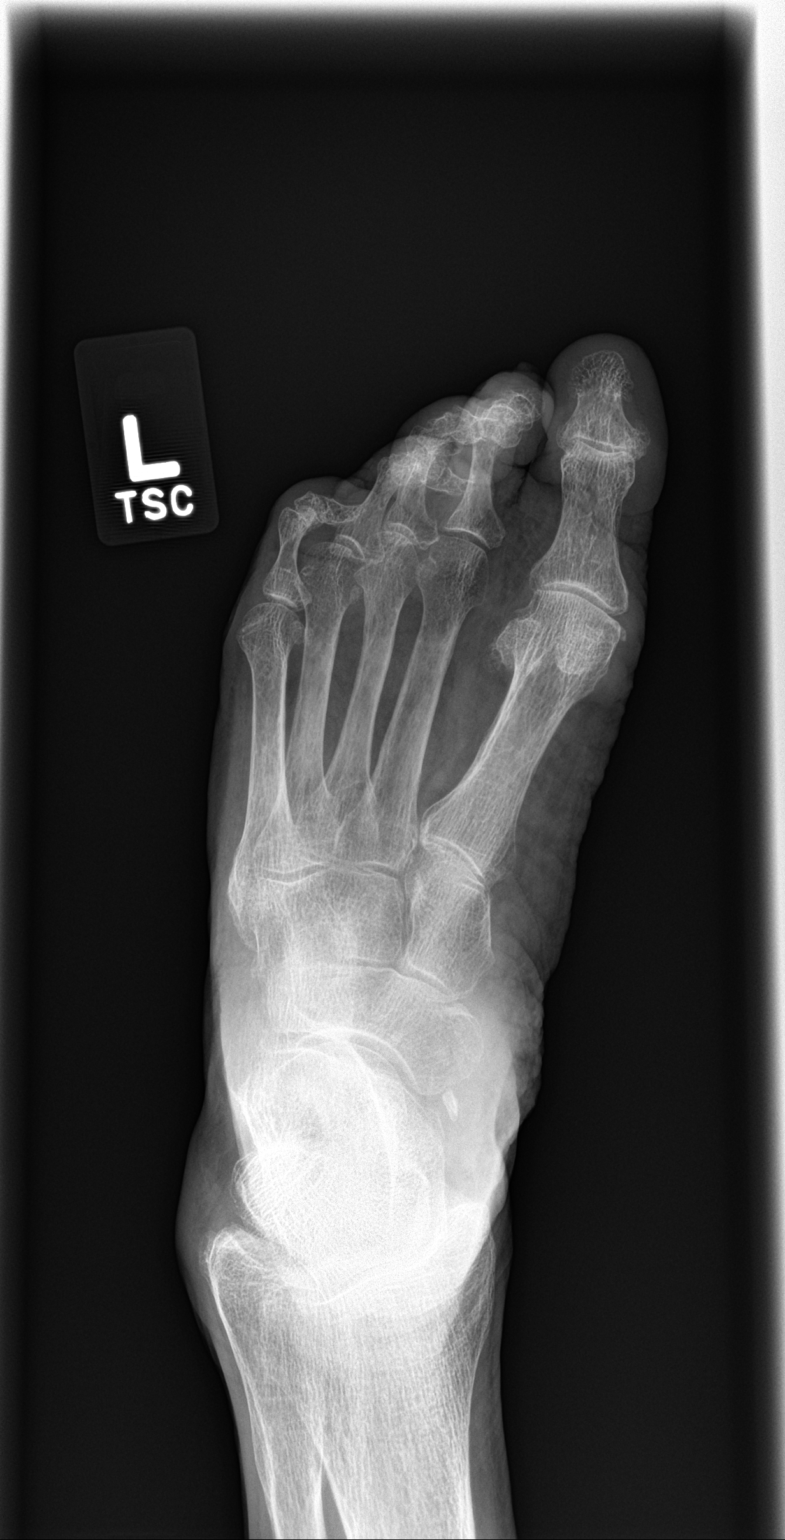

[foot obl]
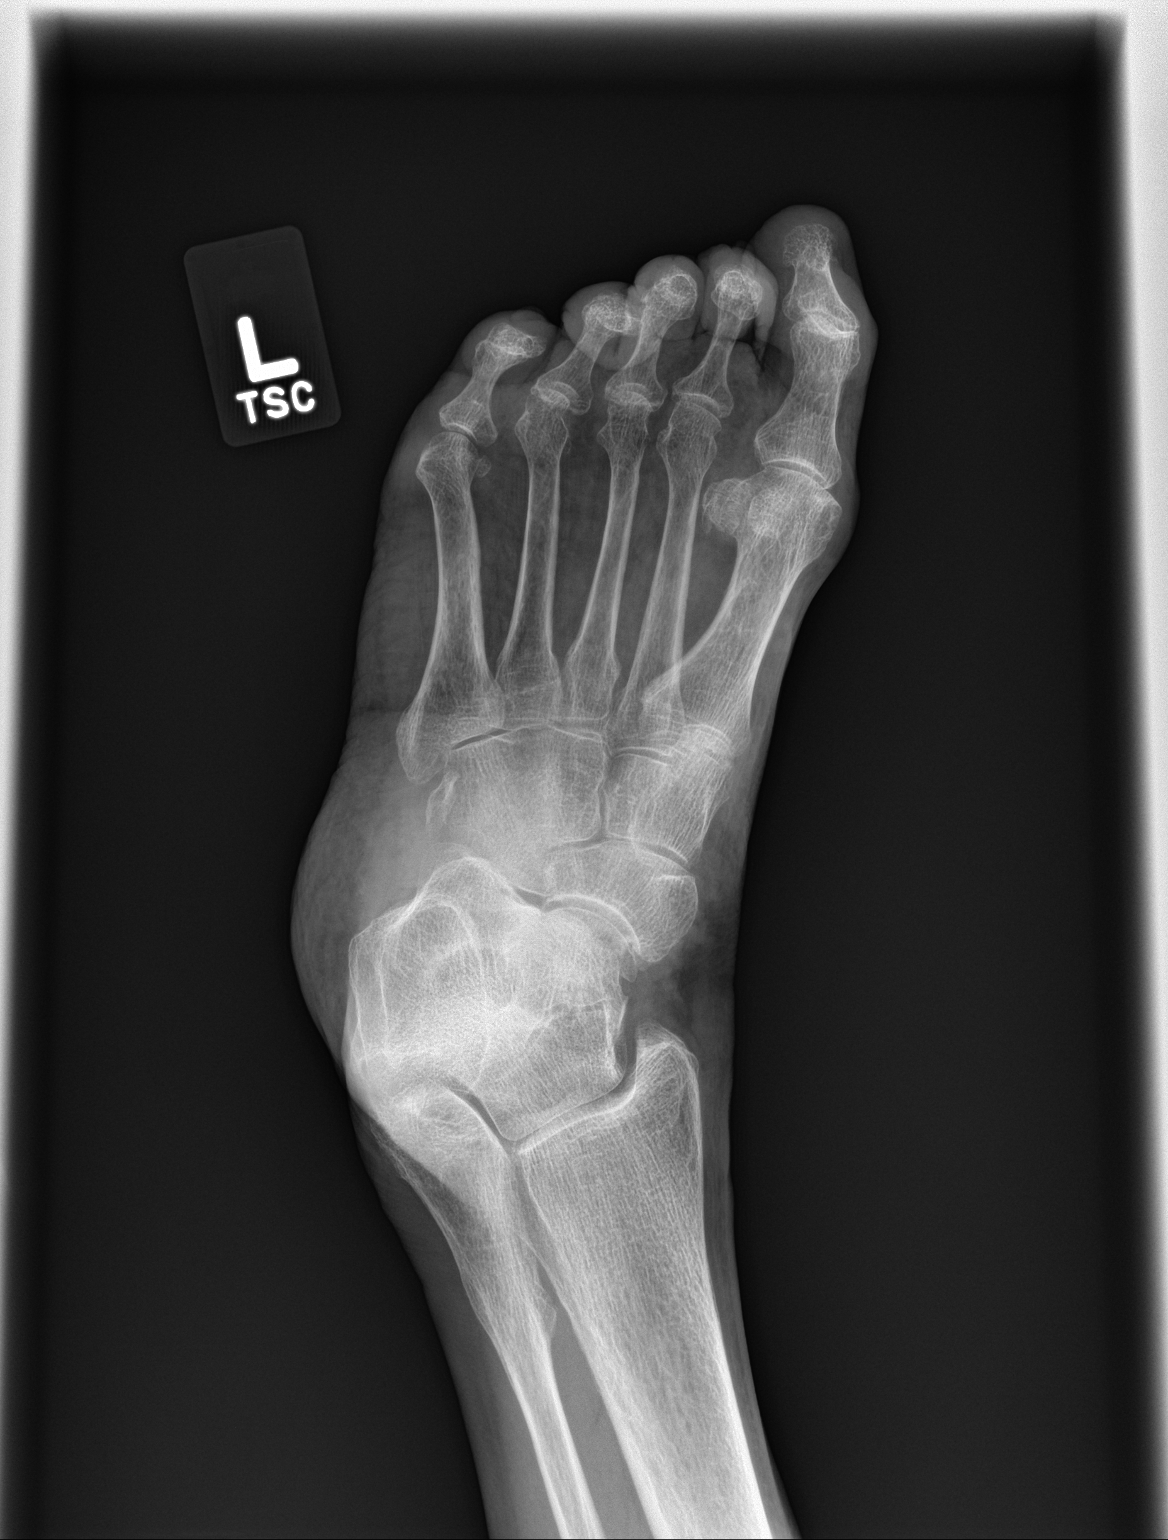

[foot lat]
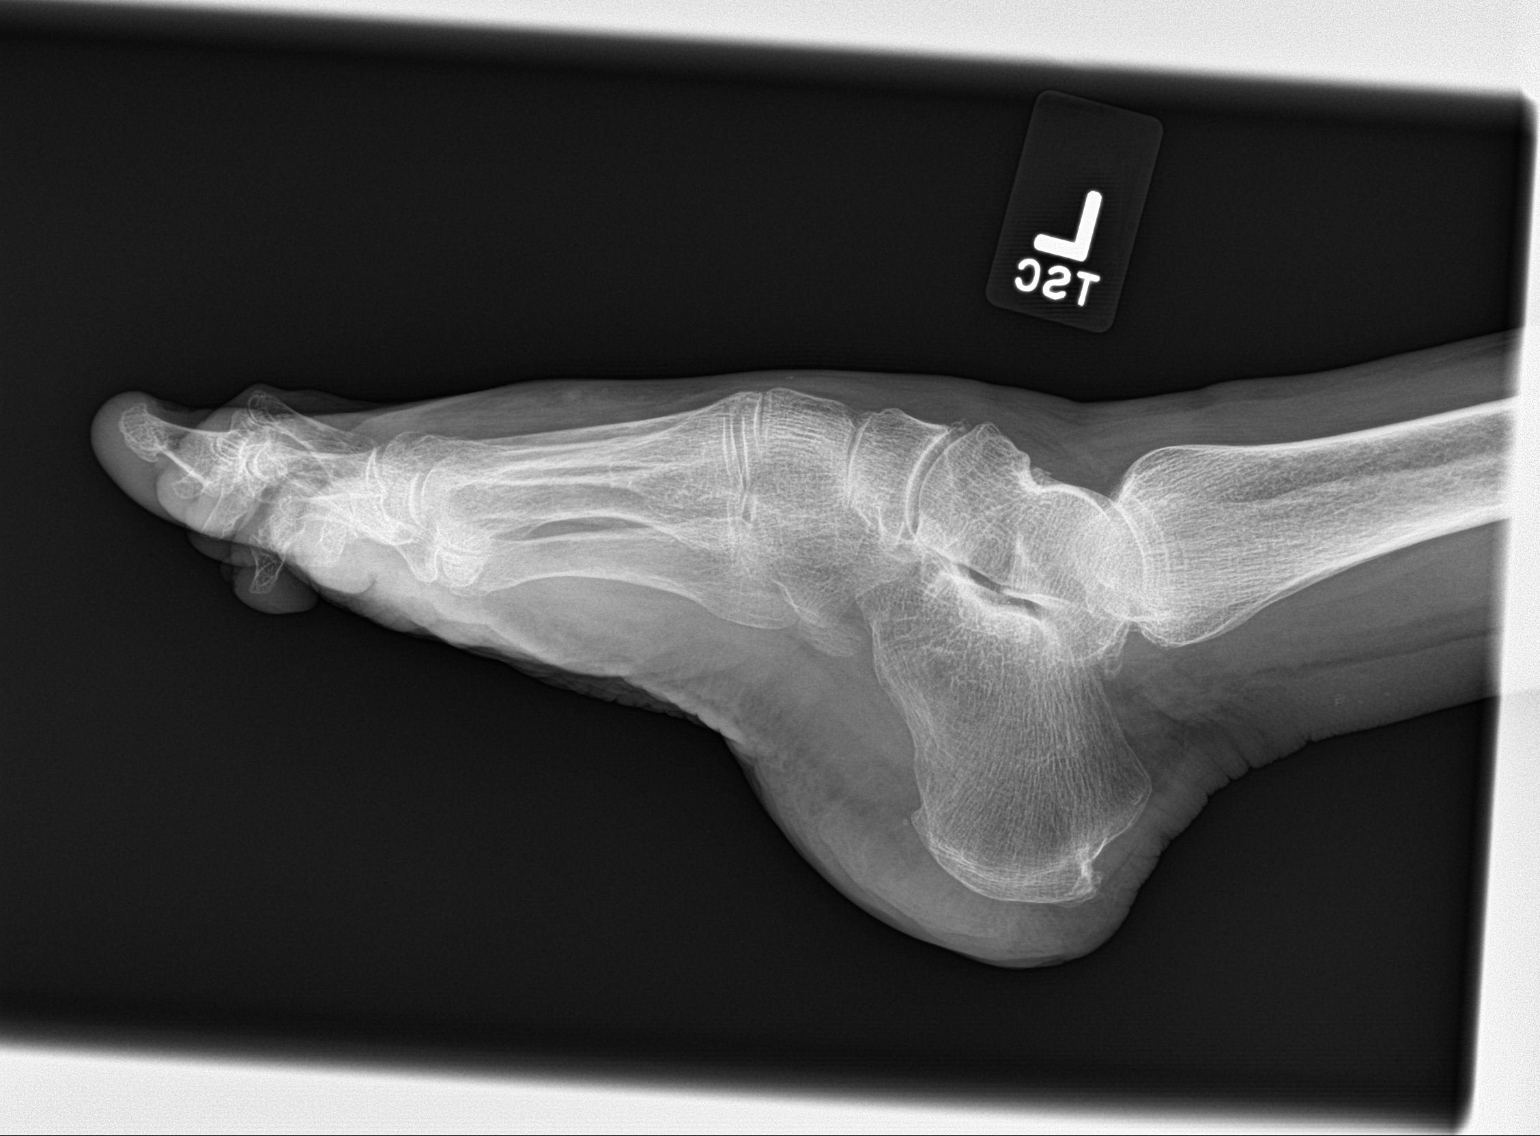

[3 of 3 positions shown; findings below may reference images not displayed]

FINDINGS: Marked diffuse osseous demineralization.

Joint spaces preserved.

No acute fracture, dislocation, or bone destruction.
IMPRESSION: Osseous demineralization without acute bony abnormalities.

## 2019-04-04 NOTE — Telephone Encounter (Signed)
Refill request

## 2019-04-04 NOTE — Telephone Encounter (Signed)
Pt's age 83, wt 75.3 kg, SCr 0.94, CrCl 56.74, last ov w/ TG 08/30/18. May have to reduce dosage if wt or CrCl go any lower.

## 2019-04-06 ENCOUNTER — Telehealth: Payer: Self-pay | Admitting: *Deleted

## 2019-04-06 NOTE — Telephone Encounter (Signed)
Called pt and she said that her [name] did attach a picture with the mychart she wants [name] to look at. ( I can't move the pic to pt's chart). Pt said that she is still dealing with the back pain. She said she had left over gabepentin from when she did have shingles last time and she has been taking one pill every 6 hrs and that is helping with the pain some. Pt wanted to know if [name] thinks this is a reaction to the shingles shot and if so should she get the 2nd dose or does she think this is shingles flaring up again. Pt can't see if her rash is worse but she said she wants [name] to look at the picture she sent with the mychart message. If [name] thinks its shingles again should she get med to help sxs [facility]

## 2019-04-06 NOTE — Telephone Encounter (Signed)
Pt notified of Dr. Marliss Coots comments and instructions. Pt said that she has enough gabapentin so she doesn't need a refill right now. Pt will give it a few days and if sxs don't improve she will call back and schedule an appt

## 2019-04-06 NOTE — Telephone Encounter (Signed)
It is hard to tell from the picture-is it the little red dots she is referring to?  I do not see anything resembling shingles   The shingrix vaccine is not live so should not cause a shingles breakout  It can cause redness/itching at injection site  I doubt this is related to the vaccine   Gabapentin is fine for the back pain  Let me know if she needs a refill

## 2019-04-06 NOTE — Telephone Encounter (Signed)
Daugher put in her mychart:  This is for Cameron Sprang  She got the shingles shot on 9/22  Backache started 10/2  Now has rash

## 2019-06-21 ENCOUNTER — Telehealth: Payer: Self-pay | Admitting: Family Medicine

## 2019-06-21 MED ORDER — GABAPENTIN 100 MG PO CAPS: 100.0000 mg | ORAL_CAPSULE | Freq: Three times a day (TID) | ORAL | 3 refills | Status: DC | PRN

## 2019-06-21 NOTE — Telephone Encounter (Signed)
Pt was unaware that the hospital discontinue med. Pt said that she has only been using them PRN, and it's not something she takes on a regular. Pt said she took the a few months back when she thought she had shingles for a few days. Then she took them when she had the shingrix vaccine to help with the arm pain. Now pt is taking them occasionally when she has back pain. Pt likes to keep them on hand to use PRN and she is trying to get meds refilled before year end to have on hand.  [facility]  Last Rx was for gabapentin 100 mg #180 caps with directions of taking 2 caps po TID (but pt said she doesn't use that much)

## 2019-06-21 NOTE — Telephone Encounter (Signed)
I sent for a lesser amount with changed directions Please use caution with it

## 2019-06-21 NOTE — Telephone Encounter (Signed)
Patient is requesting a refill  She stated that the pharmacy advised her to call our office since they have sent this a couple times and have no received a response.  [facility]ABAPENTIN  [facility] [address] [employer]

## 2019-06-21 NOTE — Telephone Encounter (Signed)
See prev note, never received a refill request, med not on list anymore, it was discontinue by provider at hospital on 08/26/18 at discharge, please advise

## 2019-06-21 NOTE — Telephone Encounter (Signed)
Please call her and have her clarify  thanks

## 2019-08-09 ENCOUNTER — Other Ambulatory Visit: Payer: Self-pay | Admitting: Cardiovascular Disease

## 2019-08-09 NOTE — Telephone Encounter (Signed)
Please advise if we can refill Doxazosin 8mg . Last refilled by Dr. . Thanks

## 2019-09-05 ENCOUNTER — Other Ambulatory Visit: Payer: Self-pay | Admitting: Family Medicine

## 2019-09-21 ENCOUNTER — Ambulatory Visit
Admission: RE | Admit: 2019-09-21 | Discharge: 2019-09-21 | Disposition: A | Discharge status: Home / Self Care | Payer: Medicare Other | Source: Ambulatory Visit | Attending: Family Medicine | Admitting: Family Medicine

## 2019-09-21 DIAGNOSIS — Z1231 Encounter for screening mammogram for malignant neoplasm of breast: Secondary | ICD-10-CM

## 2019-09-27 ENCOUNTER — Telehealth: Payer: Self-pay | Admitting: *Deleted

## 2019-09-27 NOTE — Telephone Encounter (Signed)
Mammogram was negative  It was noted in the chart that a letter was mailed to her the day of the result

## 2019-09-27 NOTE — Telephone Encounter (Signed)
Patient left a voicemail stating that she had a mammogram on 09/21/19 and has not gotten the results yet. Patient is requesting a call back with the results.

## 2019-09-28 NOTE — Telephone Encounter (Signed)
Left VM letting pt know [name]'s comments  

## 2019-10-01 NOTE — Progress Notes (Signed)
Cardiology Office Note  Date:  10/03/2019   ID:  Linda Johnson, DOB 1935/10/01, MRN [id number]  PCP:  Abner Greenspan, MD   Chief Complaint  Patient presents with  . office visit    Pt has no complaints. Meds verbally reviewed w/ pt.    HPI:  Linda Johnson is a 84 y.o. woman with a hx of  atrial fibrillation, chronic, since 2017  On anticoagulation/eliquis hypertension who presents for routine follow-up of her  PVCs, atrial fibrillation  Had vaccine Feels well, overall no new complaints  No SOB, no regular exercise program BP elevated today from walk Bp at home 130s, Pulse unclear numbers, mildly elevated today  Weight up this year, 6-8 pounds Does take out, groceries delivered Less active  Labs reviewed HBA1C 5.6 Total chol 136, LDL 80  EKG personally reviewed by myself on todays visit Atrial fib rate 105 bpm pvcs  Other past medical history reviewed hospitalization, sent from primary care for INR of 7,  Reported worsening shortness of breath Treated for chronic diastolic CHF IV Lasix x2 doses Discharged on Lasix 20 daily Hospital records reviewed with the patient in detail At discharge was told to decrease her diltiazem down to 120 mg daily given bradycardia.  Warfarin was held in an effort to transition her to Eliquis  Since discharge from the hospital she has been very strict with her oral intake, she endorses weight loss, 5 pounds in the past 2 days. She is taking a dose dose of lasix which she started on Friday 08/27/18, but feels like it has not started working.  On Saturday 08/28/18 her weight was 173 lb and this morning it was 168.4 lb. Her heart rate has been higher recently, usually around 100  On a previous clinic visit, she was started on amiodarone in an attempt to cardiovert In follow-up she Declined cardioversion  Previously felt she had nausea from the low-dose amiodarone  Atrial fibrillation picked up by primary care on routine EKG early 2017   PMH:    has a past medical history of Arthritis, Arthritis, Atrial fibrillation ([place]), Back pain, Balance problem, Cardiomegaly, Cataract (2020), CHF (congestive heart failure) ([place]) (07/2018), Hypertension, Osteopenia, Pleural effusion (08/25/2018), Shingles (08/2017), and SOB (shortness of breath).  PSH:    Past Surgical History:  Procedure Laterality Date  . BREAST BIOPSY Left 2011   2 areas, cysts per pt  . CATARACT EXTRACTION W/PHACO Right 12/06/2018   Procedure: CATARACT EXTRACTION PHACO AND INTRAOCULAR LENS PLACEMENT ([place]) RIGHT;  Surgeon: Eulogio Bear, MD;  Location: [place];  Service: Ophthalmology;  Laterality: Right;  . CATARACT EXTRACTION W/PHACO Left 01/17/2019   Procedure: CATARACT EXTRACTION PHACO AND INTRAOCULAR LENS PLACEMENT ([place]) LEFT;  Surgeon: Eulogio Bear, MD;  Location: [place];  Service: Ophthalmology;  Laterality: Left;    Current Outpatient Medications  Medication Sig Dispense Refill  . calcium-vitamin D (OSCAL WITH D) 500-200 MG-UNIT per tablet Take 1 tablet by mouth 2 (two) times daily.    . Cholecalciferol (VITAMIN D3) 2000 UNITS TABS Take 1 tablet by mouth 2 (two) times daily.     Marland Kitchen diltiazem (CARDIZEM CD) 120 MG 24 hr capsule TAKE 1 CAPSULE (120 MG TOTAL) BY MOUTH DAILY. 30 capsule 6  . doxazosin (CARDURA) 8 MG tablet Take 0.5 tablets (4 mg total) by mouth 2 (two) times daily. 180 tablet 2  . ELIQUIS 5 MG TABS tablet TAKE 1 TABLET BY MOUTH TWICE A DAY 60 tablet 6  .  furosemide (LASIX) 20 MG tablet Take 1 tablet (20 mg) by mouth once daily for weight greater then 173 lbs    . gabapentin (NEURONTIN) 100 MG capsule Take 1 capsule (100 mg total) by mouth 3 (three) times daily as needed (back pain or shingles pain). Caution of sedation 30 capsule 3  . latanoprost (XALATAN) 0.005 % ophthalmic solution INSTILL ONE DROP IN BOTH EYES AT BEDTIME.  3  . losartan (COZAAR) 100 MG tablet TAKE 1 TABLET BY MOUTH EVERY DAY (Patient taking differently: 50  mg daily. ) 90 tablet 3  . Multiple Vitamins-Minerals (HAIR SKIN AND NAILS FORMULA) TABS Take 1 tablet by mouth daily.    . potassium chloride (KLOR-CON) 10 MEQ tablet TAKE 1 TABLET BY MOUTH EVERY DAY 90 tablet 1  . prednisoLONE acetate (PRED FORTE) 1 % ophthalmic suspension Place 1 drop into both eyes at bedtime.     No current facility-administered medications for this visit.     Allergies:   Ace inhibitors, Hydrocodone-homatropine, and Tramadol hcl   Social History:  The patient  reports that she has never smoked. She has never used smokeless tobacco. She reports that she does not drink alcohol or use drugs.   Family History:   family history includes Breast cancer (age of onset: 64) in her maternal aunt; Breast cancer (age of onset: 59) in her sister; Leukemia in her sister.    Review of Systems: Review of Systems  Constitutional:       Weight gain  HENT: Negative.   Eyes: Negative.   Respiratory: Negative.   Cardiovascular: Negative.   Gastrointestinal: Negative.   Genitourinary: Negative.   Musculoskeletal: Negative.   Neurological: Negative.   Psychiatric/Behavioral: Negative.   All other systems reviewed and are negative.    PHYSICAL EXAM: VS:  BP (!) 152/68 (BP Location: Left Arm, Patient Position: Sitting, Cuff Size: Normal)   Pulse (!) 105   Ht 5\' 5"  (1.651 m)   Wt 172 lb 8 oz (78.2 kg)   LMP  (LMP Unknown)   SpO2 98%   BMI 28.71 kg/m  , BMI Body mass index is 28.71 kg/m.  Constitutional:  oriented to person, place, and time. No distress.  HENT:  Head: Grossly normal Eyes:  no discharge. No scleral icterus.  Neck: No JVD, no carotid bruits  Cardiovascular: Regular rate and rhythm, no murmurs appreciated Pulmonary/Chest: Clear to auscultation bilaterally, no wheezes or rails Abdominal: Soft.  no distension.  no tenderness.  Musculoskeletal: Normal range of motion Neurological:  normal muscle tone. Coordination normal. No atrophy Skin: Skin warm and  dry Psychiatric: normal affect, pleasant   Recent Labs: 11/23/2018: ALT 8; BUN 18; Creatinine, Ser 0.94; Hemoglobin 14.1; Platelets 188.0; Potassium 4.4; Sodium 139; TSH 3.51    Lipid Panel Lab Results  Component Value Date   CHOL 136 11/23/2018   HDL 43.10 11/23/2018   LDLCALC 80 11/23/2018   TRIG 66.0 11/23/2018      Wt Readings from Last 3 Encounters:  10/03/19 172 lb 8 oz (78.2 kg)  01/17/19 166 lb (75.3 kg)  12/06/18 156 lb (70.8 kg)      ASSESSMENT AND PLAN:  Chronic diastolic CHF Recommend she stay on Lasix daily Appears relatively euvolemic on exam  Permanent atrial fibrillation (HCC)  Tolerating Eliquis 5 mg twice a day , Rate control with diltiazem 120 daily Rate mildly elevated today, she will call 02/05/19 with pulse numbers from home.  Long walk into the office today  Essential hypertension Blood pressure  mildly elevated, numbers from home are well controlled. She will call us with pulse rate numbers and blood pressure numbers  Mixed hyperlipidemia  cholesterol has typically not been an issue in the past, currently not on a statin  Gait instability Recommend regular walking program  PVCs Plan: asymptomatic No further work-up  Shortness of breath From atrial fibrillation, deconditioning, weight gain    Total encounter time more than 25 minutes  Greater than 50% was spent in counseling and coordination of care with the patient   Disposition:   F/U  6 months    Signed, Dossie Arbour, M.D., Ph.D. 10/03/2019  [facility] [place], [place] [phone]

## 2019-10-03 ENCOUNTER — Ambulatory Visit (INDEPENDENT_AMBULATORY_CARE_PROVIDER_SITE_OTHER): Payer: Medicare Other | Admitting: Cardiovascular Disease

## 2019-10-03 ENCOUNTER — Encounter: Payer: Self-pay | Admitting: Cardiovascular Disease

## 2019-10-03 ENCOUNTER — Other Ambulatory Visit: Payer: Self-pay

## 2019-10-03 VITALS — BP 152/68 | HR 105 | Ht 65.0 in | Wt 172.5 lb

## 2019-10-03 DIAGNOSIS — E782 Mixed hyperlipidemia: Secondary | ICD-10-CM | POA: Diagnosis not present

## 2019-10-03 DIAGNOSIS — I10 Essential (primary) hypertension: Secondary | ICD-10-CM

## 2019-10-03 DIAGNOSIS — I5032 Chronic diastolic (congestive) heart failure: Secondary | ICD-10-CM

## 2019-10-03 DIAGNOSIS — I493 Ventricular premature depolarization: Secondary | ICD-10-CM

## 2019-10-03 DIAGNOSIS — I482 Chronic atrial fibrillation, unspecified: Secondary | ICD-10-CM

## 2019-10-03 NOTE — Patient Instructions (Signed)
Please call the office with blood pressure and pulse rate numbers 2 weeks  Pulse is running high today   Medication Instructions:  No changes  If you need a refill on your cardiac medications before your next appointment, please call your pharmacy.    Lab work: No new labs needed   If you have labs (blood work) drawn today and your tests are completely normal, you will receive your results only by: Marland Kitchen MyChart Message (if you have MyChart) OR . A paper copy in the mail If you have any lab test that is abnormal or we need to change your treatment, we will call you to review the results.   Testing/Procedures: No new testing needed   Follow-Up: At [facility], you and your health needs are our priority.  As part of our continuing mission to provide you with exceptional heart care, we have created designated Provider Care Teams.  These Care Teams include your primary Cardiologist (physician) and Advanced Practice Providers (APPs -  Physician Assistants and Nurse Practitioners) who all work together to provide you with the care you need, when you need it.  . You will need a follow up appointment in 6 months   . Providers on your designated Care Team:   . Nicolasa Ducking, NP . Eula Listen, PA-C . Marisue Ivan, PA-C  Any Other Special Instructions Will Be Listed Below (If Applicable).  For educational health videos Log in to : [email or web address] Or : [email or web address], password : [email or web address]

## 2019-10-17 ENCOUNTER — Telehealth: Payer: Self-pay | Admitting: Cardiovascular Disease

## 2019-10-17 NOTE — Telephone Encounter (Signed)
Patient calling back with BP log   4/7 180/79  83  4/8 152/71  80  4/9 146/77  78  4/10 148/76  74  4/11 147/78  76  4/12 143/93  72   4/13 145/76  78  4/14 145/73  69  4/15 144/77  75  4/16 142/84  83  4/17 143/69  68  4/18 146/72  83  4/19 136/71  78

## 2019-10-19 MED ORDER — METOPROLOL SUCCINATE ER 25 MG PO TB24: 25.0000 mg | ORAL_TABLET | Freq: Every day | ORAL | 3 refills | Status: DC

## 2019-10-19 NOTE — Telephone Encounter (Signed)
Call to patient to review POC from Dr. Mariah Milling. Pt verbalized understanding and had no further questions at this time. rx ordered.

## 2019-10-19 NOTE — Telephone Encounter (Signed)
Blood pressure and heart rate can we start metoprolol succinate 25 mg daily Continue to monitor blood pressure

## 2019-10-26 ENCOUNTER — Other Ambulatory Visit: Payer: Self-pay

## 2019-10-26 ENCOUNTER — Other Ambulatory Visit: Payer: Self-pay | Admitting: Cardiovascular Disease

## 2019-10-26 DIAGNOSIS — Z7901 Long term (current) use of anticoagulants: Secondary | ICD-10-CM

## 2019-10-26 NOTE — Telephone Encounter (Signed)
Pt's age 84, wt 78.2 kg, last labs from 12/2018 show SCr 0.94, CrCl 57.95. Pt needs labs prior to next refill to assess kidney fx.  Order placed so she can schedule at her convenience.

## 2019-10-26 NOTE — Telephone Encounter (Signed)
Refill request for Eliquis

## 2019-10-26 NOTE — Progress Notes (Signed)
Bet  

## 2019-11-03 ENCOUNTER — Other Ambulatory Visit: Payer: Self-pay | Admitting: Cardiovascular Disease

## 2019-11-11 ENCOUNTER — Other Ambulatory Visit: Payer: Self-pay | Admitting: Family Medicine

## 2019-11-23 ENCOUNTER — Telehealth: Payer: Self-pay | Admitting: Family Medicine

## 2019-11-23 DIAGNOSIS — E039 Hypothyroidism, unspecified: Secondary | ICD-10-CM

## 2019-11-23 DIAGNOSIS — M810 Age-related osteoporosis without current pathological fracture: Secondary | ICD-10-CM

## 2019-11-23 DIAGNOSIS — E782 Mixed hyperlipidemia: Secondary | ICD-10-CM

## 2019-11-23 DIAGNOSIS — I10 Essential (primary) hypertension: Secondary | ICD-10-CM

## 2019-11-23 DIAGNOSIS — R7303 Prediabetes: Secondary | ICD-10-CM

## 2019-11-23 NOTE — Telephone Encounter (Signed)
-----   Message from Alvina Chou sent at 11/09/2019 10:37 AM EDT ----- Regarding: lab orders for Thursday, 5.27.21 Patient is scheduled for CPX labs, please order future labs, Thanks , Camelia Eng

## 2019-11-24 ENCOUNTER — Other Ambulatory Visit (INDEPENDENT_AMBULATORY_CARE_PROVIDER_SITE_OTHER): Payer: Medicare Other

## 2019-11-24 ENCOUNTER — Telehealth: Payer: Self-pay

## 2019-11-24 DIAGNOSIS — E039 Hypothyroidism, unspecified: Secondary | ICD-10-CM | POA: Diagnosis not present

## 2019-11-24 DIAGNOSIS — R7303 Prediabetes: Secondary | ICD-10-CM

## 2019-11-24 DIAGNOSIS — E782 Mixed hyperlipidemia: Secondary | ICD-10-CM

## 2019-11-24 DIAGNOSIS — I10 Essential (primary) hypertension: Secondary | ICD-10-CM

## 2019-11-24 DIAGNOSIS — M810 Age-related osteoporosis without current pathological fracture: Secondary | ICD-10-CM | POA: Diagnosis not present

## 2019-11-24 LAB — HEMOGLOBIN A1C: Hgb A1c MFr Bld: 5.5 % (ref 4.6–6.5)

## 2019-11-24 LAB — COMPREHENSIVE METABOLIC PANEL
ALT: 11 U/L (ref 0–35)
AST: 16 U/L (ref 0–37)
Albumin: 4.5 g/dL (ref 3.5–5.2)
Alkaline Phosphatase: 63 U/L (ref 39–117)
BUN: 13 mg/dL (ref 6–23)
CO2: 31 mEq/L (ref 19–32)
Calcium: 9.8 mg/dL (ref 8.4–10.5)
Chloride: 100 mEq/L (ref 96–112)
Creatinine, Ser: 1.01 mg/dL (ref 0.40–1.20)
GFR: 52.52 mL/min — ABNORMAL LOW (ref >60.00)
Glucose, Bld: 131 mg/dL — ABNORMAL HIGH (ref 70–99)
Potassium: 4.2 mEq/L (ref 3.5–5.1)
Sodium: 138 mEq/L (ref 135–145)
Total Bilirubin: 1 mg/dL (ref 0.2–1.2)
Total Protein: 7.8 g/dL (ref 6.0–8.3)

## 2019-11-24 LAB — CBC WITH DIFFERENTIAL/PLATELET
Basophils Absolute: 0 10*3/uL (ref 0.0–0.1)
Basophils Relative: 0.4 % (ref 0.0–3.0)
Eosinophils Absolute: 0.1 10*3/uL (ref 0.0–0.7)
Eosinophils Relative: 1 % (ref 0.0–5.0)
HCT: 46.3 % — ABNORMAL HIGH (ref 36.0–46.0)
Hemoglobin: 15.4 g/dL — ABNORMAL HIGH (ref 12.0–15.0)
Lymphocytes Relative: 24.3 % (ref 12.0–46.0)
Lymphs Abs: 1.5 10*3/uL (ref 0.7–4.0)
MCHC: 33.3 g/dL (ref 30.0–36.0)
MCV: 92.5 fl (ref 78.0–100.0)
Monocytes Absolute: 0.4 10*3/uL (ref 0.1–1.0)
Monocytes Relative: 5.6 % (ref 3.0–12.0)
Neutro Abs: 4.4 10*3/uL (ref 1.4–7.7)
Neutrophils Relative %: 68.7 % (ref 43.0–77.0)
Platelets: 158 10*3/uL (ref 150.0–400.0)
RBC: 5.01 Mil/uL (ref 3.87–5.11)
RDW: 13.5 % (ref 11.5–15.5)
WBC: 6.4 10*3/uL (ref 4.0–10.5)

## 2019-11-24 LAB — LIPID PANEL
Cholesterol: 143 mg/dL (ref 0–200)
HDL: 47.2 mg/dL (ref >39.00)
LDL Cholesterol: 77 mg/dL (ref 0–99)
NonHDL: 95.42
Total CHOL/HDL Ratio: 3
Triglycerides: 91 mg/dL (ref 0.0–149.0)
VLDL: 18.2 mg/dL (ref 0.0–40.0)

## 2019-11-24 LAB — VITAMIN D 25 HYDROXY (VIT D DEFICIENCY, FRACTURES): VITD: 113.25 ng/mL (ref 30.00–100.00)

## 2019-11-24 LAB — TSH: TSH: 5.03 u[IU]/mL — ABNORMAL HIGH (ref 0.35–4.50)

## 2019-11-24 NOTE — Telephone Encounter (Signed)
Please have her stop any extra vit D.   Routed to PCP about follow up on this o/w.   Thanks.

## 2019-11-24 NOTE — Telephone Encounter (Signed)
[facility] called critical results @ 1448  Vit D 113.25

## 2019-11-24 NOTE — Telephone Encounter (Signed)
Agree with advisement to stop D  We will discuss at her upcoming f/u

## 2019-11-29 ENCOUNTER — Other Ambulatory Visit: Payer: Self-pay

## 2019-11-29 ENCOUNTER — Encounter: Payer: Self-pay | Admitting: Family Medicine

## 2019-11-29 ENCOUNTER — Ambulatory Visit (INDEPENDENT_AMBULATORY_CARE_PROVIDER_SITE_OTHER): Payer: Medicare Other | Admitting: Family Medicine

## 2019-11-29 VITALS — BP 135/80 | HR 89 | Temp 96.9°F | Ht 63.0 in | Wt 170.5 lb

## 2019-11-29 DIAGNOSIS — M810 Age-related osteoporosis without current pathological fracture: Secondary | ICD-10-CM | POA: Diagnosis not present

## 2019-11-29 DIAGNOSIS — I4821 Permanent atrial fibrillation: Secondary | ICD-10-CM

## 2019-11-29 DIAGNOSIS — E039 Hypothyroidism, unspecified: Secondary | ICD-10-CM | POA: Diagnosis not present

## 2019-11-29 DIAGNOSIS — Z0000 Encounter for general adult medical examination without abnormal findings: Secondary | ICD-10-CM | POA: Insufficient documentation

## 2019-11-29 DIAGNOSIS — E782 Mixed hyperlipidemia: Secondary | ICD-10-CM

## 2019-11-29 DIAGNOSIS — R7303 Prediabetes: Secondary | ICD-10-CM

## 2019-11-29 DIAGNOSIS — I5032 Chronic diastolic (congestive) heart failure: Secondary | ICD-10-CM

## 2019-11-29 DIAGNOSIS — I10 Essential (primary) hypertension: Secondary | ICD-10-CM

## 2019-11-29 DIAGNOSIS — E2839 Other primary ovarian failure: Secondary | ICD-10-CM

## 2019-11-29 MED ORDER — LOSARTAN POTASSIUM 100 MG PO TABS: 100.0000 mg | ORAL_TABLET | Freq: Every day | ORAL | 3 refills | Status: DC

## 2019-11-29 MED ORDER — POTASSIUM CHLORIDE ER 10 MEQ PO TBCR: 10.0000 meq | EXTENDED_RELEASE_TABLET | Freq: Every day | ORAL | 3 refills | Status: DC

## 2019-11-29 NOTE — Assessment & Plan Note (Addendum)
dexa 4/18 reviewed Ordered  No falls or fx  Vit D is high- will cut back after holding for a mo to 2000 iu daily  Enc exercise  Pt is open to another 5 y of alendronate (open to doing this again)  Px sent-inst to alert if any side eff

## 2019-11-29 NOTE — Assessment & Plan Note (Signed)
Disc goals for lipids and reasons to control them Rev last labs with pt Rev low sat fat diet in detail Well controlled with diet currently

## 2019-11-29 NOTE — Assessment & Plan Note (Signed)
Continues metoprolol and eliquis Sees cardiology  No problems

## 2019-11-29 NOTE — Assessment & Plan Note (Signed)
Sub clinical Lab Results  Component Value Date   TSH 5.03 (H) 11/24/2019    Continue to watch /no clinical changes  Would like to avoid T4 replacement in the interest of minimizing tachycardia if possible

## 2019-11-29 NOTE — Progress Notes (Signed)
Subjective:    Patient ID: Linda Johnson, female    DOB: 14-Oct-1935, 84 y.o.   MRN: [id number]  This visit occurred during the SARS-CoV-2 public health emergency.  Safety protocols were in place, including screening questions prior to the visit, additional usage of staff PPE, and extensive cleaning of exam room while observing appropriate contact time as indicated for disinfecting solutions.    HPI  Pt presents for amw and review of chronic medical problems   I have personally reviewed the Medicare Annual Wellness questionnaire and have noted 1. The patient's medical and social history 2. Their use of alcohol, tobacco or illicit drugs 3. Their current medications and supplements 4. The patient's functional ability including ADL's, fall risks, home safety risks and hearing or visual             impairment. 5. Diet and physical activities 6. Evidence for depression or mood disorders  The patients weight, height, BMI have been recorded in the chart and visual acuity is per eye clinic.  I have made referrals, counseling and provided education to the patient based review of the above and I have provided the pt with a written personalized care plan for preventive services. Reviewed and updated provider list, see scanned forms.  See scanned forms.  Routine anticipatory guidance given to patient.  See health maintenance. covid status -immunized  Colon cancer screening-last colonoscopy 5/14 - declines any more colon cancer screening  Breast cancer screening   Mammogram 3/21 Self breast exam- no lumps or changes  Flu vaccine 9/20  Tetanus vaccine 10/07 -postponed for lack of ins coverage  Pneumovax-completed series  Zoster vaccine-had the shingrix series  Dexa 4/18 -osteoporosis stable from prior check  She has had a 5 y course of alendronate in the past  Falls-none (uses walker)  Fractures- none  Supplements- was taking 3000 iu twice daily  D level was high at 113 -told to hold it    Exercise -walks with a walker (30 minutes a day- also stretching/yoga)   Advance directive-as living will and poa  Cognitive function addressed- see scanned forms- and if abnormal then additional documentation follows.  No concerns about memory at all/takes care of her own affairs and lives alone  Still drives   PMH and [facility] reviewed  Care team includes myself for pcp and Dr Mariah Milling for cardiology Dr Brooke Dare for opth (cataract surgery)- had both eyes done-very happy   Meds, vitals, and allergies reviewed.   ROS: See HPI.  Otherwise negative.    Weight : Wt Readings from Last 3 Encounters:  11/29/19 170 lb 8 oz (77.3 kg)  10/03/19 172 lb 8 oz (78.2 kg)  01/17/19 166 lb (75.3 kg)  stable = trying to loose weight    30.20 kg/m    Hearing/vision:  Hearing Screening   125Hz  250Hz  500Hz  1000Hz  2000Hz  3000Hz  4000Hz  6000Hz  8000Hz   Right ear:   0 0 0  0    Left ear:   40 0 0  40    Vision Screening Comments: Pt has yearly eye exam in July 2020 next eye exam scheduled with Dr. in July 2021  Poor hearing-aware Not ready for an audiology eval yet  Does not think she could afford a hearing aide    HTN (w/history of CHF) bp is up on first check today  Dr put her on metoprolol - bp is starting to go down along with heart rate  No cp or palpitations or headaches or  edema  No side effects to medicines  BP Readings from Last 3 Encounters:  11/29/19 (!) 148/92  10/03/19 (!) 152/68  01/17/19 128/84     Re check BP: 135/80   Pulse Readings from Last 3 Encounters:  11/29/19 89  10/03/19 (!) 105  01/17/19 82    Hypothyroidism -subclinical  (no medication)  Pt has no clinical changes No change in energy level/ hair or skin/ edema and no tremor No more fatigue than usual  Lab Results  Component Value Date   TSH 5.03 (H) 11/24/2019       Prediabetes Lab Results  Component Value Date   HGBA1C 5.5 11/24/2019   Well controlled  She is trying to eat a lot better   More fruits and vegetables    Hyperlipidemia  Lab Results  Component Value Date   CHOL 143 11/24/2019   CHOL 136 11/23/2018   CHOL 126 10/02/2017   Lab Results  Component Value Date   HDL 47.20 11/24/2019   HDL 43.10 11/23/2018   HDL 50.60 10/02/2017   Lab Results  Component Value Date   LDLCALC 77 11/24/2019   LDLCALC 80 11/23/2018   LDLCALC 56 10/02/2017   Lab Results  Component Value Date   TRIG 91.0 11/24/2019   TRIG 66.0 11/23/2018   TRIG 101.0 10/02/2017   Lab Results  Component Value Date   CHOLHDL 3 11/24/2019   CHOLHDL 3 11/23/2018   CHOLHDL 2 10/02/2017   Lab Results  Component Value Date   LDLDIRECT 132.4 05/06/2010   LDLDIRECT 107.9 04/02/2007  eating better/using an air fryer  Also exercising    Other labs  Lab Results  Component Value Date   NA 138 11/24/2019   K 4.2 11/24/2019   CO2 31 11/24/2019   GLUCOSE 131 (H) 11/24/2019   BUN 13 11/24/2019   CREATININE 1.01 11/24/2019   CALCIUM 9.8 11/24/2019   GFRNONAA 45 (L) 08/26/2018   GFRAA 52 (L) 08/26/2018   Lab Results  Component Value Date   ALT 11 11/24/2019   AST 16 11/24/2019   ALKPHOS 63 11/24/2019   BILITOT 1.0 11/24/2019   Lab Results  Component Value Date   WBC 6.4 11/24/2019   HGB 15.4 (H) 11/24/2019   HCT 46.3 (H) 11/24/2019   MCV 92.5 11/24/2019   PLT 158.0 11/24/2019   Patient Active Problem List   Diagnosis Date Noted  . Medicare annual wellness visit, subsequent 11/29/2019  . Chronic diastolic CHF (congestive heart failure) (HCC) 08/30/2018  . Short of breath on exertion 08/23/2018  . Pedal edema 08/23/2018  . PVC (premature ventricular contraction) 01/18/2018  . Poor balance 10/05/2017  . Post herpetic neuralgia 09/23/2017  . Thoracic back pain 09/23/2017  . Encounter for therapeutic drug monitoring 09/24/2015  . Encounter for anticoagulation discussion and counseling 09/20/2015  . Irregular heart rate 09/17/2015  . Permanent atrial fibrillation (HCC)  09/17/2015  . Hearing loss 09/17/2015  . Hypothyroidism 12/12/2014  . Prediabetes 09/11/2014  . Encounter for Medicare annual wellness exam 09/11/2014  . Estrogen deficiency 09/11/2014  . Routine general medical examination at a health care facility 09/09/2013  . Low back pain 03/21/2013  . Other screening mammogram 04/19/2012  . Post-menopausal 04/19/2012  . Obesity 08/18/2011  . Hyperlipidemia 06/12/2010  . Osteoporosis 06/12/2010  . ALLERGIC RHINITIS CAUSE UNSPECIFIED 04/12/2008  . HX, PERSONAL, COLONIC POLYPS 04/02/2007  . STRABISMUS 03/02/2007  . Essential hypertension 03/02/2007   Past Medical History:  Diagnosis Date  . Arthritis  hips, knees  . Arthritis   . Atrial fibrillation (HCC)    chronic  . Back pain   . Balance problem   . Cardiomegaly   . Cataract 2020   Right eye December 06, 2018, Left eye date pending  . CHF (congestive heart failure) (HCC) 07/2018   chronic  . Hypertension   . Osteopenia   . Pleural effusion 08/25/2018   [facility]  . Shingles 08/2017   H/O  . SOB (shortness of breath)    Past Surgical History:  Procedure Laterality Date  . BREAST BIOPSY Left 2011   2 areas, cysts per pt  . CATARACT EXTRACTION W/PHACO Right 12/06/2018   Procedure: CATARACT EXTRACTION PHACO AND INTRAOCULAR LENS PLACEMENT (IOC) RIGHT;  Surgeon: Nevada CraneKing, Bradley Mark, MD;  [facility];  Service: Ophthalmology;  Laterality: Right;  . CATARACT EXTRACTION W/PHACO Left 01/17/2019   Procedure: CATARACT EXTRACTION PHACO AND INTRAOCULAR LENS PLACEMENT (IOC) LEFT;  Surgeon: Nevada CraneKing, Bradley Mark, MD;  [facility];  Service: Ophthalmology;  Laterality: Left;   Social History   Tobacco Use  . Smoking status: Never Smoker  . Smokeless tobacco: Never Used  Substance Use Topics  . Alcohol use: No    Alcohol/week: 0.0 standard drinks  . Drug use: No   Family History  Problem Relation Age of Onset  . Leukemia Sister   . Breast cancer Sister [id number]  .  Breast cancer Maternal Aunt 50   Allergies  Allergen Reactions  . Ace Inhibitors Cough       . Hydrocodone-Homatropine Nausea Only  . Tramadol Hcl Nausea Only        Current Outpatient Medications on File Prior to Visit  Medication Sig Dispense Refill  . calcium-vitamin D (OSCAL WITH D) 500-200 MG-UNIT per tablet Take 1 tablet by mouth 2 (two) times daily.    . Cholecalciferol (VITAMIN D3) 2000 UNITS TABS Take 1 tablet by mouth daily.     Marland Kitchen. diltiazem (CARDIZEM CD) 120 MG 24 hr capsule TAKE 1 CAPSULE BY MOUTH EVERY DAY 90 capsule 1  . doxazosin (CARDURA) 8 MG tablet Take 0.5 tablets (4 mg total) by mouth 2 (two) times daily. 180 tablet 2  . ELIQUIS 5 MG TABS tablet TAKE 1 TABLET BY MOUTH TWICE A DAY 60 tablet 3  . furosemide (LASIX) 20 MG tablet Take 1 tablet (20 mg) by mouth once daily for weight greater then 173 lbs    . latanoprost (XALATAN) 0.005 % ophthalmic solution INSTILL ONE DROP IN BOTH EYES AT BEDTIME.  3  . metoprolol succinate (TOPROL XL) 25 MG 24 hr tablet Take 1 tablet (25 mg total) by mouth daily. 90 tablet 3  . Multiple Vitamins-Minerals (HAIR SKIN AND NAILS FORMULA) TABS Take 1 tablet by mouth daily.    . prednisoLONE acetate (PRED FORTE) 1 % ophthalmic suspension Place 1 drop into both eyes at bedtime.     No current facility-administered medications on file prior to visit.    Review of Systems  Constitutional: Negative for activity change, appetite change, fatigue, fever and unexpected weight change.       No change in baseline fatigue/age related  HENT: Negative for congestion, ear pain, rhinorrhea, sinus pressure and sore throat.   Eyes: Negative for pain, redness and visual disturbance.  Respiratory: Negative for cough, shortness of breath and wheezing.   Cardiovascular: Negative for chest pain and palpitations.  Gastrointestinal: Negative for abdominal pain, blood in stool, constipation and diarrhea.  Endocrine: Negative for polydipsia  and polyuria.    Genitourinary: Negative for dysuria, frequency and urgency.  Musculoskeletal: Negative for arthralgias, back pain and myalgias.  Skin: Negative for pallor and rash.  Allergic/Immunologic: Negative for environmental allergies.  Neurological: Negative for dizziness, syncope and headaches.  Hematological: Negative for adenopathy. Does not bruise/bleed easily.  Psychiatric/Behavioral: Negative for decreased concentration and dysphoric mood. The patient is not nervous/anxious.        Objective:   Physical Exam Constitutional:      General: She is not in acute distress.    Appearance: Normal appearance. She is well-developed. She is obese. She is not ill-appearing or diaphoretic.  HENT:     Head: Normocephalic and atraumatic.     Right Ear: Tympanic membrane, ear canal and external ear normal.     Left Ear: Tympanic membrane, ear canal and external ear normal.     Nose: Nose normal. No congestion.     Mouth/Throat:     Mouth: Mucous membranes are moist.     Pharynx: Oropharynx is clear. No posterior oropharyngeal erythema.  Eyes:     General: No scleral icterus.    Extraocular Movements: Extraocular movements intact.     Conjunctiva/sclera: Conjunctivae normal.     Pupils: Pupils are equal, round, and reactive to light.  Neck:     Thyroid: No thyromegaly.     Vascular: No carotid bruit or JVD.  Cardiovascular:     Rate and Rhythm: Normal rate.     Pulses: Normal pulses.     Heart sounds: Normal heart sounds. No gallop.   Pulmonary:     Effort: Pulmonary effort is normal. No respiratory distress.     Breath sounds: Normal breath sounds. No wheezing.     Comments: Good air exch Chest:     Chest wall: No tenderness.  Abdominal:     General: Bowel sounds are normal. There is no distension or abdominal bruit.     Palpations: Abdomen is soft. There is no mass.     Tenderness: There is no abdominal tenderness.     Hernia: No hernia is present.  Genitourinary:    Comments: Breast  exam: No mass, nodules, thickening, tenderness, bulging, retraction, inflamation, nipple discharge or skin changes noted.  No axillary or clavicular LA.     Musculoskeletal:        General: No tenderness. Normal range of motion.     Cervical back: Normal range of motion and neck supple. No rigidity. No muscular tenderness.     Right lower leg: No edema.     Left lower leg: No edema.     Comments: Mild kyphosis   Lymphadenopathy:     Cervical: No cervical adenopathy.  Skin:    General: Skin is warm and dry.     Coloration: Skin is not pale.     Findings: No erythema or rash.     Comments: Solar lentigines diffusely Also sks   Neurological:     Mental Status: She is alert. Mental status is at baseline.     Cranial Nerves: No cranial nerve deficit.     Motor: No abnormal muscle tone.     Coordination: Coordination normal.     Gait: Gait normal.     Deep Tendon Reflexes: Reflexes are normal and symmetric. Reflexes normal.  Psychiatric:        Mood and Affect: Mood normal.        Cognition and Memory: Cognition and memory normal.  Assessment & Plan:   Problem List Items Addressed This Visit      Cardiovascular and Mediastinum   Essential hypertension    bp in fair control at this time  BP Readings from Last 1 Encounters:  11/29/19 135/80   No changes needed Most recent labs reviewed  Disc lifstyle change with low sodium diet and exercise        Relevant Medications   losartan (COZAAR) 100 MG tablet   Permanent atrial fibrillation (HCC)    Continues metoprolol and eliquis Sees cardiology  No problems      Relevant Medications   losartan (COZAAR) 100 MG tablet   Chronic diastolic CHF (congestive heart failure) (HCC)    Stable/under cardiac care No re occurrence per pt      Relevant Medications   losartan (COZAAR) 100 MG tablet     Endocrine   Hypothyroidism    Sub clinical Lab Results  Component Value Date   TSH 5.03 (H) 11/24/2019      Continue to watch /no clinical changes  Would like to avoid T4 replacement in the interest of minimizing tachycardia if possible         Musculoskeletal and Integument   Osteoporosis    dexa 4/18 reviewed Ordered  No falls or fx  Vit D is high- will cut back after holding for a mo to 2000 iu daily  Enc exercise  Pt is open to another 5 y of alendronate (open to doing this again)  Px sent-inst to alert if any side eff         Other   Hyperlipidemia    Disc goals for lipids and reasons to control them Rev last labs with pt Rev low sat fat diet in detail Well controlled with diet currently      Relevant Medications   losartan (COZAAR) 100 MG tablet   Prediabetes    Lab Results  Component Value Date   HGBA1C 5.5 11/24/2019   disc imp of low glycemic diet and wt loss to prevent DM2       Estrogen deficiency   Relevant Orders   DG Bone Density   Medicare annual wellness visit, subsequent - Primary    Reviewed health habits including diet and exercise and skin cancer prevention Reviewed appropriate screening tests for age  Also reviewed health mt list, fam hx and immunization status , as well as social and family history   See HPI Labs reviewed  Adv directive is utd  No cognitive concerns Hearing test confirms loss (she declines tx or audiol eval right now)  utd vision/eye exam  bp was better on 2nd check  Declines colon cancer screening  Has had shingrix vaccines

## 2019-11-29 NOTE — Patient Instructions (Addendum)
Your vitamin d level is too high   Hold all vitamin D for about a month  Then resume at 2000 iu once daily   I ordered your bone density test  Here is the phone number to schedule it   Take care of yourself  Stay active   Keep using your walker for safety

## 2019-11-29 NOTE — Assessment & Plan Note (Signed)
Reviewed health habits including diet and exercise and skin cancer prevention Reviewed appropriate screening tests for age  Also reviewed health mt list, fam hx and immunization status , as well as social and family history   See HPI Labs reviewed  Adv directive is utd  No cognitive concerns Hearing test confirms loss (she declines tx or audiol eval right now)  utd vision/eye exam  bp was better on 2nd check  Declines colon cancer screening  Has had shingrix vaccines

## 2019-11-29 NOTE — Assessment & Plan Note (Signed)
bp in fair control at this time  BP Readings from Last 1 Encounters:  11/29/19 135/80   No changes needed Most recent labs reviewed  Disc lifstyle change with low sodium diet and exercise

## 2019-11-29 NOTE — Assessment & Plan Note (Signed)
Stable/under cardiac care No re occurrence per pt

## 2019-11-29 NOTE — Assessment & Plan Note (Signed)
Lab Results  Component Value Date   HGBA1C 5.5 11/24/2019   disc imp of low glycemic diet and wt loss to prevent DM2

## 2020-02-12 ENCOUNTER — Other Ambulatory Visit: Payer: Self-pay | Admitting: Family Medicine

## 2020-02-24 ENCOUNTER — Other Ambulatory Visit: Payer: Self-pay | Admitting: Cardiovascular Disease

## 2020-02-24 NOTE — Telephone Encounter (Signed)
Prescription refill request for Eliquis received.  Last office visit: [place], 10/17/2019 Scr: 1.01, 11/24/2019 Age: 84 y.o. Weight: 77.3 kg   Prescription refill sent.

## 2020-02-24 NOTE — Telephone Encounter (Signed)
Refill request

## 2020-02-27 ENCOUNTER — Other Ambulatory Visit: Payer: Self-pay | Admitting: Family Medicine

## 2020-03-10 ENCOUNTER — Other Ambulatory Visit: Payer: Self-pay | Admitting: Family Medicine

## 2020-03-23 ENCOUNTER — Telehealth: Payer: Self-pay | Admitting: Family Medicine

## 2020-03-23 NOTE — Telephone Encounter (Signed)
Chart updated

## 2020-03-23 NOTE — Telephone Encounter (Signed)
Patient called.  Patient received her flu shot (she wasn't sure if it was high dose) and covid booster today at [facility].

## 2020-04-02 NOTE — Progress Notes (Signed)
Cardiology Office Note  Date:  04/03/2020   ID:  Linda Johnson, DOB 14-Dec-1935, MRN [id number]  PCP:  Linda Pimple, MD   Chief Complaint  Patient presents with  . other    6 month follow up. Meds reviewed by the pt. verbally. "doing well."     HPI:  Linda Johnson is a 84 y.o. woman with a hx of  Permanent atrial fibrillation,since 2017 previously declined cardioversion On anticoagulation/eliquis hypertension who presents for routine follow-up of her  PVCs, atrial fibrillation  Got booster shot, Walks with walker, denies any falls BP elevated today, stress getting to office Bp at home 130s,  Groceries delivered, Mail delivered, [place] Sedentary Uses walker around the house  Not on lasix Weight at home 170  Lab work reviewed HBA1C 5.5 Total chol 143  EKG personally reviewed by myself on todays visit Atrial fib rate 83 bpm no significant ST-T wave changes  Other past medical history reviewed Prior history of supratherapeutic INR of 7 on warfarin Changed to Eliquis  Previously felt she had nausea from the low-dose amiodarone  Atrial fibrillation picked up by primary care on routine EKG early 2017   PMH:   has a past medical history of Arthritis, Arthritis, Atrial fibrillation (HCC), Back pain, Balance problem, Cardiomegaly, Cataract (2020), CHF (congestive heart failure) (HCC) (07/2018), Hypertension, Osteopenia, Pleural effusion (08/25/2018), Shingles (08/2017), and SOB (shortness of breath).  PSH:    Past Surgical History:  Procedure Laterality Date  . BREAST BIOPSY Left 2011   2 areas, cysts per pt  . CATARACT EXTRACTION W/PHACO Right 12/06/2018   Procedure: CATARACT EXTRACTION PHACO AND INTRAOCULAR LENS PLACEMENT (IOC) RIGHT;  Surgeon: Nevada Crane, MD;  [facility];  Service: Ophthalmology;  Laterality: Right;  . CATARACT EXTRACTION W/PHACO Left 01/17/2019   Procedure: CATARACT EXTRACTION PHACO AND INTRAOCULAR LENS PLACEMENT (IOC) LEFT;   Surgeon: Nevada Crane, MD;  Location[facility];  Service: Ophthalmology;  Laterality: Left;    Current Outpatient Medications  Medication Sig Dispense Refill  . calcium-vitamin D (OSCAL WITH D) 500-200 MG-UNIT per tablet Take 1 tablet by mouth 2 (two) times daily.    . Cholecalciferol (VITAMIN D3) 2000 UNITS TABS Take 1 tablet by mouth daily.     Marland Kitchen diltiazem (CARDIZEM CD) 120 MG 24 hr capsule TAKE 1 CAPSULE BY MOUTH EVERY DAY 90 capsule 1  . doxazosin (CARDURA) 8 MG tablet Take 0.5 tablets (4 mg total) by mouth 2 (two) times daily. 180 tablet 2  . ELIQUIS 5 MG TABS tablet TAKE 1 TABLET BY MOUTH TWICE A DAY 60 tablet 5  . latanoprost (XALATAN) 0.005 % ophthalmic solution INSTILL ONE DROP IN BOTH EYES AT BEDTIME.  3  . losartan (COZAAR) 100 MG tablet TAKE 1 TABLET BY MOUTH EVERY DAY 90 tablet 3  . metoprolol succinate (TOPROL XL) 25 MG 24 hr tablet Take 1 tablet (25 mg total) by mouth daily. 90 tablet 3  . Multiple Vitamins-Minerals (HAIR SKIN AND NAILS FORMULA) TABS Take 1 tablet by mouth daily.    . potassium chloride (KLOR-CON) 10 MEQ tablet TAKE 1 TABLET BY MOUTH EVERY DAY 90 tablet 1  . prednisoLONE acetate (PRED FORTE) 1 % ophthalmic suspension Place 1 drop into both eyes at bedtime.    . furosemide (LASIX) 20 MG tablet Take 1 tablet (20 mg) by mouth once daily for weight greater then 173 lbs (Patient not taking: Reported on 04/03/2020)     No current facility-administered medications for  this visit.     Allergies:   Ace inhibitors, Hydrocodone-homatropine, and Tramadol hcl   Social History:  The patient  reports that she has never smoked. She has never used smokeless tobacco. She reports that she does not drink alcohol and does not use drugs.   Family History:   family history includes Breast cancer (age of onset: 56) in her maternal aunt; Breast cancer (age of onset: 21) in her sister; Leukemia in her sister.    Review of Systems: Review of Systems   Constitutional:       Weight gain  HENT: Negative.   Eyes: Negative.   Respiratory: Negative.   Cardiovascular: Negative.   Gastrointestinal: Negative.   Genitourinary: Negative.   Musculoskeletal: Negative.   Neurological: Negative.   Psychiatric/Behavioral: Negative.   All other systems reviewed and are negative.   PHYSICAL EXAM: VS:  BP (!) 146/70 (BP Location: Left Arm, Patient Position: Sitting, Cuff Size: Normal)   Pulse 83   Ht 5\' 2"  (1.575 m)   Wt 173 lb 6 oz (78.6 kg)   LMP  (LMP Unknown)   SpO2 96%   BMI 31.71 kg/m  , BMI Body mass index is 31.71 kg/m.  Constitutional:  oriented to person, place, and time. No distress.  HENT:  Head: Grossly normal Eyes:  no discharge. No scleral icterus.  Neck: No JVD, no carotid bruits  Cardiovascular: Irregularly irregular, no murmurs appreciated Pulmonary/Chest: Clear to auscultation bilaterally, no wheezes or rails Abdominal: Soft.  no distension.  no tenderness.  Musculoskeletal: Normal range of motion Neurological:  normal muscle tone. Coordination normal. No atrophy Skin: Skin warm and dry Psychiatric: normal affect, pleasant  Recent Labs: 11/24/2019: ALT 11; BUN 13; Creatinine, Ser 1.01; Hemoglobin 15.4; Platelets 158.0; Potassium 4.2; Sodium 138; TSH 5.03    Lipid Panel Lab Results  Component Value Date   CHOL 143 11/24/2019   HDL 47.20 11/24/2019   LDLCALC 77 11/24/2019   TRIG 91.0 11/24/2019      Wt Readings from Last 3 Encounters:  04/03/20 173 lb 6 oz (78.6 kg)  11/29/19 170 lb 8 oz (77.3 kg)  10/03/19 172 lb 8 oz (78.2 kg)      ASSESSMENT AND PLAN:  Chronic diastolic CHF Recommend she continue to take Lasix 20 mg for weight 173 pounds or higher on her home scale She has not had to take this very much at home She does report high fluid intake, 8 glasses of water per day  Permanent atrial fibrillation (HCC)  Tolerating Eliquis 5 mg twice a day , Rate control with diltiazem 120  daily Stable  Essential hypertension Blood pressure is well controlled on today's visit. No changes made to the medications. Stable  Gait instability Recommend regular walking program Stressed importance of a walking program Does not get out much, has all of her items delivered, family does everything else  PVCs Not having any symptoms     Total encounter time more than 25 minutes  Greater than 50% was spent in counseling and coordination of care with the patient     Signed, 12/03/19, M.D., Ph.D. 04/03/2020  [facility] [place], [place]

## 2020-04-03 ENCOUNTER — Other Ambulatory Visit: Payer: Self-pay

## 2020-04-03 ENCOUNTER — Ambulatory Visit (INDEPENDENT_AMBULATORY_CARE_PROVIDER_SITE_OTHER): Payer: Medicare Other | Admitting: Cardiovascular Disease

## 2020-04-03 ENCOUNTER — Encounter: Payer: Self-pay | Admitting: Cardiovascular Disease

## 2020-04-03 VITALS — BP 146/70 | HR 83 | Ht 62.0 in | Wt 173.4 lb

## 2020-04-03 DIAGNOSIS — I10 Essential (primary) hypertension: Secondary | ICD-10-CM

## 2020-04-03 DIAGNOSIS — I482 Chronic atrial fibrillation, unspecified: Secondary | ICD-10-CM

## 2020-04-03 DIAGNOSIS — I493 Ventricular premature depolarization: Secondary | ICD-10-CM | POA: Diagnosis not present

## 2020-04-03 DIAGNOSIS — E782 Mixed hyperlipidemia: Secondary | ICD-10-CM | POA: Diagnosis not present

## 2020-04-03 DIAGNOSIS — I5032 Chronic diastolic (congestive) heart failure: Secondary | ICD-10-CM

## 2020-04-03 NOTE — Patient Instructions (Signed)
Medication Instructions:  No changes  Lasix daily for weight 173 pounds or higher  If you need a refill on your cardiac medications before your next appointment, please call your pharmacy.    Lab work: No new labs needed   If you have labs (blood work) drawn today and your tests are completely normal, you will receive your results only by: Marland Kitchen MyChart Message (if you have MyChart) OR . A paper copy in the mail If you have any lab test that is abnormal or we need to change your treatment, we will call you to review the results.   Testing/Procedures: No new testing needed   Follow-Up: At [facility], you and your health needs are our priority.  As part of our continuing mission to provide you with exceptional heart care, we have created designated Provider Care Teams.  These Care Teams include your primary Cardiologist (physician) and Advanced Practice Providers (APPs -  Physician Assistants and Nurse Practitioners) who all work together to provide you with the care you need, when you need it.  . You will need a follow up appointment in 12 months  . Providers on your designated Care Team:   . Nicolasa Ducking, NP . Eula Listen, PA-C . Marisue Ivan, PA-C  Any Other Special Instructions Will Be Listed Below (If Applicable).  COVID-19 Vaccine Information can be found at: [email or web address] For questions related to vaccine distribution or appointments, please email [email or web address] or call [phone].

## 2020-04-10 ENCOUNTER — Other Ambulatory Visit: Payer: Medicare Other

## 2020-05-02 ENCOUNTER — Other Ambulatory Visit: Payer: Self-pay | Admitting: Cardiovascular Disease

## 2020-05-09 ENCOUNTER — Other Ambulatory Visit: Payer: Self-pay

## 2020-05-09 ENCOUNTER — Ambulatory Visit
Admission: RE | Admit: 2020-05-09 | Discharge: 2020-05-09 | Disposition: A | Discharge status: Home / Self Care | Payer: Medicare Other | Source: Ambulatory Visit | Attending: Family Medicine | Admitting: Family Medicine

## 2020-05-09 DIAGNOSIS — E2839 Other primary ovarian failure: Secondary | ICD-10-CM | POA: Diagnosis not present

## 2020-05-18 ENCOUNTER — Telehealth: Payer: Self-pay | Admitting: *Deleted

## 2020-05-18 MED ORDER — ALENDRONATE SODIUM 70 MG PO TABS: 70.0000 mg | ORAL_TABLET | ORAL | 3 refills | Status: DC

## 2020-05-18 NOTE — Telephone Encounter (Signed)
Called pt and she is okay with taking med again but she doesn't know how much it will cost pt said she's on eliquis and it's pretty expensive. Pt will check with the pharmacy and if it's to expensive she will call us back and let us know, if she can afford it she will start taking it again

## 2020-05-18 NOTE — Telephone Encounter (Signed)
-----   Message from Judy Pimple, MD sent at 05/17/2020  7:47 PM EST ----- I want to repeat the generic fosamax (she has had enough of a drug holiday so we can do it again)

## 2020-05-18 NOTE — Telephone Encounter (Signed)
Dr. Royden Purl note on DEXA said:  Osteoporosis -slt worse Unless we have already done it-I want to px alendronate weekly for another 5 y course  Please send in alendronate 70 mg one po weekly #12 3 ref  Let us know if any problems or side effects  We can re check this in 2 y  Continue vit D (and ca if tolerated)

## 2020-07-24 ENCOUNTER — Telehealth: Payer: Self-pay | Admitting: Family Medicine

## 2020-07-24 NOTE — Telephone Encounter (Signed)
Received disability parking placard via mail to be filled out. Placed in RX tower.

## 2020-07-26 NOTE — Telephone Encounter (Signed)
Form in your inbox 

## 2020-07-27 NOTE — Telephone Encounter (Signed)
Done and in IN box 

## 2020-07-30 NOTE — Telephone Encounter (Signed)
Form mailed and copy sent for scanning.

## 2020-08-14 ENCOUNTER — Other Ambulatory Visit: Payer: Self-pay | Admitting: Family Medicine

## 2020-08-14 DIAGNOSIS — Z1231 Encounter for screening mammogram for malignant neoplasm of breast: Secondary | ICD-10-CM

## 2020-08-21 ENCOUNTER — Other Ambulatory Visit: Payer: Self-pay | Admitting: Cardiovascular Disease

## 2020-08-21 NOTE — Telephone Encounter (Signed)
Refill request

## 2020-08-22 ENCOUNTER — Other Ambulatory Visit: Payer: Self-pay | Admitting: Family Medicine

## 2020-09-22 ENCOUNTER — Other Ambulatory Visit: Payer: Self-pay | Admitting: Cardiovascular Disease

## 2020-09-24 NOTE — Telephone Encounter (Signed)
Rx request sent to pharmacy.  

## 2020-10-26 ENCOUNTER — Other Ambulatory Visit: Payer: Self-pay | Admitting: Cardiovascular Disease

## 2020-10-26 NOTE — Telephone Encounter (Signed)
Rx request sent to pharmacy.  

## 2020-11-14 ENCOUNTER — Ambulatory Visit
Admission: RE | Admit: 2020-11-14 | Discharge: 2020-11-14 | Disposition: A | Discharge status: Home / Self Care | Payer: Medicare Other | Source: Ambulatory Visit | Attending: Family Medicine | Admitting: Family Medicine

## 2020-11-14 ENCOUNTER — Other Ambulatory Visit: Payer: Self-pay

## 2020-11-14 DIAGNOSIS — Z1231 Encounter for screening mammogram for malignant neoplasm of breast: Secondary | ICD-10-CM | POA: Diagnosis not present

## 2020-11-23 ENCOUNTER — Telehealth (INDEPENDENT_AMBULATORY_CARE_PROVIDER_SITE_OTHER): Payer: Medicare Other | Admitting: Primary Care

## 2020-11-23 ENCOUNTER — Other Ambulatory Visit (INDEPENDENT_AMBULATORY_CARE_PROVIDER_SITE_OTHER): Payer: Medicare Other

## 2020-11-23 ENCOUNTER — Telehealth: Payer: Self-pay | Admitting: *Deleted

## 2020-11-23 ENCOUNTER — Other Ambulatory Visit: Payer: Self-pay

## 2020-11-23 ENCOUNTER — Encounter: Payer: Self-pay | Admitting: Primary Care

## 2020-11-23 VITALS — BP 124/72 | Ht 62.0 in | Wt 171.0 lb

## 2020-11-23 DIAGNOSIS — U071 COVID-19: Secondary | ICD-10-CM

## 2020-11-23 DIAGNOSIS — Z8616 Personal history of COVID-19: Secondary | ICD-10-CM | POA: Insufficient documentation

## 2020-11-23 DIAGNOSIS — Z20822 Contact with and (suspected) exposure to covid-19: Secondary | ICD-10-CM

## 2020-11-23 MED ORDER — MOLNUPIRAVIR EUA 200MG CAPSULE: 4.0000 | ORAL_CAPSULE | Freq: Two times a day (BID) | ORAL | 0 refills | Status: AC

## 2020-11-23 NOTE — Addendum Note (Signed)
Addended by: Doreene Nest on: 11/23/2020 04:14 PM   Modules accepted: Orders

## 2020-11-23 NOTE — Assessment & Plan Note (Addendum)
Symptom onset one day ago, new symptoms today. Exposed by daughter.  Daughter is sending a picture of her rapid test via her my chart portal. We will need a BMP to confirm kidney function, she may be a better candidate for molnupiravir, she has a history of CKD.  Update: Received positive rapid test via My Chart. Will also obtain PCR.  Given renal function history, will proceed with molnupiravir. Rx sent to pharmacy. Discussed instructions with patient's daughter via phone.  Daughter was very rude to several people in the office today today via phone, including myself.

## 2020-11-23 NOTE — Patient Instructions (Signed)
Please come by for Covid-19 testing and a kidney check today.  It was a pleasure meeting you! Mayra Reel, NP-C

## 2020-11-23 NOTE — Progress Notes (Signed)
Patient ID: Linda Johnson, female    DOB: Dec 07, 1935, 85 y.o.   MRN: [id number]  Virtual visit completed through [employer], a video enabled telemedicine application. Due to national recommendations of social distancing due to COVID-19, a virtual visit is felt to be most appropriate for this patient at this time. Reviewed limitations, risks, security and privacy concerns of performing a virtual visit and the availability of in person appointments. I also reviewed that there may be a patient responsible charge related to this service. The patient agreed to proceed.   She did not have the ability to participate in a video visit so we conducted our visit via phone which lasted 11 min and 20 seconds  Patient location: home Provider location: [place] at [facility], office Persons participating in this virtual visit: patient, provider, daughter  If any vitals were documented, they were collected by patient at home unless specified below.    BP 124/72   Ht 5\' 2"  (1.575 m)   Wt 171 lb (77.6 kg)   LMP  (LMP Unknown)   BMI 31.28 kg/m    CC: Covid-19 infection Subjective:   HPI: Linda Johnson is a 85 y.o. female presenting on 11/23/2020 for covid symptoms, Nasal Congestion, Nausea, and Cough  Symptoms began last night with a scratchy throat and decreased appetite. Today she's noticed nausea, runny nose, cough. She's taken Ibuprofen so far. She plans on getting some chloraseptic spray.  Her daughter currently has Covid-19, is currently receiving treatment. She would like treatment as well.  She denies chest pain, shortness of breath, known fevers. She has completed three Covid-19 vaccines.      Relevant past medical, surgical, family and social history reviewed and updated as indicated. Interim medical history since our last visit reviewed. Allergies and medications reviewed and updated. Outpatient Medications Prior to Visit  Medication Sig Dispense Refill  . alendronate (FOSAMAX) 70  MG tablet Take 1 tablet (70 mg total) by mouth every 7 (seven) days. Take with a full glass of water on an empty stomach. 12 tablet 3  . calcium-vitamin D (OSCAL WITH D) 500-200 MG-UNIT per tablet Take 1 tablet by mouth 2 (two) times daily.    . Cholecalciferol (VITAMIN D3) 2000 UNITS TABS Take 1 tablet by mouth daily.     11/25/2020 diltiazem (CARDIZEM CD) 120 MG 24 hr capsule TAKE 1 CAPSULE BY MOUTH EVERY DAY 90 capsule 2  . doxazosin (CARDURA) 8 MG tablet TAKE 0.5 TABLETS (4 MG TOTAL) BY MOUTH 2 (TWO) TIMES DAILY. 90 tablet 1  . ELIQUIS 5 MG TABS tablet TAKE 1 TABLET BY MOUTH TWICE A DAY 180 tablet 1  . furosemide (LASIX) 20 MG tablet Take 1 tablet (20 mg) by mouth once daily for weight greater then 173 lbs (Patient not taking: Reported on 04/03/2020)    . latanoprost (XALATAN) 0.005 % ophthalmic solution INSTILL ONE DROP IN BOTH EYES AT BEDTIME.  3  . losartan (COZAAR) 100 MG tablet TAKE 1 TABLET BY MOUTH EVERY DAY 90 tablet 3  . metoprolol succinate (TOPROL-XL) 25 MG 24 hr tablet TAKE 1 TABLET BY MOUTH EVERY DAY 90 tablet 2  . Multiple Vitamins-Minerals (HAIR SKIN AND NAILS FORMULA) TABS Take 1 tablet by mouth daily.    . potassium chloride (KLOR-CON) 10 MEQ tablet TAKE 1 TABLET BY MOUTH EVERY DAY 90 tablet 1  . prednisoLONE acetate (PRED FORTE) 1 % ophthalmic suspension Place 1 drop into both eyes at bedtime.     No facility-administered  medications prior to visit.     Per HPI unless specifically indicated in ROS section below Review of Systems Objective:  BP 124/72   Ht 5\' 2"  (1.575 m)   Wt 171 lb (77.6 kg)   LMP  (LMP Unknown)   BMI 31.28 kg/m   Wt Readings from Last 3 Encounters:  11/23/20 171 lb (77.6 kg)  04/03/20 173 lb 6 oz (78.6 kg)  11/29/19 170 lb 8 oz (77.3 kg)       Physical exam: Gen: alert, NAD, not ill appearing Pulm: speaks in complete sentences without increased work of breathing, no cough. Psych: normal mood, normal thought content      Results for orders placed  or performed in visit on 11/24/19  VITAMIN D 25 Hydroxy (Vit-D Deficiency, Fractures)  Result Value Ref Range   VITD 113.25 (HH) 30.00 - 100.00 ng/mL  TSH  Result Value Ref Range   TSH 5.03 (H) 0.35 - 4.50 uIU/mL  Lipid panel  Result Value Ref Range   Cholesterol 143 0 - 200 mg/dL   Triglycerides 11/26/19 0.0 - 149.0 mg/dL   HDL 31.5 40.08 mg/dL   VLDL >67.61 0.0 - 95.0 mg/dL   LDL Cholesterol 77 0 - 99 mg/dL   Total CHOL/HDL Ratio 3    NonHDL 95.42   Hemoglobin A1c  Result Value Ref Range   Hgb A1c MFr Bld 5.5 4.6 - 6.5 %  CBC with Differential/Platelet  Result Value Ref Range   WBC 6.4 4.0 - 10.5 K/uL   RBC 5.01 3.87 - 5.11 Mil/uL   Hemoglobin 15.4 (H) 12.0 - 15.0 g/dL   HCT 93.2 (H) 67.1 - 24.5 %   MCV 92.5 78.0 - 100.0 fl   MCHC 33.3 30.0 - 36.0 g/dL   RDW 80.9 98.3 - 38.2 %   Platelets 158.0 150.0 - 400.0 K/uL   Neutrophils Relative % 68.7 43.0 - 77.0 %   Lymphocytes Relative 24.3 12.0 - 46.0 %   Monocytes Relative 5.6 3.0 - 12.0 %   Eosinophils Relative 1.0 0.0 - 5.0 %   Basophils Relative 0.4 0.0 - 3.0 %   Neutro Abs 4.4 1.4 - 7.7 K/uL   Lymphs Abs 1.5 0.7 - 4.0 K/uL   Monocytes Absolute 0.4 0.1 - 1.0 K/uL   Eosinophils Absolute 0.1 0.0 - 0.7 K/uL   Basophils Absolute 0.0 0.0 - 0.1 K/uL  Comprehensive metabolic panel  Result Value Ref Range   Sodium 138 135 - 145 mEq/L   Potassium 4.2 3.5 - 5.1 mEq/L   Chloride 100 96 - 112 mEq/L   CO2 31 19 - 32 mEq/L   Glucose, Bld 131 (H) 70 - 99 mg/dL   BUN 13 6 - 23 mg/dL   Creatinine, Ser 50.5 0.40 - 1.20 mg/dL   Total Bilirubin 1.0 0.2 - 1.2 mg/dL   Alkaline Phosphatase 63 39 - 117 U/L   AST 16 0 - 37 U/L   ALT 11 0 - 35 U/L   Total Protein 7.8 6.0 - 8.3 g/dL   Albumin 4.5 3.5 - 5.2 g/dL   GFR 3.97 (L) 67.34 mL/min   Calcium 9.8 8.4 - 10.5 mg/dL   Assessment & Plan:   Problem List Items Addressed This Visit      Other   Suspected COVID-19 virus infection - Primary    Symptom onset one day ago, new symptoms  today. Exposed by daughter.  Daughter is sending a picture of her rapid test via her my chart portal.  We will need a BMP to confirm kidney function, she may be a better candidate for molnupiravir, she has a history of CKD.  Will await Covid result.       Relevant Orders   Novel Coronavirus, NAA ([facility])   Basic metabolic panel       No orders of the defined types were placed in this encounter.  Orders Placed This Encounter  Procedures  . Novel Coronavirus, NAA ([facility])    Standing Status:   Future    Number of Occurrences:   1    Standing Expiration Date:   11/23/2021    Order Specific Question:   Is this test for diagnosis or screening    Answer:   Diagnosis of ill patient    Order Specific Question:   Symptomatic for COVID-19 as defined by CDC    Answer:   Yes    Order Specific Question:   Date of Symptom Onset    Answer:   11/22/2020    Order Specific Question:   Hospitalized for COVID-19    Answer:   No    Order Specific Question:   Admitted to ICU for COVID-19    Answer:   No    Order Specific Question:   Resident in a congregate (group) care setting    Answer:   No    Order Specific Question:   Is the patient student?    Answer:   No    Order Specific Question:   Employed in healthcare setting    Answer:   No    Order Specific Question:   Has patient completed COVID vaccination(s) (2 doses of Pfizer/Moderna 1 dose of [employer])    Answer:   Yes    Order Specific Question:   Has patient completed COVID Booster / 3rd dose    Answer:   Yes    Order Specific Question:   Previously tested for COVID-19    Answer:   Yes    Order Specific Question:   Pregnant    Answer:   No  . Basic metabolic panel    Standing Status:   Future    Number of Occurrences:   1    Standing Expiration Date:   11/23/2021    I discussed the assessment and treatment plan with the patient. The patient was provided an opportunity to ask questions and all were answered. The patient agreed  with the plan and demonstrated an understanding of the instructions. The patient was advised to call back or seek an in-person evaluation if the symptoms worsen or if the condition fails to improve as anticipated.  Follow up plan:  Please come by for Covid-19 testing and a kidney check today.  It was a pleasure meeting you! Mayra Reel, NP-C   Doreene Nest, NP

## 2020-11-23 NOTE — Telephone Encounter (Signed)
Pt called in to triage, her daughter is Dr. Royden Purl pt also and was recently diagnosed with covid (at [facility]). Pt is starting to get sxs also cough congestion and tried to do the at home test but couldn't figure it out. Pt wants the medication her daughter had (paxlovid). I advise pt she needs an appt to discuss sxs with provider and get lab work here before we can give meds to her. Spoke with Jae Dire and virtual phone call (no smart phone) scheduled at 3:20, there were no early testing appts so lab appt scheduled for 4pm today after phone call with Jae Dire. Lab just needs to know what "test" we will be doing after pt sees Jae Dire. Pt wants blood work to because she said her daughter had to get blood work before meds were given.   FYI to [employer]

## 2020-11-23 NOTE — Telephone Encounter (Signed)
Patient was called and she informed me she did not have a picture of the Covid Test and she was getting a covid test today. Patient stated her understanding.

## 2020-11-23 NOTE — Telephone Encounter (Signed)
Misty Stanley (daughter) left VM at Triage. She helped pt do an at home covid test and it was positive, Misty Stanley wants "something" called in if possible.

## 2020-11-23 NOTE — Telephone Encounter (Signed)
Patient will still need to do the visit as scheduled. Also we need proof of the positive result.

## 2020-11-24 LAB — BASIC METABOLIC PANEL
BUN/Creatinine Ratio: 20 (calc) (ref 6–22)
BUN: 18 mg/dL (ref 7–25)
CO2: 25 mmol/L (ref 20–32)
Calcium: 9.3 mg/dL (ref 8.6–10.4)
Chloride: 103 mmol/L (ref 98–110)
Creat: 0.92 mg/dL — ABNORMAL HIGH (ref 0.60–0.88)
Glucose, Bld: 97 mg/dL (ref 65–99)
Potassium: 4.3 mmol/L (ref 3.5–5.3)
Sodium: 139 mmol/L (ref 135–146)

## 2020-11-24 LAB — SARS-COV-2, NAA 2 DAY TAT

## 2020-11-24 LAB — NOVEL CORONAVIRUS, NAA: SARS-CoV-2, NAA: DETECTED — AB

## 2020-11-27 NOTE — Telephone Encounter (Signed)
[place] Night - Client TELEPHONE ADVICE RECORD [facility] Patient Name: Linda Johnson [facility] KER Gender: Female DOB: 10/21/1935 Age: 85 Y 75 M 18 D Return Phone Number: [phone] (Primary), [phone] (Secondary) Address: City/ State/ Zip: [place] [address] Client [place] Night - Client Client Site [facility] [place] Contact Type Call Who Is Calling Patient / Member / Family / Caregiver Call Type Triage / Clinical Caller Name Nolah Krenzer Relationship To Patient Daughter Return Phone Number [phone] (Primary) Chief Complaint Fatigue (>THREE MONTHS) Reason for Call Symptomatic / Request for Health Information Initial Comment Pt took at home covid test and it pos, but not sure if she has it. Pt has scratchy throat, fatigue, loss of appetite, runny nose. Caller wants to know if she should take the medication. Translation No Nurse Assessment Nurse: [place], RN, Estill Bamberg Date/Time (Eastern Time): 11/23/2020 6:56:12 PM Confirm and document reason for call. If symptomatic, describe symptoms. ---caller states her mother has a positive covid at home test done. mom was prescribed the antiviral and she is asking if her mother should start the medication. daughter says mother is hard of hearing and would not be able to hear good on the phone. symptoms are mild at this time. and not any worse than when she called office earlier Does the patient have any new or worsening symptoms? ---Yes Will a triage be completed? ---Yes Related visit to physician within the last 2 weeks? ---Yes Does the PT have any chronic conditions? (i.e. diabetes, asthma, this includes High risk factors for pregnancy, etc.) ---Yes List chronic conditions. ---HTN, lung Is this a behavioral health or substance abuse call? ---No Guidelines Guideline Title Affirmed Question Affirmed Notes Nurse Date/Time  ([place] Time) COVID-19 - Diagnosed or Suspected [1] COVID-19 diagnosed by positive lab test (e.g., PCR, rapid Humfleet, RN, Estill Bamberg 11/23/2020 7:05:21 PM PLEASE NOTE: All timestamps contained within this report are represented as [place] Standard Time. CONFIDENTIALTY NOTICE: This fax transmission is intended only for the addressee. It contains information that is legally privileged, confidential or otherwise protected from use or disclosure. If you are not the intended recipient, you are strictly prohibited from reviewing, disclosing, copying using or disseminating any of this information or taking any action in reliance on or regarding this information. If you have received this fax in error, please notify us immediately by telephone so that we can arrange for its return to [place]. Phone: [phone], Toll-Free: [phone], Fax: [phone] Page: 2 of 2 Call Id: [id number] Guidelines Guideline Title Affirmed Question Affirmed Notes Nurse Date/Time Eilene Ghazi Time) self-test kit) AND [2] mild symptoms (e.g., cough, fever, others) AND [8] no complications or SOB Disp. Time Eilene Ghazi Time) Disposition Final User 11/23/2020 7:07:20 PM Home Care Yes Humfleet, RN, Shelly Coss Disagree/Comply Comply Caller Understands Yes PreDisposition Did not know what to do Care Advice Given Per Guideline CARE ADVICE given per COVID-19 - DIAGNOSED OR SUSPECTED (Adult) guideline. * You become worse * Chest pain or difficulty breathing occurs CALL BACK IF:

## 2020-12-04 ENCOUNTER — Telehealth: Payer: Self-pay | Admitting: Family Medicine

## 2020-12-04 DIAGNOSIS — I10 Essential (primary) hypertension: Secondary | ICD-10-CM

## 2020-12-04 DIAGNOSIS — E782 Mixed hyperlipidemia: Secondary | ICD-10-CM

## 2020-12-04 DIAGNOSIS — E039 Hypothyroidism, unspecified: Secondary | ICD-10-CM

## 2020-12-04 DIAGNOSIS — R7303 Prediabetes: Secondary | ICD-10-CM

## 2020-12-04 DIAGNOSIS — M810 Age-related osteoporosis without current pathological fracture: Secondary | ICD-10-CM

## 2020-12-04 NOTE — Telephone Encounter (Signed)
-----   Message from Aquilla Solian, RT sent at 11/19/2020 12:34 PM EDT ----- Regarding: Lab Orders for Wednesday 6.8.2022 Please place lab orders for Wednesday 6.8.2022, office visit for physical on Thursday 6.16.2022 Thank you, Jones Bales RT(R)

## 2020-12-05 ENCOUNTER — Other Ambulatory Visit: Payer: Self-pay

## 2020-12-05 ENCOUNTER — Other Ambulatory Visit (INDEPENDENT_AMBULATORY_CARE_PROVIDER_SITE_OTHER): Payer: Medicare Other

## 2020-12-05 ENCOUNTER — Ambulatory Visit (INDEPENDENT_AMBULATORY_CARE_PROVIDER_SITE_OTHER): Payer: Medicare Other

## 2020-12-05 DIAGNOSIS — E782 Mixed hyperlipidemia: Secondary | ICD-10-CM | POA: Diagnosis not present

## 2020-12-05 DIAGNOSIS — Z0000 Encounter for general adult medical examination without abnormal findings: Secondary | ICD-10-CM

## 2020-12-05 DIAGNOSIS — E039 Hypothyroidism, unspecified: Secondary | ICD-10-CM | POA: Diagnosis not present

## 2020-12-05 DIAGNOSIS — M810 Age-related osteoporosis without current pathological fracture: Secondary | ICD-10-CM

## 2020-12-05 DIAGNOSIS — R7303 Prediabetes: Secondary | ICD-10-CM

## 2020-12-05 DIAGNOSIS — I10 Essential (primary) hypertension: Secondary | ICD-10-CM | POA: Diagnosis not present

## 2020-12-05 LAB — COMPREHENSIVE METABOLIC PANEL
ALT: 9 U/L (ref 0–35)
AST: 11 U/L (ref 0–37)
Albumin: 3.9 g/dL (ref 3.5–5.2)
Alkaline Phosphatase: 55 U/L (ref 39–117)
BUN: 19 mg/dL (ref 6–23)
CO2: 32 mEq/L (ref 19–32)
Calcium: 9.6 mg/dL (ref 8.4–10.5)
Chloride: 102 mEq/L (ref 96–112)
Creatinine, Ser: 1.04 mg/dL (ref 0.40–1.20)
GFR: 50 mL/min — ABNORMAL LOW (ref >60.00)
Glucose, Bld: 106 mg/dL — ABNORMAL HIGH (ref 70–99)
Potassium: 4.6 mEq/L (ref 3.5–5.1)
Sodium: 140 mEq/L (ref 135–145)
Total Bilirubin: 0.9 mg/dL (ref 0.2–1.2)
Total Protein: 6.8 g/dL (ref 6.0–8.3)

## 2020-12-05 LAB — HEMOGLOBIN A1C: Hgb A1c MFr Bld: 5.9 % (ref 4.6–6.5)

## 2020-12-05 LAB — CBC WITH DIFFERENTIAL/PLATELET
Basophils Absolute: 0 10*3/uL (ref 0.0–0.1)
Basophils Relative: 0.4 % (ref 0.0–3.0)
Eosinophils Absolute: 0 10*3/uL (ref 0.0–0.7)
Eosinophils Relative: 0.7 % (ref 0.0–5.0)
HCT: 42.5 % (ref 36.0–46.0)
Hemoglobin: 14.5 g/dL (ref 12.0–15.0)
Lymphocytes Relative: 16.8 % (ref 12.0–46.0)
Lymphs Abs: 1.1 10*3/uL (ref 0.7–4.0)
MCHC: 34.2 g/dL (ref 30.0–36.0)
MCV: 89.9 fl (ref 78.0–100.0)
Monocytes Absolute: 0.3 10*3/uL (ref 0.1–1.0)
Monocytes Relative: 5.2 % (ref 3.0–12.0)
Neutro Abs: 4.9 10*3/uL (ref 1.4–7.7)
Neutrophils Relative %: 76.9 % (ref 43.0–77.0)
Platelets: 194 10*3/uL (ref 150.0–400.0)
RBC: 4.73 Mil/uL (ref 3.87–5.11)
RDW: 13.1 % (ref 11.5–15.5)
WBC: 6.3 10*3/uL (ref 4.0–10.5)

## 2020-12-05 LAB — TSH: TSH: 3.82 u[IU]/mL (ref 0.35–4.50)

## 2020-12-05 LAB — LIPID PANEL
Cholesterol: 133 mg/dL (ref 0–200)
HDL: 37.7 mg/dL — ABNORMAL LOW (ref >39.00)
LDL Cholesterol: 73 mg/dL (ref 0–99)
NonHDL: 94.98
Total CHOL/HDL Ratio: 4
Triglycerides: 110 mg/dL (ref 0.0–149.0)
VLDL: 22 mg/dL (ref 0.0–40.0)

## 2020-12-05 LAB — VITAMIN D 25 HYDROXY (VIT D DEFICIENCY, FRACTURES): VITD: 85.17 ng/mL (ref 30.00–100.00)

## 2020-12-05 NOTE — Progress Notes (Signed)
Subjective:   Linda CousinsLinda R Johnson is a 85 y.o. female who presents for Medicare Annual (Subsequent) preventive examination.  Review of Systems: N/A     I connected with the patient today by telephone and verified that I am speaking with the correct person using two identifiers. Location patient: home Location nurse: work Persons participating in the telephone visit: patient, nurse.   I discussed the limitations, risks, security and privacy concerns of performing an evaluation and management service by telephone and the availability of in person appointments. I also discussed with the patient that there may be a patient responsible charge related to this service. The patient expressed understanding and verbally consented to this telephonic visit.        Cardiac Risk Factors include: advanced age (>2255men, 82>65 women);hypertension;Other (see comment), Risk factor comments: hyperlipidemia     Objective:    Today's Vitals   There is no height or weight on file to calculate BMI.  Advanced Directives 12/05/2020 01/17/2019 12/06/2018 11/23/2018 08/25/2018 08/24/2018 10/02/2017  Does Patient Have a Medical Advance Directive? Yes Yes Yes Yes Yes No Yes  Type of Estate agentAdvance Directive Healthcare Power of MarcelineAttorney;Living will Healthcare Power of [place] NorthwestAttorney;Living will Healthcare Power of [place] of [place]orney;Living will Healthcare Power of [place]Attorney;Living will Healthcare Power of Constellation Energyttorney - Healthcare Power of [place]Attorney;Living will  Does patient want to make changes to medical advance directive? - Yes (MAU/Ambulatory/Procedural Areas - Information given) No - Patient declined - No - Patient declined - -  Copy of Healthcare Power of Attorney in Chart? Yes - validated most recent copy scanned in chart (See row information) No - copy requested Yes - validated most recent copy scanned in chart (See row information) No - copy requested - - Yes  Would patient like information on creating a medical advance directive? - - - - No - Patient  declined No - Patient declined -    Current Medications (verified) Outpatient Encounter Medications as of 12/05/2020  Medication Sig  . alendronate (FOSAMAX) 70 MG tablet Take 1 tablet (70 mg total) by mouth every 7 (seven) days. Take with a full glass of water on an empty stomach.  . calcium-vitamin D (OSCAL WITH D) 500-200 MG-UNIT per tablet Take 1 tablet by mouth 2 (two) times daily.  . Cholecalciferol (VITAMIN D3) 2000 UNITS TABS Take 1 tablet by mouth daily.   Marland Kitchen. diltiazem (CARDIZEM CD) 120 MG 24 hr capsule TAKE 1 CAPSULE BY MOUTH EVERY DAY  . doxazosin (CARDURA) 8 MG tablet TAKE 0.5 TABLETS (4 MG TOTAL) BY MOUTH 2 (TWO) TIMES DAILY.  Marland Kitchen. ELIQUIS 5 MG TABS tablet TAKE 1 TABLET BY MOUTH TWICE A DAY  . furosemide (LASIX) 20 MG tablet Take 1 tablet (20 mg) by mouth once daily for weight greater then 173 lbs  . latanoprost (XALATAN) 0.005 % ophthalmic solution INSTILL ONE DROP IN BOTH EYES AT BEDTIME.  Marland Kitchen. losartan (COZAAR) 100 MG tablet TAKE 1 TABLET BY MOUTH EVERY DAY  . metoprolol succinate (TOPROL-XL) 25 MG 24 hr tablet TAKE 1 TABLET BY MOUTH EVERY DAY  . Multiple Vitamins-Minerals (HAIR SKIN AND NAILS FORMULA) TABS Take 1 tablet by mouth daily.  . potassium chloride (KLOR-CON) 10 MEQ tablet TAKE 1 TABLET BY MOUTH EVERY DAY   No facility-administered encounter medications on file as of 12/05/2020.    Allergies (verified) Ace inhibitors, Hydrocodone bit-homatrop mbr, and Tramadol hcl   History: Past Medical History:  Diagnosis Date  . Arthritis    hips, knees  . Arthritis   .  Atrial fibrillation (HCC)    chronic  . Back pain   . Balance problem   . Cardiomegaly   . Cataract 2020   Right eye December 06, 2018, Left eye date pending  . CHF (congestive heart failure) (HCC) 07/2018   chronic  . Hypertension   . Osteopenia   . Pleural effusion 08/25/2018   [facility]  . Shingles 08/2017   H/O  . SOB (shortness of breath)    Past Surgical History:  Procedure Laterality Date  . BREAST  BIOPSY Left 2011   2 areas, cysts per pt  . CATARACT EXTRACTION W/PHACO Right 12/06/2018   Procedure: CATARACT EXTRACTION PHACO AND INTRAOCULAR LENS PLACEMENT (IOC) RIGHT;  Surgeon: Nevada Crane, MD;  [facility];  Service: Ophthalmology;  Laterality: Right;  . CATARACT EXTRACTION W/PHACO Left 01/17/2019   Procedure: CATARACT EXTRACTION PHACO AND INTRAOCULAR LENS PLACEMENT (IOC) LEFT;  Surgeon: Nevada Crane, MD;  Location: [facility];  Service: Ophthalmology;  Laterality: Left;   Family History  Problem Relation Age of Onset  . Leukemia Sister   . Breast cancer Sister 80  . Breast cancer Maternal Aunt 66   Social History   Socioeconomic History  . Marital status: Divorced    Spouse name: Not on file  . Number of children: Not on file  . Years of education: Not on file  . Highest education level: Not on file  Occupational History  . Not on file  Tobacco Use  . Smoking status: Never Smoker  . Smokeless tobacco: Never Used  Vaping Use  . Vaping Use: Never used  Substance and Sexual Activity  . Alcohol use: No    Alcohol/week: 0.0 standard drinks  . Drug use: No  . Sexual activity: Never  Other Topics Concern  . Not on file  Social History Narrative  . Not on file   Social Determinants of Health   Financial Resource Strain: Low Risk   . Difficulty of Paying Living Expenses: Not hard at all  Food Insecurity: No Food Insecurity  . Worried About Programme researcher, broadcasting/film/video in the Last Year: Never true  . Ran Out of Food in the Last Year: Never true  Transportation Needs: No Transportation Needs  . Lack of Transportation (Medical): No  . Lack of Transportation (Non-Medical): No  Physical Activity: Inactive  . Days of Exercise per Week: 0 days  . Minutes of Exercise per Session: 0 min  Stress: No Stress Concern Present  . Feeling of Stress : Not at all  Social Connections: Not on file    Tobacco Counseling Counseling given: Not  Answered   Clinical Intake:  Pre-visit preparation completed: Yes  Pain : No/denies pain     Nutritional Risks: None Diabetes: No  How often do you need to have someone help you when you read instructions, pamphlets, or other written materials from your doctor or pharmacy?: 1 - Never  Diabetic: No Nutrition Risk Assessment:   Has the patient had any N/V/D within the last 2 months?  No  Does the patient have any non-healing wounds?  No  Has the patient had any unintentional weight loss or weight gain?  No   Diabetes:  Is the patient diabetic?  No  If diabetic, was a CBG obtained today?  N/A Did the patient bring in their glucometer from home?  N/A How often do you monitor your CBG's? N/A.   Financial Strains and Diabetes Management:  Are you having any financial  strains with the device, your supplies or your medication? N/A.  Does the patient want to be seen by Chronic Care Management for management of their diabetes?  N/A Would the patient like to be referred to a Nutritionist or for Diabetic Management?  N/A   Interpreter Needed?: No  Information entered by :: CJohnson, LPN   Activities of Daily Living In your present state of health, do you have any difficulty performing the following activities: 12/05/2020  Hearing? Y  Comment some hearing loss noted  Vision? N  Difficulty concentrating or making decisions? N  Walking or climbing stairs? N  Dressing or bathing? N  Doing errands, shopping? N  Preparing Food and eating ? N  Using the Toilet? N  In the past six months, have you accidently leaked urine? Y  Comment wears pad at night  Do you have problems with loss of bowel control? N  Managing your Medications? N  Managing your Finances? N  Housekeeping or managing your Housekeeping? N  Some recent data might be hidden    Patient Care Team: Tower, Audrie Gallus, MD as PCP - General Mariah Milling, Tollie Pizza, MD as Consulting Physician (Cardiology)  Indicate any recent  Medical Services you may have received from other than [facility] providers in the past year (date may be approximate).     Assessment:   This is a routine wellness examination for [place].  Hearing/Vision screen  Hearing Screening   125Hz  250Hz  500Hz  1000Hz  2000Hz  3000Hz  4000Hz  6000Hz  8000Hz   Right ear:           Left ear:           Vision Screening Comments: Patient gets annual eye exams   Dietary issues and exercise activities discussed: Current Exercise Habits: The patient does not participate in regular exercise at present, Exercise limited by: None identified  Goals Addressed            This Visit's Progress   . Patient Stated       12/05/2020, I will maintain and continue medications as prescribed.      Depression Screen PHQ 2/9 Scores 12/05/2020 11/29/2019 11/23/2018 10/02/2017 09/23/2016 09/13/2015 09/11/2014  PHQ - 2 Score 0 0 0 0 0 0 0  PHQ- 9 Score 0 - 0 0 - - -    Fall Risk Fall Risk  12/05/2020 11/29/2019 01/31/2019 11/23/2018 10/02/2017  Falls in the past year? 0 0 0 0 No  Comment - - Emmi Telephone Survey: data to providers prior to load - -  Number falls in past yr: 0 0 - - -  Injury with Fall? 0 0 - - -  Risk for fall due to : Medication side effect - - - -  Risk for fall due to: Comment - - - - -  Follow up Falls evaluation completed;Falls prevention discussed Falls evaluation completed - - -    FALL RISK PREVENTION PERTAINING TO THE HOME:  Any stairs in or around the home? Yes  If so, are there any without handrails? No  Home free of loose throw rugs in walkways, pet beds, electrical cords, etc? Yes  Adequate lighting in your home to reduce risk of falls? Yes   ASSISTIVE DEVICES UTILIZED TO PREVENT FALLS:  Life alert? No  Use of a cane, walker or w/c? Yes  Grab bars in the bathroom? No  Shower chair or bench in shower? No  Elevated toilet seat or a handicapped toilet? No   TIMED UP AND GO:  Was  the test performed? N/A telephone visit.   Cognitive Function: MMSE -  Mini Mental State Exam 12/05/2020 11/23/2018 10/02/2017 09/23/2016 09/13/2015  Not completed: Refused - - - -  Orientation to time - Orientation to Place - Registration - Attention/ Calculation - 0 0 0 5  Recall - Language- name 2 objects - 0 0 0 0  Language- repeat - Language- follow 3 step command - 0 Language- follow 3 step command-comments - - unable to follow 1 step of 3 step command - -  Language- read & follow direction - 0 0 0 1  Write a sentence - 0 0 0 0  Copy design - 0 0 0 0  Total score - Mini Cog  Mini-Cog screen was not completed. Patient refused. Maximum score is 22. A value of 0 denotes this part of the MMSE was not completed or the patient failed this part of the Mini-Cog screening.       Immunizations Immunization History  Administered Date(s) Administered  . Fluad Quad(high Dose 65+) 03/01/2019  . Influenza Split 04/01/2011, 03/17/2012  . Influenza Whole 04/23/2006, 04/02/2007, 03/27/2008, 04/16/2009, 04/23/2010  . Influenza, High Dose Seasonal PF 03/23/2020  . Influenza,inj,Quad PF,6+ Mos 03/24/2013, 04/10/2014, 04/13/2015, 03/19/2016, 03/25/2017, 05/06/2018  . PFIZER(Purple Top)SARS-COV-2 Vaccination 07/11/2019, 08/01/2019, 03/23/2020  . Pneumococcal Conjugate-13 09/11/2014  . Pneumococcal Polysaccharide-23 04/16/2009  . Td 04/23/2006  . Zoster Recombinat (Shingrix) 03/22/2019, 06/10/2019    TDAP status: Due, Education has been provided regarding the importance of this vaccine. Advised may receive this vaccine at local pharmacy or Health Dept. Aware to provide a copy of the vaccination record if obtained from local pharmacy or Health Dept. Verbalized acceptance and understanding.  Flu Vaccine status: Up to date  Pneumococcal vaccine status: Up to date  Covid-19 vaccine status: 3 vaccines completed, Patient aware second booster is due   Qualifies for Shingles Vaccine? Yes   Zostavax completed  No   Shingrix Completed?: Yes  Screening Tests Health Maintenance  Topic Date Due  . Pneumococcal Vaccine 59-62 Years old (1 of 4 - PCV13) Never done  . TETANUS/TDAP  04/22/2026 (Originally 04/23/2016)  . INFLUENZA VACCINE  01/28/2021  . MAMMOGRAM  11/14/2021  . DEXA SCAN  Completed  . COVID-19 Vaccine  Completed  . PNA vac Low Risk Adult  Completed  . Zoster Vaccines- Shingrix  Completed  . HPV VACCINES  Aged Out    Health Maintenance  Health Maintenance Due  Topic Date Due  . Pneumococcal Vaccine 68-34 Years old (1 of 4 - PCV13) Never done    Colorectal cancer screening: No longer required.   Mammogram status: Completed 11/14/2020. Repeat every year  Bone Density status: Completed 05/09/2020. Results reflect: Bone density results: OSTEOPOROSIS. Repeat every 2 years.  Lung Cancer Screening: (Low Dose CT Chest recommended if Age 75-80 years, 30 pack-year currently smoking OR have quit w/in 15years.) does not qualify.    Additional Screening:  Hepatitis C Screening: does not qualify; Completed N/A  Vision Screening: Recommended annual ophthalmology exams for early detection of glaucoma and other disorders of the eye. Is the patient up to date with their annual eye exam?  Yes  Who is the provider or what is the name of the office in which the patient attends annual eye exams? [facility] If  pt is not established with a provider, would they like to be referred to a provider to establish care? No .   Dental Screening: Recommended annual dental exams for proper oral hygiene  Community Resource Referral / Chronic Care Management: CRR required this visit?  No   CCM required this visit?  No      Plan:     I have personally reviewed and noted the following in the patient's chart:   . Medical and social history . Use of alcohol, tobacco or illicit drugs  . Current medications and supplements including opioid prescriptions.  . Functional ability and  status . Nutritional status . Physical activity . Advanced directives . List of other physicians . Hospitalizations, surgeries, and ER visits in previous 12 months . Vitals . Screenings to include cognitive, depression, and falls . Referrals and appointments  In addition, I have reviewed and discussed with patient certain preventive protocols, quality metrics, and best practice recommendations. A written personalized care plan for preventive services as well as general preventive health recommendations were provided to patient.   Due to this being a telephonic visit, the after visit summary with patients personalized plan was offered to patient via office or my-chart. Patient preferred to pick up at office at next visit or via mychart.   Janalyn Shy, LPN   08/31/1958

## 2020-12-05 NOTE — Patient Instructions (Signed)
Linda Johnson , Thank you for taking time to come for your Medicare Wellness Visit. I appreciate your ongoing commitment to your health goals. Please review the following plan we discussed and let me know if I can assist you in the future.   Screening recommendations/referrals: Colonoscopy: no longer required  Mammogram: Up to date, completed 11/14/2020, due 10/2021 Bone Density: Up to date, completed 05/09/2020, due 04/2022 Recommended yearly ophthalmology/optometry visit for glaucoma screening and checkup Recommended yearly dental visit for hygiene and checkup  Vaccinations: Influenza vaccine: Up to date, completed 03/23/2020, due 01/2021 Pneumococcal vaccine: Completed series Tdap vaccine: decline-insurance Shingles vaccine: Completed series   Covid-19: 3 doses completed, booster is due   Advanced directives: copy in chart  Conditions/risks identified: hypertension, hyperlipidemia   Next appointment: Follow up in one year for your annual wellness visit    Preventive Care 65 Years and Older, Female Preventive care refers to lifestyle choices and visits with your health care provider that can promote health and wellness. What does preventive care include?  A yearly physical exam. This is also called an annual well check.  Dental exams once or twice a year.  Routine eye exams. Ask your health care provider how often you should have your eyes checked.  Personal lifestyle choices, including:  Daily care of your teeth and gums.  Regular physical activity.  Eating a healthy diet.  Avoiding tobacco and drug use.  Limiting alcohol use.  Practicing safe sex.  Taking low-dose aspirin every day.  Taking vitamin and mineral supplements as recommended by your health care provider. What happens during an annual well check? The services and screenings done by your health care provider during your annual well check will depend on your age, overall health, lifestyle risk factors, and  family history of disease. Counseling  Your health care provider may ask you questions about your:  Alcohol use.  Tobacco use.  Drug use.  Emotional well-being.  Home and relationship well-being.  Sexual activity.  Eating habits.  History of falls.  Memory and ability to understand (cognition).  Work and work Astronomer.  Reproductive health. Screening  You may have the following tests or measurements:  Height, weight, and BMI.  Blood pressure.  Lipid and cholesterol levels. These may be checked every 5 years, or more frequently if you are over 93 years old.  Skin check.  Lung cancer screening. You may have this screening every year starting at age 15 if you have a 30-pack-year history of smoking and currently smoke or have quit within the past 15 years.  Fecal occult blood test (FOBT) of the stool. You may have this test every year starting at age 26.  Flexible sigmoidoscopy or colonoscopy. You may have a sigmoidoscopy every 5 years or a colonoscopy every 10 years starting at age 57.  Hepatitis C blood test.  Hepatitis B blood test.  Sexually transmitted disease (STD) testing.  Diabetes screening. This is done by checking your blood sugar (glucose) after you have not eaten for a while (fasting). You may have this done every 1-3 years.  Bone density scan. This is done to screen for osteoporosis. You may have this done starting at age 72.  Mammogram. This may be done every 1-2 years. Talk to your health care provider about how often you should have regular mammograms. Talk with your health care provider about your test results, treatment options, and if necessary, the need for more tests. Vaccines  Your health care provider may recommend certain vaccines,  such as:  Influenza vaccine. This is recommended every year.  Tetanus, diphtheria, and acellular pertussis (Tdap, Td) vaccine. You may need a Td booster every 10 years.  Zoster vaccine. You may need this  after age 64.  Pneumococcal 13-valent conjugate (PCV13) vaccine. One dose is recommended after age 65.  Pneumococcal polysaccharide (PPSV23) vaccine. One dose is recommended after age 6. Talk to your health care provider about which screenings and vaccines you need and how often you need them. This information is not intended to replace advice given to you by your health care provider. Make sure you discuss any questions you have with your health care provider. Document Released: 07/13/2015 Document Revised: 03/05/2016 Document Reviewed: 04/17/2015 Elsevier Interactive Patient Education  2017 [place] Prevention in the Home Falls can cause injuries. They can happen to people of all ages. There are many things you can do to make your home safe and to help prevent falls. What can I do on the outside of my home?  Regularly fix the edges of walkways and driveways and fix any cracks.  Remove anything that might make you trip as you walk through a door, such as a raised step or threshold.  Trim any bushes or trees on the path to your home.  Use bright outdoor lighting.  Clear any walking paths of anything that might make someone trip, such as rocks or tools.  Regularly check to see if handrails are loose or broken. Make sure that both sides of any steps have handrails.  Any raised decks and porches should have guardrails on the edges.  Have any leaves, snow, or ice cleared regularly.  Use sand or salt on walking paths during winter.  Clean up any spills in your garage right away. This includes oil or grease spills. What can I do in the bathroom?  Use night lights.  Install grab bars by the toilet and in the tub and shower. Do not use towel bars as grab bars.  Use non-skid mats or decals in the tub or shower.  If you need to sit down in the shower, use a plastic, non-slip stool.  Keep the floor dry. Clean up any water that spills on the floor as soon as it  happens.  Remove soap buildup in the tub or shower regularly.  Attach bath mats securely with double-sided non-slip rug tape.  Do not have throw rugs and other things on the floor that can make you trip. What can I do in the bedroom?  Use night lights.  Make sure that you have a light by your bed that is easy to reach.  Do not use any sheets or blankets that are too big for your bed. They should not hang down onto the floor.  Have a firm chair that has side arms. You can use this for support while you get dressed.  Do not have throw rugs and other things on the floor that can make you trip. What can I do in the kitchen?  Clean up any spills right away.  Avoid walking on wet floors.  Keep items that you use a lot in easy-to-reach places.  If you need to reach something above you, use a strong step stool that has a grab bar.  Keep electrical cords out of the way.  Do not use floor polish or wax that makes floors slippery. If you must use wax, use non-skid floor wax.  Do not have throw rugs and other things on  the floor that can make you trip. What can I do with my stairs?  Do not leave any items on the stairs.  Make sure that there are handrails on both sides of the stairs and use them. Fix handrails that are broken or loose. Make sure that handrails are as long as the stairways.  Check any carpeting to make sure that it is firmly attached to the stairs. Fix any carpet that is loose or worn.  Avoid having throw rugs at the top or bottom of the stairs. If you do have throw rugs, attach them to the floor with carpet tape.  Make sure that you have a light switch at the top of the stairs and the bottom of the stairs. If you do not have them, ask someone to add them for you. What else can I do to help prevent falls?  Wear shoes that:  Do not have high heels.  Have rubber bottoms.  Are comfortable and fit you well.  Are closed at the toe. Do not wear sandals.  If you  use a stepladder:  Make sure that it is fully opened. Do not climb a closed stepladder.  Make sure that both sides of the stepladder are locked into place.  Ask someone to hold it for you, if possible.  Clearly mark and make sure that you can see:  Any grab bars or handrails.  First and last steps.  Where the edge of each step is.  Use tools that help you move around (mobility aids) if they are needed. These include:  Canes.  Walkers.  Scooters.  Crutches.  Turn on the lights when you go into a dark area. Replace any light bulbs as soon as they burn out.  Set up your furniture so you have a clear path. Avoid moving your furniture around.  If any of your floors are uneven, fix them.  If there are any pets around you, be aware of where they are.  Review your medicines with your doctor. Some medicines can make you feel dizzy. This can increase your chance of falling. Ask your doctor what other things that you can do to help prevent falls. This information is not intended to replace advice given to you by your health care provider. Make sure you discuss any questions you have with your health care provider. Document Released: 04/12/2009 Document Revised: 11/22/2015 Document Reviewed: 07/21/2014 Elsevier Interactive Patient Education  2017 [employer].

## 2020-12-05 NOTE — Progress Notes (Signed)
PCP notes:  Health Maintenance: No gaps noted   Abnormal Screenings: none   Patient concerns: none   Nurse concerns: none   Next PCP appt.: 12/13/2020 @ 11 am

## 2020-12-13 ENCOUNTER — Other Ambulatory Visit: Payer: Self-pay

## 2020-12-13 ENCOUNTER — Encounter: Payer: Self-pay | Admitting: Family Medicine

## 2020-12-13 ENCOUNTER — Ambulatory Visit (INDEPENDENT_AMBULATORY_CARE_PROVIDER_SITE_OTHER): Payer: Medicare Other | Admitting: Family Medicine

## 2020-12-13 VITALS — BP 142/86 | HR 96 | Temp 97.0°F | Ht 62.0 in | Wt 174.0 lb

## 2020-12-13 DIAGNOSIS — R7303 Prediabetes: Secondary | ICD-10-CM

## 2020-12-13 DIAGNOSIS — E039 Hypothyroidism, unspecified: Secondary | ICD-10-CM

## 2020-12-13 DIAGNOSIS — I10 Essential (primary) hypertension: Secondary | ICD-10-CM | POA: Diagnosis not present

## 2020-12-13 DIAGNOSIS — M810 Age-related osteoporosis without current pathological fracture: Secondary | ICD-10-CM

## 2020-12-13 DIAGNOSIS — I5032 Chronic diastolic (congestive) heart failure: Secondary | ICD-10-CM | POA: Diagnosis not present

## 2020-12-13 DIAGNOSIS — E782 Mixed hyperlipidemia: Secondary | ICD-10-CM

## 2020-12-13 DIAGNOSIS — I4821 Permanent atrial fibrillation: Secondary | ICD-10-CM | POA: Diagnosis not present

## 2020-12-13 DIAGNOSIS — H9193 Unspecified hearing loss, bilateral: Secondary | ICD-10-CM

## 2020-12-13 MED ORDER — LOSARTAN POTASSIUM 100 MG PO TABS: 1.0000 | ORAL_TABLET | Freq: Every day | ORAL | 3 refills | Status: DC

## 2020-12-13 MED ORDER — ALENDRONATE SODIUM 70 MG PO TABS: 70.0000 mg | ORAL_TABLET | ORAL | 3 refills | Status: DC

## 2020-12-13 MED ORDER — POTASSIUM CHLORIDE ER 10 MEQ PO TBCR: 10.0000 meq | EXTENDED_RELEASE_TABLET | Freq: Every day | ORAL | 3 refills | Status: DC

## 2020-12-13 NOTE — Assessment & Plan Note (Signed)
Lab Results  Component Value Date   HGBA1C 5.9 12/05/2020   disc imp of low glycemic diet and wt loss to prevent DM2

## 2020-12-13 NOTE — Assessment & Plan Note (Signed)
Under care of cardiology Continues eliquis and diltiazem  HR decreased after sitting

## 2020-12-13 NOTE — Assessment & Plan Note (Signed)
Hypothyroidism  Pt has no clinical changes No change in energy level/ hair or skin/ edema and no tremor Lab Results  Component Value Date   TSH 3.82 12/05/2020    Does not need levothyroxine currently

## 2020-12-13 NOTE — Assessment & Plan Note (Signed)
Disc goals for lipids and reasons to control them Rev last labs with pt Rev low sat fat diet in detail Stable with LDL in 70s w/o medication  HDL is low  Enc her to get back to exercise when able

## 2020-12-13 NOTE — Progress Notes (Signed)
Subjective:    Patient ID: Linda Johnson, female    DOB: 23-Mar-1936, 85 y.o.   MRN: [id number]  This visit occurred during the SARS-CoV-2 public health emergency.  Safety protocols were in place, including screening questions prior to the visit, additional usage of staff PPE, and extensive cleaning of exam room while observing appropriate contact time as indicated for disinfecting solutions.   HPI Pt presents for fannual fu of chronic health problems ,  Wt Readings from Last 3 Encounters:  12/13/20 174 lb (78.9 kg)  11/23/20 171 lb (77.6 kg)  04/03/20 173 lb 6 oz (78.6 kg)   31.83 kg/m  Had covid in May - still getting energy back  Scant cough  Never had shortness of breath   Covid imm with booster Had shingrix  Mammogram 5/22 Sister had breast cancer and leukemia  Self breast exam- no lumps   Dexa 11/21 Osteoporosis , taking alendronate (started in 2021)-planning 5 y  [employer]  D level is 85  Exercise - less since covid   Colon screening - not interested    HTN in setting of chf and a fib  Has done well lately   bp is stable today  No cp or palpitations or headaches or edema  No side effects to medicines  BP Readings from Last 3 Encounters:  12/13/20 (!) 142/86  11/23/20 124/72  04/03/20 (!) 146/70     Pulse Readings from Last 3 Encounters:  12/13/20 96  04/03/20 83  11/29/19 89   Cardizem 120 mg daily Cardura 4 mg bid Lasix 20 mg  Metoprolol xl 25 mg Losartan 100 mg daily  Takes K  Due to see Dr Mariah Milling in Oct -once per year   Lab Results  Component Value Date   CREATININE 1.04 12/05/2020   BUN 19 12/05/2020   NA 140 12/05/2020   K 4.6 12/05/2020   CL 102 12/05/2020   CO2 32 12/05/2020   Hypothyroidism  Pt has no clinical changes No change in energy level/ hair or skin/ edema and no tremor Lab Results  Component Value Date   TSH 3.82 12/05/2020     No medications currently    Hyperlipidemia Lab  Results  Component Value Date   CHOL 133 12/05/2020   CHOL 143 11/24/2019   CHOL 136 11/23/2018   Lab Results  Component Value Date   HDL 37.70 (L) 12/05/2020   HDL 47.20 11/24/2019   HDL 43.10 11/23/2018   Lab Results  Component Value Date   LDLCALC 73 12/05/2020   LDLCALC 77 11/24/2019   LDLCALC 80 11/23/2018   Lab Results  Component Value Date   TRIG 110.0 12/05/2020   TRIG 91.0 11/24/2019   TRIG 66.0 11/23/2018   Lab Results  Component Value Date   CHOLHDL 4 12/05/2020   CHOLHDL 3 11/24/2019   CHOLHDL 3 11/23/2018   Lab Results  Component Value Date   LDLDIRECT 132.4 05/06/2010   LDLDIRECT 107.9 04/02/2007    Prediabetes Lab Results  Component Value Date   HGBA1C 5.9 12/05/2020   This is up from 5.5 She has made the effort to eat more salad  Too tired to cook recently    Patient Active Problem List   Diagnosis Date Noted   History of COVID-19 11/23/2020   Medicare annual wellness visit, subsequent 11/29/2019   Chronic diastolic CHF (congestive heart failure) (HCC) 08/30/2018   Pedal edema 08/23/2018   PVC (premature ventricular contraction) 01/18/2018   Poor  balance 10/05/2017   Post herpetic neuralgia 09/23/2017   Encounter for therapeutic drug monitoring 09/24/2015   Encounter for anticoagulation discussion and counseling 09/20/2015   Irregular heart rate 09/17/2015   Permanent atrial fibrillation (HCC) 09/17/2015   Hearing loss 09/17/2015   Hypothyroidism 12/12/2014   Prediabetes 09/11/2014   Encounter for Medicare annual wellness exam 09/11/2014   Estrogen deficiency 09/11/2014   Routine general medical examination at a health care facility 09/09/2013   Low back pain 03/21/2013   Other screening mammogram 04/19/2012   Post-menopausal 04/19/2012   Obesity 08/18/2011   Hyperlipidemia 06/12/2010   Osteoporosis 06/12/2010   ALLERGIC RHINITIS CAUSE UNSPECIFIED 04/12/2008   HX, PERSONAL, COLONIC POLYPS 04/02/2007   STRABISMUS 03/02/2007    Essential hypertension 03/02/2007   Past Medical History:  Diagnosis Date   Arthritis    hips, knees   Arthritis    Atrial fibrillation (HCC)    chronic   Back pain    Balance problem    Cardiomegaly    Cataract 2020   Right eye December 06, 2018, Left eye date pending   CHF (congestive heart failure) (HCC) 07/2018   chronic   Hypertension    Osteopenia    Pleural effusion 08/25/2018   [facility]   Shingles 08/2017   H/O   SOB (shortness of breath)    Past Surgical History:  Procedure Laterality Date   BREAST BIOPSY Left 2011   2 areas, cysts per pt   CATARACT EXTRACTION W/PHACO Right 12/06/2018   Procedure: CATARACT EXTRACTION PHACO AND INTRAOCULAR LENS PLACEMENT (IOC) RIGHT;  Surgeon: Nevada Crane, MD;  Location: [facility];  Service: Ophthalmology;  Laterality: Right;   CATARACT EXTRACTION W/PHACO Left 01/17/2019   Procedure: CATARACT EXTRACTION PHACO AND INTRAOCULAR LENS PLACEMENT (IOC) LEFT;  Surgeon: Nevada Crane, MD;  Location: [facility];  Service: Ophthalmology;  Laterality: Left;   Social History   Tobacco Use   Smoking status: Never   Smokeless tobacco: Never  Vaping Use   Vaping Use: Never used  Substance Use Topics   Alcohol use: No    Alcohol/week: 0.0 standard drinks   Drug use: No   Family History  Problem Relation Age of Onset   Leukemia Sister    Breast cancer Sister 50   Breast cancer Maternal Aunt 50   Allergies  Allergen Reactions   Ace Inhibitors Cough        Hydrocodone Bit-Homatrop Mbr Nausea Only   Tramadol Hcl Nausea Only        Current Outpatient Medications on File Prior to Visit  Medication Sig Dispense Refill   calcium-vitamin D (OSCAL WITH D) 500-200 MG-UNIT per tablet Take 1 tablet by mouth 2 (two) times daily.     Cholecalciferol (VITAMIN D3) 2000 UNITS TABS Take 1 tablet by mouth daily.      diltiazem (CARDIZEM CD) 120 MG 24 hr capsule TAKE 1 CAPSULE BY MOUTH EVERY DAY 90 capsule 2   doxazosin (CARDURA)  8 MG tablet TAKE 0.5 TABLETS (4 MG TOTAL) BY MOUTH 2 (TWO) TIMES DAILY. 90 tablet 1   ELIQUIS 5 MG TABS tablet TAKE 1 TABLET BY MOUTH TWICE A DAY 180 tablet 1   furosemide (LASIX) 20 MG tablet Take 1 tablet (20 mg) by mouth once daily for weight greater then 173 lbs     latanoprost (XALATAN) 0.005 % ophthalmic solution INSTILL ONE DROP IN BOTH EYES AT BEDTIME.  3   metoprolol succinate (TOPROL-XL) 25 MG 24 hr tablet TAKE  1 TABLET BY MOUTH EVERY DAY 90 tablet 2   Multiple Vitamins-Minerals (HAIR SKIN AND NAILS FORMULA) TABS Take 1 tablet by mouth daily.     No current facility-administered medications on file prior to visit.    Review of Systems  Constitutional:  Positive for fatigue. Negative for activity change, appetite change, fever and unexpected weight change.  HENT:  Negative for congestion, ear pain, rhinorrhea, sinus pressure and sore throat.   Eyes:  Negative for pain, redness and visual disturbance.  Respiratory:  Negative for cough, shortness of breath and wheezing.   Cardiovascular:  Negative for chest pain and palpitations.  Gastrointestinal:  Negative for abdominal pain, blood in stool, constipation and diarrhea.  Endocrine: Negative for polydipsia and polyuria.  Genitourinary:  Negative for dysuria, frequency and urgency.  Musculoskeletal:  Negative for arthralgias, back pain and myalgias.  Skin:  Negative for pallor and rash.  Allergic/Immunologic: Negative for environmental allergies.  Neurological:  Negative for dizziness, syncope and headaches.  Hematological:  Negative for adenopathy. Does not bruise/bleed easily.  Psychiatric/Behavioral:  Negative for decreased concentration and dysphoric mood. The patient is not nervous/anxious.       Objective:   Physical Exam Constitutional:      General: She is not in acute distress.    Appearance: Normal appearance. She is well-developed. She is obese. She is not ill-appearing or diaphoretic.  HENT:     Head: Normocephalic  and atraumatic.     Right Ear: Tympanic membrane, ear canal and external ear normal.     Left Ear: Tympanic membrane, ear canal and external ear normal.     Ears:     Comments: Very hard of hearing    Nose: Nose normal. No congestion.     Mouth/Throat:     Mouth: Mucous membranes are moist.     Pharynx: Oropharynx is clear. No posterior oropharyngeal erythema.  Eyes:     General: No scleral icterus.    Extraocular Movements: Extraocular movements intact.     Conjunctiva/sclera: Conjunctivae normal.     Pupils: Pupils are equal, round, and reactive to light.     Comments: Baseline strabismus  Neck:     Thyroid: No thyromegaly.     Vascular: No carotid bruit or JVD.  Cardiovascular:     Rate and Rhythm: Normal rate and regular rhythm.     Pulses: Normal pulses.     Heart sounds: Normal heart sounds.    No gallop.  Pulmonary:     Effort: Pulmonary effort is normal. No respiratory distress.     Breath sounds: Normal breath sounds. No wheezing.     Comments: Good air exch Chest:     Chest wall: No tenderness.  Abdominal:     General: Bowel sounds are normal. There is no distension or abdominal bruit.     Palpations: Abdomen is soft. There is no mass.     Tenderness: There is no abdominal tenderness.     Hernia: No hernia is present.  Genitourinary:    Comments: Breast exam: No mass, nodules, thickening, tenderness, bulging, retraction, inflamation, nipple discharge or skin changes noted.  No axillary or clavicular LA.   (Exam done sitting in chair)  Musculoskeletal:        General: No tenderness. Normal range of motion.     Cervical back: Normal range of motion and neck supple. No rigidity. No muscular tenderness.     Right lower leg: No edema.     Left lower leg: No edema.  Comments: Mild kyphosis   Lymphadenopathy:     Cervical: No cervical adenopathy.  Skin:    General: Skin is warm and dry.     Coloration: Skin is not pale.     Findings: No erythema or rash.      Comments: Solar lentigines diffusely   Neurological:     Mental Status: She is alert. Mental status is at baseline.     Cranial Nerves: No cranial nerve deficit.     Motor: No abnormal muscle tone.     Coordination: Coordination normal.     Gait: Gait normal.     Deep Tendon Reflexes: Reflexes are normal and symmetric. Reflexes normal.  Psychiatric:        Mood and Affect: Mood normal.        Cognition and Memory: Cognition and memory normal.          Assessment & Plan:   Problem List Items Addressed This Visit       Cardiovascular and Mediastinum   Essential hypertension    bp in fair control at this time  BP Readings from Last 1 Encounters:  12/13/20 (!) 142/86  No changes needed Most recent labs reviewed  Disc lifstyle change with low sodium diet and exercise  Under care of cardiology Plan to continue  Cardizem 120 mg daily Cardura 4 mg bid Lasix 20 mg  Metoprolol xl 25 mg Losartan 100 mg daily        Relevant Medications   losartan (COZAAR) 100 MG tablet   Permanent atrial fibrillation (HCC)    Under care of cardiology Continues eliquis and diltiazem  HR decreased after sitting        Relevant Medications   losartan (COZAAR) 100 MG tablet   Chronic diastolic CHF (congestive heart failure) (HCC) - Primary    Clinically stable under care of cardiology Per pt- enc to not overdo fluids She is adv to take lasix if wt goes over 173       Relevant Medications   losartan (COZAAR) 100 MG tablet     Endocrine   Hypothyroidism    Hypothyroidism  Pt has no clinical changes No change in energy level/ hair or skin/ edema and no tremor Lab Results  Component Value Date   TSH 3.82 12/05/2020    Does not need levothyroxine currently         Nervous and Auditory   Hearing loss    Encouraged pt strongly to pursue audiology eval and hearing aides  She declines for now  Has financial concerns           Musculoskeletal and Integument   Osteoporosis     dexa 11/21 Started alendronate in the fall and planning 5 y course if well tolerated No falls or fractures  D level tx  Plans to inc exercise as she recovers from covid       Relevant Medications   alendronate (FOSAMAX) 70 MG tablet     Other   Hyperlipidemia    Disc goals for lipids and reasons to control them Rev last labs with pt Rev low sat fat diet in detail Stable with LDL in 70s w/o medication  HDL is low  Enc her to get back to exercise when able        Relevant Medications   losartan (COZAAR) 100 MG tablet   Prediabetes    Lab Results  Component Value Date   HGBA1C 5.9 12/05/2020  disc imp of low glycemic diet and  wt loss to prevent DM2

## 2020-12-13 NOTE — Assessment & Plan Note (Signed)
bp in fair control at this time  BP Readings from Last 1 Encounters:  12/13/20 (!) 142/86   No changes needed Most recent labs reviewed  Disc lifstyle change with low sodium diet and exercise  Under care of cardiology Plan to continue  Cardizem 120 mg daily Cardura 4 mg bid Lasix 20 mg  Metoprolol xl 25 mg Losartan 100 mg daily

## 2020-12-13 NOTE — Assessment & Plan Note (Signed)
dexa 11/21 Started alendronate in the fall and planning 5 y course if well tolerated No falls or fractures  D level tx  Plans to inc exercise as she recovers from covid

## 2020-12-13 NOTE — Assessment & Plan Note (Signed)
Clinically stable under care of cardiology Per pt- enc to not overdo fluids She is adv to take lasix if wt goes over 173

## 2020-12-13 NOTE — Patient Instructions (Addendum)
For diabetes prevention  Try to get most of your carbohydrates from produce (with the exception of white potatoes)  Eat less bread/pasta/rice/snack foods/cereals/sweets and other items from the middle of the grocery store (processed carbs)  Get back to exercise when you feel back to normal   Yogurt if good - get unsweetened and add your own fruit   Drink lots of water to keep kidneys healthy  Take care of yourself   It may take another month to get energy level back from covid  When you are ready for hearing aide evaluation please let us know

## 2020-12-13 NOTE — Assessment & Plan Note (Signed)
Encouraged pt strongly to pursue audiology eval and hearing aides  She declines for now  Has financial concerns

## 2021-01-29 ENCOUNTER — Other Ambulatory Visit: Payer: Self-pay | Admitting: Cardiovascular Disease

## 2021-02-24 ENCOUNTER — Other Ambulatory Visit: Payer: Self-pay | Admitting: Cardiovascular Disease

## 2021-02-25 NOTE — Telephone Encounter (Signed)
Eliquis 5 mg refill request received. Patient is 85 years old, weight- 78.9 kg, Crea- 1.04 on 12/05/20 via epic, Diagnosis-paf, and last seen by Dr. Mariah Milling on 04/03/20. Dose is appropriate based on dosing criteria. Will send in refill to requested pharmacy.

## 2021-04-22 ENCOUNTER — Other Ambulatory Visit: Payer: Self-pay | Admitting: Cardiovascular Disease

## 2021-04-22 NOTE — Progress Notes (Signed)
Cardiology Office Note  Date:  04/23/2021   ID:  Linda Johnson, DOB 1936/03/20, MRN [id number]  PCP:  Linda Pimple, MD   Chief Complaint  Patient presents with   12 month follow up     "Doing well." Medications reviewed by the patient verbally.     HPI:  Linda Johnson is a 85 y.o. woman with a hx of  Permanent atrial fibrillation,since 2017 previously declined cardioversion On anticoagulation/eliquis hypertension who presents for routine follow-up of her  PVCs, atrial fibrillation  In follow-up today reports she is doing well Covid in may 2022 Legs weak, trying to get her strength back Very sedentary, family encouraging her not to go outside the house Walks around the house with her walker, denies any recent falls  Groceries delivered, Mail delivered, [facility] helps her with errands  Not on lasix Weight stable Denies any leg swelling  Eliquis expensive In the donut hole.  Does not know the price offhand  Lab work reviewed HBA1C 5.9 Total chol 133  EKG personally reviewed by myself on todays visit Atrial fib rate 86 bpm no significant ST-T wave changes  Other past medical history reviewed Prior history of supratherapeutic INR of 7 on warfarin Changed to Eliquis  Previously felt she had nausea from the low-dose amiodarone  Atrial fibrillation picked up by primary care on routine EKG early 2017    PMH:   has a past medical history of Arthritis, Arthritis, Atrial fibrillation (HCC), Back pain, Balance problem, Cardiomegaly, Cataract (2020), CHF (congestive heart failure) (HCC) (07/2018), Hypertension, Osteopenia, Pleural effusion (08/25/2018), Shingles (08/2017), and SOB (shortness of breath).  PSH:    Past Surgical History:  Procedure Laterality Date   BREAST BIOPSY Left 2011   2 areas, cysts per pt   CATARACT EXTRACTION W/PHACO Right 12/06/2018   Procedure: CATARACT EXTRACTION PHACO AND INTRAOCULAR LENS PLACEMENT (IOC) RIGHT;  Surgeon: Nevada Crane,  MD;  Location: [facility];  Service: Ophthalmology;  Laterality: Right;   CATARACT EXTRACTION W/PHACO Left 01/17/2019   Procedure: CATARACT EXTRACTION PHACO AND INTRAOCULAR LENS PLACEMENT (IOC) LEFT;  Surgeon: Nevada Crane, MD;  Location: [facility];  Service: Ophthalmology;  Laterality: Left;    Current Outpatient Medications  Medication Sig Dispense Refill   alendronate (FOSAMAX) 70 MG tablet Take 1 tablet (70 mg total) by mouth every 7 (seven) days. Take with a full glass of water on an empty stomach. 12 tablet 3   calcium-vitamin D (OSCAL WITH D) 500-200 MG-UNIT per tablet Take 1 tablet by mouth 2 (two) times daily.     Cholecalciferol (VITAMIN D3) 2000 UNITS TABS Take 1 tablet by mouth daily.      diltiazem (CARDIZEM CD) 120 MG 24 hr capsule TAKE 1 CAPSULE BY MOUTH EVERY DAY 90 capsule 0   doxazosin (CARDURA) 8 MG tablet TAKE 1/2 TABLETS (4 MG TOTAL) BY MOUTH 2 (TWO) TIMES DAILY. 90 tablet 1   ELIQUIS 5 MG TABS tablet TAKE 1 TABLET BY MOUTH TWICE A DAY 60 tablet 5   furosemide (LASIX) 20 MG tablet Take 1 tablet (20 mg) by mouth once daily for weight greater then 173 lbs     latanoprost (XALATAN) 0.005 % ophthalmic solution INSTILL ONE DROP IN BOTH EYES AT BEDTIME.  3   losartan (COZAAR) 100 MG tablet Take 1 tablet (100 mg total) by mouth daily. 90 tablet 3   metoprolol succinate (TOPROL-XL) 25 MG 24 hr tablet TAKE 1 TABLET BY MOUTH EVERY DAY 90 tablet  2   Multiple Vitamins-Minerals (HAIR SKIN AND NAILS FORMULA) TABS Take 1 tablet by mouth once a week.     potassium chloride (KLOR-CON) 10 MEQ tablet Take 1 tablet (10 mEq total) by mouth daily. 90 tablet 3   No current facility-administered medications for this visit.    Allergies:   Ace inhibitors, Hydrocodone bit-homatrop mbr, and Tramadol hcl   Social History:  The patient  reports that she has never smoked. She has never used smokeless tobacco. She reports that she does not drink alcohol and does not use drugs.    Family History:   family history includes Breast cancer (age of onset: 23) in her maternal aunt; Breast cancer (age of onset: 44) in her sister; Leukemia in her sister.    Review of Systems: Review of Systems  HENT: Negative.    Eyes: Negative.   Respiratory: Negative.    Cardiovascular: Negative.   Gastrointestinal: Negative.   Genitourinary: Negative.   Musculoskeletal: Negative.   Neurological: Negative.   Psychiatric/Behavioral: Negative.    All other systems reviewed and are negative.  PHYSICAL EXAM: VS:  BP 140/80 (BP Location: Left Arm, Patient Position: Sitting, Cuff Size: Normal)   Pulse 86   Ht 5\' 3"  (1.6 m)   Wt 172 lb 6 oz (78.2 kg)   LMP  (LMP Unknown)   SpO2 96%   BMI 30.53 kg/m  , BMI Body mass index is 30.53 kg/m.  Constitutional:  oriented to person, place, and time. No distress.  HENT:  Head: Grossly normal Eyes:  no discharge. No scleral icterus.  Neck: No JVD, no carotid bruits  Cardiovascular: Regular rate and rhythm, no murmurs appreciated Pulmonary/Chest: Clear to auscultation bilaterally, no wheezes or rails Abdominal: Soft.  no distension.  no tenderness.  Musculoskeletal: Normal range of motion Neurological:  normal muscle tone. Coordination normal. No atrophy Skin: Skin warm and dry Psychiatric: normal affect, pleasant   Recent Labs: 12/05/2020: ALT 9; BUN 19; Creatinine, Ser 1.04; Hemoglobin 14.5; Platelets 194.0; Potassium 4.6; Sodium 140; TSH 3.82    Lipid Panel Lab Results  Component Value Date   CHOL 133 12/05/2020   HDL 37.70 (L) 12/05/2020   LDLCALC 73 12/05/2020   TRIG 110.0 12/05/2020      Wt Readings from Last 3 Encounters:  04/23/21 172 lb 6 oz (78.2 kg)  12/13/20 174 lb (78.9 kg)  11/23/20 171 lb (77.6 kg)     ASSESSMENT AND PLAN:  Chronic diastolic CHF continue to take Lasix 20 mg for weight 173 pounds or higher on her home scale Weight remaining low 170 range, appears euvolemic  Permanent atrial fibrillation  (HCC)  Tolerating Eliquis 5 mg twice a day , Rate control with diltiazem 120 daily We will look into patient assistance for her Eliquis at her request  Essential hypertension Blood pressure is well controlled on today's visit. No changes made to the medications.  Gait instability I am concerned about gait instability, leg weakness, Recommended walking program, she is blaming her family that they do not want her going outside the house  PVCs Not having any symptoms     Total encounter time more than 25 minutes  Greater than 50% was spent in counseling and coordination of care with the patient     Signed, 11/25/20, M.D., Ph.D. 04/23/2021  [facility] [place], [place]

## 2021-04-23 ENCOUNTER — Other Ambulatory Visit: Payer: Self-pay

## 2021-04-23 ENCOUNTER — Encounter: Payer: Self-pay | Admitting: Cardiovascular Disease

## 2021-04-23 ENCOUNTER — Ambulatory Visit (INDEPENDENT_AMBULATORY_CARE_PROVIDER_SITE_OTHER): Payer: Medicare Other | Admitting: Cardiovascular Disease

## 2021-04-23 VITALS — BP 140/80 | HR 86 | Ht 63.0 in | Wt 172.4 lb

## 2021-04-23 DIAGNOSIS — E782 Mixed hyperlipidemia: Secondary | ICD-10-CM

## 2021-04-23 DIAGNOSIS — I493 Ventricular premature depolarization: Secondary | ICD-10-CM | POA: Diagnosis not present

## 2021-04-23 DIAGNOSIS — I10 Essential (primary) hypertension: Secondary | ICD-10-CM

## 2021-04-23 DIAGNOSIS — I482 Chronic atrial fibrillation, unspecified: Secondary | ICD-10-CM

## 2021-04-23 DIAGNOSIS — I5032 Chronic diastolic (congestive) heart failure: Secondary | ICD-10-CM | POA: Diagnosis not present

## 2021-04-23 MED ORDER — DOXAZOSIN MESYLATE 8 MG PO TABS: ORAL_TABLET | ORAL | 3 refills | Status: DC

## 2021-04-23 MED ORDER — LOSARTAN POTASSIUM 100 MG PO TABS: 100.0000 mg | ORAL_TABLET | Freq: Every day | ORAL | 3 refills | Status: DC

## 2021-04-23 MED ORDER — FUROSEMIDE 20 MG PO TABS: ORAL_TABLET | ORAL | 3 refills | Status: DC

## 2021-04-23 MED ORDER — METOPROLOL SUCCINATE ER 25 MG PO TB24: 25.0000 mg | ORAL_TABLET | Freq: Every day | ORAL | 3 refills | Status: DC

## 2021-04-23 MED ORDER — DILTIAZEM HCL ER COATED BEADS 120 MG PO CP24: ORAL_CAPSULE | ORAL | 3 refills | Status: DC

## 2021-04-23 MED ORDER — POTASSIUM CHLORIDE ER 10 MEQ PO TBCR: 10.0000 meq | EXTENDED_RELEASE_TABLET | Freq: Every day | ORAL | 3 refills | Status: DC

## 2021-04-23 NOTE — Patient Instructions (Addendum)
Eliquis assistance application  [email or web address] [phone])   Medication Instructions:  No changes  If you need a refill on your cardiac medications before your next appointment, please call your pharmacy.   Lab work: No new labs needed  Testing/Procedures: No new testing needed  Follow-Up: At [facility], you and your health needs are our priority.  As part of our continuing mission to provide you with exceptional heart care, we have created designated Provider Care Teams.  These Care Teams include your primary Cardiologist (physician) and Advanced Practice Providers (APPs -  Physician Assistants and Nurse Practitioners) who all work together to provide you with the care you need, when you need it.  You will need a follow up appointment in 12 months  Providers on your designated Care Team:   Nicolasa Ducking, NP Eula Listen, PA-C Cadence Fransico [place], [place]  COVID-19 Vaccine Information can be found at: [email or web address] For questions related to vaccine distribution or appointments, please email [email or web address] or call 6802092646.

## 2021-04-29 ENCOUNTER — Telehealth: Payer: Self-pay | Admitting: Cardiovascular Disease

## 2021-04-29 NOTE — Telephone Encounter (Signed)
Able to reach back out to Mrs. Ahrendt, she is going to mail in the provider's portion of her Eliquis PA application. Dr. Mariah Milling will sign and she would prefer if we mailed it back to her so she may mail it herself to the [employer] for PA.   Will await application and send back to Mrs. Ketcham. At this time nothing further, Mrs. Pharris thankful for the return call.

## 2021-04-29 NOTE — Telephone Encounter (Signed)
Pt c/o medication issue:  1. Name of Medication: ELIQUIS  2. How are you currently taking this medication (dosage and times per day)? 5 MG PO BID  3. Are you having a reaction (difficulty breathing--STAT)? NO  4. What is your medication issue? PATIENT WANTS TO DISCUSS medication assistance forms.  She would like to mail application to our office for completion.    Please call to discuss per patient request

## 2021-05-03 ENCOUNTER — Telehealth: Payer: Self-pay

## 2021-05-03 NOTE — Telephone Encounter (Signed)
Received PA application by mail from pt Completed provider's portion, attached copy of insurance card and med list Dr. Mariah Milling has signed Form mailed back to pt as requested and a copy of provider's part placed in file cabinet

## 2021-06-10 ENCOUNTER — Telehealth: Payer: Self-pay

## 2021-06-10 NOTE — Telephone Encounter (Signed)
Incoming fax, pt is APPROVED for Eliquis from 06/07/2021 to 06/29/2021  Pt will need to reapply for the 2023 year staring Jan for 2023 coverage  [id number]

## 2021-06-11 NOTE — Telephone Encounter (Signed)
Was able to return call to Linda Johnson regarding PA for Eliquis, advised will need to reapply each calender year. Will need to complete application and send in proof of income and out of pocket expense from pharmacy.  Will also have to meet the 3% out-of-pocket prescription expense is in addition to our other eligibility criteria, including financial eligibility. We may not be able to process your application until we receive documentation of these out-of-pocket prescription expenses.    They are shipping pt 70-month supply this month, will last till March, advised in March if Eliquis does not send reminder letter with renewal application, then call office and we will provide another application.  Linda Johnson thankful for the return call and advice for Eliquis. Will call back with any further concerns.

## 2021-06-11 NOTE — Telephone Encounter (Signed)
Patient would like to discuss the process to reapply for the 2023 year. Please call.

## 2021-08-26 ENCOUNTER — Telehealth: Payer: Self-pay | Admitting: Cardiovascular Disease

## 2021-08-26 NOTE — Telephone Encounter (Signed)
Pt c/o Shortness Of Breath: STAT if SOB developed within the last 24 hours or pt is noticeably SOB on the phone  1. Are you currently SOB (can you hear that pt is SOB on the phone)? yes  2. How long have you been experiencing SOB? Yesterday  3. Are you SOB when sitting or when up moving around? Moving around  4. Are you currently experiencing any other symptoms? no

## 2021-08-26 NOTE — Telephone Encounter (Signed)
Spoke with patient and reviewed her concerns. She reports shortness of breath but it is better than it was yesterday. Inquired what her weight was and she did not weigh today. She admits not weighing daily. She has not been taking her furosemide and advised instructions are to take AS NEEDED for weight gain. Encouraged her to weigh daily so we can determine if she has excess fluid on board. Recommended she take 1 tablet of Furosemide 20 mg once today and see if her breathing improves. Instructed her to call back tomorrow to let us know if that helped with her breathing. She states the shortness of breath is more when moving around. Last office visit it was noted "Very sedentary, family encouraging her not to go outside the house". Discussed deconditioning and how that can also contribute to her shortness of breath when moving around. She verbalized understanding to take medication to see if that will help and to let us know if it improves. No further questions at this time.

## 2021-08-27 MED ORDER — FUROSEMIDE 20 MG PO TABS: ORAL_TABLET | ORAL | 3 refills | Status: DC

## 2021-08-27 NOTE — Telephone Encounter (Signed)
Patient calling to check status of callback. States now she has seen improvement but SOB is still an issue.

## 2021-08-27 NOTE — Telephone Encounter (Signed)
Called patient back and she informed me that she is feeling better with the Lasix. She is requesting a follow up appointment with Dr. Rockey Situ. The soonest available appointment was 09/23/21, and patient was agreeable to take it. She stated that she would let us know if her SOB returns or gets any worse.

## 2021-08-27 NOTE — Telephone Encounter (Signed)
Per patient sob has improved and she will need a refill of the furosemide if needing to still take it .   Please call.

## 2021-09-22 NOTE — Progress Notes (Signed)
Cardiology Office Note ? ?Date:  09/23/2021  ? ?ID:  Linda Johnson, DOB 07/21/1935, MRN [id number] ? ?PCP:  Judy Pimple, MD  ? ?Chief Complaint  ?Patient presents with  ? Shortness of Breath  ?  Medications reviewed by the patient verbally.   ? ? ?HPI:  ?[name] is a 86 y.o. woman with a hx of  ?Permanent atrial fibrillation,since 2017 previously declined cardioversion ?On anticoagulation/eliquis ?hypertension ?who presents for routine follow-up of her  PVCs, atrial fibrillation ? ?LOV 10/22 ? ?Presents today with her daughter ?Recently called our office 1 month ago with worsening shortness of breath ?It was recommended by our nurses that she start Lasix daily ?She reports since then her shortness of breath has resolved, now back to her baseline ?On potassium daily ?No recent lab work available ? ?No regular exercise program, walks with a walker, no falls ?Weight at home 170 to 174 but does not check on a regular basis ?Did not check weight when she had symptoms ? ?EKG personally reviewed by myself on todays visit ?Atrial fibrillation with ventricular rate 89 bpm nonspecific ST abnormality ? ?Other past medical history reviewed ?Covid in may 2022 ? ?Prior history of supratherapeutic INR of 7 on warfarin ?Changed to Eliquis ? ?Previously felt she had nausea from the low-dose amiodarone ? ?Atrial fibrillation picked up by primary care on routine EKG early 2017 ?  ? ?PMH:   has a past medical history of Arthritis, Arthritis, Atrial fibrillation (HCC), Back pain, Balance problem, Cardiomegaly, Cataract (2020), CHF (congestive heart failure) (HCC) (07/2018), Hypertension, Osteopenia, Pleural effusion (08/25/2018), Shingles (08/2017), and SOB (shortness of breath). ? ?PSH:    ?Past Surgical History:  ?Procedure Laterality Date  ? BREAST BIOPSY Left 2011  ? 2 areas, cysts per pt  ? CATARACT EXTRACTION W/PHACO Right 12/06/2018  ? Procedure: CATARACT EXTRACTION PHACO AND INTRAOCULAR LENS PLACEMENT (IOC) RIGHT;  Surgeon:  [place] Crane, MD;  Location: [facility];  Service: Ophthalmology;  Laterality: Right;  ? CATARACT EXTRACTION W/PHACO Left 01/17/2019  ? Procedure: CATARACT EXTRACTION PHACO AND INTRAOCULAR LENS PLACEMENT (IOC) LEFT;  Surgeon: [place] Crane, MD;  Location: [facility];  Service: Ophthalmology;  Laterality: Left;  ? ? ?Current Outpatient Medications  ?Medication Sig Dispense Refill  ? alendronate (FOSAMAX) 70 MG tablet Take 1 tablet (70 mg total) by mouth every 7 (seven) days. Take with a full glass of water on an empty stomach. 12 tablet 3  ? calcium-vitamin D (OSCAL WITH D) 500-200 MG-UNIT per tablet Take 1 tablet by mouth 2 (two) times daily.    ? Cholecalciferol (VITAMIN D3) 2000 UNITS TABS Take 1 tablet by mouth daily.     ? diltiazem (CARDIZEM CD) 120 MG 24 hr capsule TAKE 1 CAPSULE BY MOUTH EVERY DAY 90 capsule 3  ? doxazosin (CARDURA) 8 MG tablet TAKE 1/2 TABLETS (4 MG TOTAL) BY MOUTH 2 (TWO) TIMES DAILY. 90 tablet 3  ? ELIQUIS 5 MG TABS tablet TAKE 1 TABLET BY MOUTH TWICE A DAY 60 tablet 5  ? furosemide (LASIX) 20 MG tablet Take 20 mg by mouth daily.    ? latanoprost (XALATAN) 0.005 % ophthalmic solution INSTILL ONE DROP IN BOTH EYES AT BEDTIME.  3  ? losartan (COZAAR) 100 MG tablet Take 1 tablet (100 mg total) by mouth daily. 90 tablet 3  ? metoprolol succinate (TOPROL-XL) 25 MG 24 hr tablet Take 1 tablet (25 mg total) by mouth daily. 90 tablet 3  ? Multiple Vitamins-Minerals (  HAIR SKIN AND NAILS FORMULA) TABS Take 1 tablet by mouth once a week.    ? potassium chloride (KLOR-CON) 10 MEQ tablet Take 1 tablet (10 mEq total) by mouth daily. 90 tablet 3  ? ?No current facility-administered medications for this visit.  ? ? ?Allergies:   Ace inhibitors, Hydrocodone bit-homatrop mbr, and Tramadol hcl  ? ?Social History:  The patient  reports that she has never smoked. She has never used smokeless tobacco. She reports that she does not drink alcohol and does not use drugs.  ? ?Family  History:   family history includes Breast cancer (age of onset: 3850) in her maternal aunt; Breast cancer (age of onset: 5765) in her sister; Leukemia in her sister.  ? ? ?Review of Systems: ?Review of Systems  ?HENT: Negative.    ?Eyes: Negative.   ?Respiratory: Negative.    ?Cardiovascular: Negative.   ?Gastrointestinal: Negative.   ?Genitourinary: Negative.   ?Musculoskeletal: Negative.   ?Neurological: Negative.   ?Psychiatric/Behavioral: Negative.    ?All other systems reviewed and are negative. ? ?PHYSICAL EXAM: ?VS:  BP 140/80 (BP Location: Left Arm, Patient Position: Sitting, Cuff Size: Normal)   Pulse 89   Ht 5\' 5"  (1.651 m)   Wt 170 lb 8 oz (77.3 kg)   LMP  (LMP Unknown)   SpO2 97%   BMI 28.37 kg/m?  , BMI Body mass index is 28.37 kg/m?Marland Kitchen.  ?Constitutional:  oriented to person, place, and time. No distress.  ?HENT:  ?Head: Grossly normal ?Eyes:  no discharge. No scleral icterus.  ?Neck: No JVD, no carotid bruits  ?Cardiovascular: Irregularly irregular, no murmurs appreciated ?Pulmonary/Chest: Clear to auscultation bilaterally, no wheezes or rails ?Abdominal: Soft.  no distension.  no tenderness.  ?Musculoskeletal: Normal range of motion ?Neurological:  normal muscle tone. Coordination normal. No atrophy ?Skin: Skin warm and dry ?Psychiatric: normal affect, pleasant ? ?Recent Labs: ?12/05/2020: ALT 9; BUN 19; Creatinine, Ser 1.04; Hemoglobin 14.5; Platelets 194.0; Potassium 4.6; Sodium 140; TSH 3.82  ? ? ?Lipid Panel ?Lab Results  ?Component Value Date  ? CHOL 133 12/05/2020  ? HDL 37.70 (L) 12/05/2020  ? LDLCALC 73 12/05/2020  ? TRIG 110.0 12/05/2020  ? ?  ? ?Wt Readings from Last 3 Encounters:  ?09/23/21 170 lb 8 oz (77.3 kg)  ?04/23/21 172 lb 6 oz (78.2 kg)  ?12/13/20 174 lb (78.9 kg)  ?  ? ?ASSESSMENT AND PLAN: ? ?Chronic diastolic CHF ?Received phone call in February as she was short of breath, she started Lasix daily with improved symptoms now back to baseline ?Prior instructions were to take Lasix for  weight 173 pounds or higher on her home scale ?She has not been weighing on a regular basis ?Recommend she continue Lasix daily for now.  High fluid intake, some frozen products from the freezer ?Discussed in detail on today's visit ?BMP and BNP ordered ? ?Permanent atrial fibrillation (HCC)  ?Tolerating Eliquis 5 mg twice a day , ?Rate control with diltiazem 120 daily ?We will continue with the patient assistance for Eliquis ? ?Essential hypertension ?Blood pressure is well controlled on today's visit. No changes made to the medications. ? ?Gait instability ?Continued gait instability, leg weakness, ?No regular exercise program ? ?PVCs ?Not having any symptoms ?No further work-up.  Rare PVC on rhythm strip today ? ? ? Total encounter time more than 40 minutes ? Greater than 50% was spent in counseling and coordination of care with the patient ? ? ? ? ?Signed, ?Dossie Arbourim Braedon Sjogren, M.D.,  Ph.D. ?09/23/2021  ? [facility] [place] ?[phone] ? ? ?

## 2021-09-23 ENCOUNTER — Encounter: Payer: Self-pay | Admitting: Cardiovascular Disease

## 2021-09-23 ENCOUNTER — Telehealth: Payer: Self-pay | Admitting: Cardiovascular Disease

## 2021-09-23 ENCOUNTER — Ambulatory Visit (INDEPENDENT_AMBULATORY_CARE_PROVIDER_SITE_OTHER): Payer: Medicare Other | Admitting: Cardiovascular Disease

## 2021-09-23 ENCOUNTER — Other Ambulatory Visit: Payer: Self-pay

## 2021-09-23 VITALS — BP 140/80 | HR 89 | Ht 65.0 in | Wt 170.5 lb

## 2021-09-23 DIAGNOSIS — I5032 Chronic diastolic (congestive) heart failure: Secondary | ICD-10-CM

## 2021-09-23 DIAGNOSIS — I10 Essential (primary) hypertension: Secondary | ICD-10-CM

## 2021-09-23 DIAGNOSIS — I482 Chronic atrial fibrillation, unspecified: Secondary | ICD-10-CM | POA: Diagnosis not present

## 2021-09-23 DIAGNOSIS — I493 Ventricular premature depolarization: Secondary | ICD-10-CM | POA: Diagnosis not present

## 2021-09-23 DIAGNOSIS — E782 Mixed hyperlipidemia: Secondary | ICD-10-CM | POA: Diagnosis not present

## 2021-09-23 MED ORDER — APIXABAN 5 MG PO TABS: 5.0000 mg | ORAL_TABLET | Freq: Two times a day (BID) | ORAL | 11 refills | Status: DC

## 2021-09-23 NOTE — Telephone Encounter (Signed)
Patient will mail eliquis assistance form .  Please mail back to her .  ?

## 2021-09-23 NOTE — Patient Instructions (Addendum)
Medication Instructions:  ?No changes ? ?If you need a refill on your cardiac medications before your next appointment, please call your pharmacy.  ? ? ?Lab work: ?- Your physician recommends that you have lab work today: BMP and BNP ? ? ? ?Testing/Procedures: ?No new testing needed ? ? ? ?Follow-Up: ?At [facility], you and your health needs are our priority.  As part of our continuing mission to provide you with exceptional heart care, we have created designated Provider Care Teams.  These Care Teams include your primary Cardiologist (physician) and Advanced Practice Providers (APPs -  Physician Assistants and Nurse Practitioners) who all work together to provide you with the care you need, when you need it. ? ?You will need a follow up appointment in 6 months ? ?Providers on your designated Care Team:   ?Nicolasa Ducking, NP ?Eula Listen, PA-C ?Cadence Fransico Michael, PA-C ? ?COVID-19 Vaccine Information can be found at: [email or web address] For questions related to vaccine distribution or appointments, please email [email or web address] or call [phone].  ? ?

## 2021-09-24 LAB — BASIC METABOLIC PANEL WITH GFR
BUN/Creatinine Ratio: 18 (ref 12–28)
BUN: 17 mg/dL (ref 8–27)
CO2: 30 mmol/L — ABNORMAL HIGH (ref 20–29)
Calcium: 9.6 mg/dL (ref 8.7–10.3)
Chloride: 101 mmol/L (ref 96–106)
Creatinine, Ser: 0.96 mg/dL (ref 0.57–1.00)
Glucose: 110 mg/dL — ABNORMAL HIGH (ref 70–99)
Potassium: 4.7 mmol/L (ref 3.5–5.2)
Sodium: 144 mmol/L (ref 134–144)
eGFR: 59 mL/min/1.73 — ABNORMAL LOW

## 2021-09-24 LAB — BRAIN NATRIURETIC PEPTIDE: BNP: 81.6 pg/mL (ref 0.0–100.0)

## 2021-09-25 ENCOUNTER — Telehealth: Payer: Self-pay | Admitting: Cardiovascular Disease

## 2021-09-25 NOTE — Telephone Encounter (Signed)
Patient calling to check status of lab results.  °

## 2021-09-26 NOTE — Telephone Encounter (Signed)
Called and spoke with pt. Pt requesting lab results that were completed 08/2721.  ?Reviewed preliminary results of BMP and BNP 09/23/21.  ?Notified pt once Dr. Rockey Situ reviews with recc we will call pt.  ?Pt voiced understanding and appreciative of call.  ? ?Pt also reports that she will mail pt assistance application for Eliquis "in a few months". Pt states once she has met eligibility requirement she will mail paperwork and we may mail back to pt and she will send to [facility].  ? ?Pt states that she is able to get Eliquis from pharmacy right now and does have on hand.  ? ?Pt has no further needs at this time.  ?

## 2021-09-26 NOTE — Telephone Encounter (Signed)
Patient calling to check on status.

## 2021-10-14 ENCOUNTER — Other Ambulatory Visit: Payer: Self-pay | Admitting: Family Medicine

## 2021-10-14 DIAGNOSIS — Z1231 Encounter for screening mammogram for malignant neoplasm of breast: Secondary | ICD-10-CM

## 2021-11-17 ENCOUNTER — Other Ambulatory Visit: Payer: Self-pay | Admitting: Family Medicine

## 2021-12-03 ENCOUNTER — Ambulatory Visit
Admission: RE | Admit: 2021-12-03 | Discharge: 2021-12-03 | Disposition: A | Discharge status: Home / Self Care | Payer: Medicare Other | Source: Ambulatory Visit | Attending: Family Medicine | Admitting: Family Medicine

## 2021-12-03 DIAGNOSIS — Z1231 Encounter for screening mammogram for malignant neoplasm of breast: Secondary | ICD-10-CM | POA: Diagnosis not present

## 2021-12-06 ENCOUNTER — Ambulatory Visit: Payer: Medicare Other

## 2021-12-09 ENCOUNTER — Telehealth: Payer: Self-pay

## 2021-12-09 NOTE — Telephone Encounter (Signed)
Patient is calling stating that she would like a call back about mammogram results.

## 2021-12-10 NOTE — Telephone Encounter (Signed)
Results given to patient by telephone and she verbalized understanding.

## 2021-12-11 ENCOUNTER — Ambulatory Visit (INDEPENDENT_AMBULATORY_CARE_PROVIDER_SITE_OTHER): Payer: Medicare Other

## 2021-12-11 ENCOUNTER — Telehealth: Payer: Self-pay | Admitting: Family Medicine

## 2021-12-11 VITALS — Wt 170.0 lb

## 2021-12-11 DIAGNOSIS — E782 Mixed hyperlipidemia: Secondary | ICD-10-CM

## 2021-12-11 DIAGNOSIS — I10 Essential (primary) hypertension: Secondary | ICD-10-CM

## 2021-12-11 DIAGNOSIS — E039 Hypothyroidism, unspecified: Secondary | ICD-10-CM

## 2021-12-11 DIAGNOSIS — Z0000 Encounter for general adult medical examination without abnormal findings: Secondary | ICD-10-CM

## 2021-12-11 DIAGNOSIS — R7303 Prediabetes: Secondary | ICD-10-CM

## 2021-12-11 DIAGNOSIS — M810 Age-related osteoporosis without current pathological fracture: Secondary | ICD-10-CM

## 2021-12-11 NOTE — Progress Notes (Signed)
Virtual Visit via Telephone Note  I connected with  Linda Johnson on 12/11/21 at 10:30 AM EDT by telephone and verified that I am speaking with the correct person using two identifiers.  Location: Patient: home Provider: [facility] Persons participating in the virtual visit: patient/Nurse Health Advisor   I discussed the limitations, risks, security and privacy concerns of performing an evaluation and management service by telephone and the availability of in person appointments. The patient expressed understanding and agreed to proceed.  Interactive audio and video telecommunications were attempted between this nurse and patient, however failed, due to patient having technical difficulties OR patient did not have access to video capability.  We continued and completed visit with audio only.  Some vital signs may be absent or patient reported.   Hal Hope, LPN  Subjective:   Linda Johnson is a 86 y.o. female who presents for Medicare Annual (Subsequent) preventive examination.  Review of Systems     Cardiac Risk Factors include: advanced age (>37men, >58 women);hypertension     Objective:    There were no vitals filed for this visit. There is no height or weight on file to calculate BMI.     12/11/2021   10:39 AM 12/05/2020   12:05 PM 01/17/2019   10:44 AM 12/06/2018   10:03 AM 11/23/2018   12:23 PM 08/25/2018    5:00 PM 08/24/2018   10:02 PM  Advanced Directives  Does Patient Have a Medical Advance Directive? Yes Yes Yes Yes Yes Yes No  Type of Estate agent of [place];Living will Healthcare Power of [place];Living will Healthcare Power of [place];Living will Healthcare Power of [place];Living will Healthcare Power of [place];Living will Healthcare Power of Attorney   Does patient want to make changes to medical advance directive? Yes (Inpatient - patient defers changing a medical advance directive and declines information at this time)  Yes  (MAU/Ambulatory/Procedural Areas - Information given) No - Patient declined  No - Patient declined   Copy of Healthcare Power of Attorney in Chart? Yes - validated most recent copy scanned in chart (See row information) Yes - validated most recent copy scanned in chart (See row information) No - copy requested Yes - validated most recent copy scanned in chart (See row information) No - copy requested    Would patient like information on creating a medical advance directive?      No - Patient declined No - Patient declined    Current Medications (verified) Outpatient Encounter Medications as of 12/11/2021  Medication Sig   alendronate (FOSAMAX) 70 MG tablet TAKE 1 TABLET (70 MG TOTAL) EVERY 7 DAYS. TAKE WITH A FULL GLASS OF WATER ON AN EMPTY STOMACH.   apixaban (ELIQUIS) 5 MG TABS tablet Take 1 tablet (5 mg total) by mouth 2 (two) times daily.   calcium-vitamin D (OSCAL WITH D) 500-200 MG-UNIT per tablet Take 1 tablet by mouth 2 (two) times daily.   Cholecalciferol (VITAMIN D3) 2000 UNITS TABS Take 1 tablet by mouth daily.    diltiazem (CARDIZEM CD) 120 MG 24 hr capsule TAKE 1 CAPSULE BY MOUTH EVERY DAY   doxazosin (CARDURA) 8 MG tablet TAKE 1/2 TABLETS (4 MG TOTAL) BY MOUTH 2 (TWO) TIMES DAILY.   furosemide (LASIX) 20 MG tablet Take 20 mg by mouth daily.   latanoprost (XALATAN) 0.005 % ophthalmic solution INSTILL ONE DROP IN BOTH EYES AT BEDTIME.   losartan (COZAAR) 100 MG tablet Take 1 tablet (100 mg total) by mouth daily.  metoprolol succinate (TOPROL-XL) 25 MG 24 hr tablet Take 1 tablet (25 mg total) by mouth daily.   Multiple Vitamins-Minerals (HAIR SKIN AND NAILS FORMULA) TABS Take 1 tablet by mouth once a week.   potassium chloride (KLOR-CON) 10 MEQ tablet Take 1 tablet (10 mEq total) by mouth daily.   No facility-administered encounter medications on file as of 12/11/2021.    Allergies (verified) Ace inhibitors, Hydrocodone bit-homatrop mbr, and Tramadol hcl   History: Past Medical  History:  Diagnosis Date   Arthritis    hips, knees   Arthritis    Atrial fibrillation (HCC)    chronic   Back pain    Balance problem    Cardiomegaly    Cataract 2020   Right eye December 06, 2018, Left eye date pending   CHF (congestive heart failure) (HCC) 07/2018   chronic   Hypertension    Osteopenia    Pleural effusion 08/25/2018   [facility]   Shingles 08/2017   H/O   SOB (shortness of breath)    Past Surgical History:  Procedure Laterality Date   BREAST BIOPSY Left 2011   2 areas, cysts per pt   CATARACT EXTRACTION W/PHACO Right 12/06/2018   Procedure: CATARACT EXTRACTION PHACO AND INTRAOCULAR LENS PLACEMENT (IOC) RIGHT;  Surgeon: Nevada Crane, MD;  Location: [facility];  Service: Ophthalmology;  Laterality: Right;   CATARACT EXTRACTION W/PHACO Left 01/17/2019   Procedure: CATARACT EXTRACTION PHACO AND INTRAOCULAR LENS PLACEMENT (IOC) LEFT;  Surgeon: Nevada Crane, MD;  Location: [facility];  Service: Ophthalmology;  Laterality: Left;   Family History  Problem Relation Age of Onset   Leukemia Sister    Breast cancer Sister 51   Breast cancer Maternal Aunt 9   Social History   Socioeconomic History   Marital status: Divorced    Spouse name: Not on file   Number of children: Not on file   Years of education: Not on file   Highest education level: Not on file  Occupational History   Not on file  Tobacco Use   Smoking status: Never   Smokeless tobacco: Never  Vaping Use   Vaping Use: Never used  Substance and Sexual Activity   Alcohol use: No    Alcohol/week: 0.0 standard drinks of alcohol   Drug use: No   Sexual activity: Never  Other Topics Concern   Not on file  Social History Narrative   Not on file   Social Determinants of Health   Financial Resource Strain: Low Risk  (12/11/2021)   Overall Financial Resource Strain (CARDIA)    Difficulty of Paying Living Expenses: Not hard at all  Food Insecurity: No Food Insecurity  (12/11/2021)   Hunger Vital Sign    Worried About Running Out of Food in the Last Year: Never true    Ran Out of Food in the Last Year: Never true  Transportation Needs: No Transportation Needs (12/11/2021)   PRAPARE - Administrator, Civil Service (Medical): No    Lack of Transportation (Non-Medical): No  Physical Activity: Insufficiently Active (12/11/2021)   Exercise Vital Sign    Days of Exercise per Week: 5 days    Minutes of Exercise per Session: 20 min  Stress: No Stress Concern Present (12/11/2021)   [employer] of Occupational Health - Occupational Stress Questionnaire    Feeling of Stress : Not at all  Social Connections: Socially Isolated (12/11/2021)   Social Connection and Isolation Panel [NHANES]  Frequency of Communication with Friends and Family: More than three times a week    Frequency of Social Gatherings with Friends and Family: Twice a week    Attends Religious Services: Never    Database administrator or Organizations: No    Attends Engineer, structural: Never    Marital Status: Divorced    Tobacco Counseling Counseling given: Not Answered   Clinical Intake:  Pre-visit preparation completed: Yes  Pain : No/denies pain     Nutritional Risks: None Diabetes: No  How often do you need to have someone help you when you read instructions, pamphlets, or other written materials from your doctor or pharmacy?: 1 - Never  Diabetic?no  Interpreter Needed?: No  Information entered by :: Kennedy Bucker, LPN   Activities of Daily Living    12/11/2021   10:40 AM  In your present state of health, do you have any difficulty performing the following activities:  Hearing? 0  Vision? 0  Difficulty concentrating or making decisions? 0  Walking or climbing stairs? 1  Dressing or bathing? 0  Doing errands, shopping? 0  Preparing Food and eating ? N  Using the Toilet? N  In the past six months, have you accidently leaked urine? N  Do  you have problems with loss of bowel control? N  Managing your Medications? N  Managing your Finances? N  Housekeeping or managing your Housekeeping? N    Patient Care Team: Tower, Audrie Gallus, MD as PCP - General Mariah Milling, Tollie Pizza, MD as Consulting Physician (Cardiology)  Indicate any recent Medical Services you may have received from other than [facility] providers in the past year (date may be approximate).     Assessment:   This is a routine wellness examination for [place].  Hearing/Vision screen Hearing Screening - Comments:: No aids Vision Screening - Comments:: Wears readers- [facility]  Dietary issues and exercise activities discussed: Current Exercise Habits: Home exercise routine, Type of exercise: stretching, Time (Minutes): 20, Frequency (Times/Week): 7, Weekly Exercise (Minutes/Week): 140, Intensity: Mild   Goals Addressed             This Visit's Progress    DIET - EAT MORE FRUITS AND VEGETABLES         Depression Screen    12/11/2021   10:35 AM 12/05/2020   12:07 PM 11/29/2019   11:40 AM 11/23/2018   12:14 PM 10/02/2017   11:35 AM 09/23/2016    2:04 PM 09/13/2015   11:02 AM  PHQ 2/9 Scores  PHQ - 2 Score 0 0 0 0 0 0 0  PHQ- 9 Score 0 0  0 0      Fall Risk    12/11/2021   10:40 AM 12/05/2020   12:06 PM 11/29/2019   11:41 AM 01/31/2019    1:49 PM 11/23/2018   12:14 PM  Fall Risk   Falls in the past year? 0 0 0 0 0  Comment    Emmi Telephone Survey: data to providers prior to load   Number falls in past yr: 0 0 0    Injury with Fall? 0 0 0    Risk for fall due to : No Fall Risks Medication side effect     Follow up Falls evaluation completed Falls evaluation completed;Falls prevention discussed Falls evaluation completed      FALL RISK PREVENTION PERTAINING TO THE HOME:  Any stairs in or around the home? Yes  If so, are there any without handrails?  No  Home free of loose throw rugs in walkways, pet beds, electrical cords, etc? Yes  Adequate lighting in  your home to reduce risk of falls? Yes   ASSISTIVE DEVICES UTILIZED TO PREVENT FALLS:  Life alert? No  Use of a cane, walker or w/c? Yes  Grab bars in the bathroom? No  Shower chair or bench in shower? Yes  Elevated toilet seat or a handicapped toilet? No    Cognitive Function:declined to test 2023      12/05/2020   12:11 PM 11/23/2018   12:14 PM 10/02/2017   11:36 AM 09/23/2016    2:05 PM 09/13/2015   11:06 AM  MMSE - Mini Mental State Exam  Not completed: Refused      Orientation to time  5 5 5 5   Orientation to Place  5 5 5 5   Registration  3 3 3 3   Attention/ Calculation  0 0 0 5  Recall  3 3 3 3   Language- name 2 objects  0 0 0 0  Language- repeat  1 1 1 1   Language- follow 3 step command  0 2 3 3   Language- follow 3 step command-comments   unable to follow 1 step of 3 step command    Language- read & follow direction  0 0 0 1  Write a sentence  0 0 0 0  Copy design  0 0 0 0  Total score  17 19 20 26         Immunizations Immunization History  Administered Date(s) Administered   Fluad Quad(high Dose 65+) 03/01/2019   Influenza Split 04/01/2011, 03/17/2012   Influenza Whole 04/23/2006, 04/02/2007, 03/27/2008, 04/16/2009, 04/23/2010   Influenza, High Dose Seasonal PF 03/23/2020, 04/04/2021   Influenza,inj,Quad PF,6+ Mos 03/24/2013, 04/10/2014, 04/13/2015, 03/19/2016, 03/25/2017, 05/06/2018   PFIZER(Purple Top)SARS-COV-2 Vaccination 07/11/2019, 08/01/2019, 03/23/2020   Pfizer Covid-19 Vaccine Bivalent Booster 29yrs & up 04/04/2021   Pneumococcal Conjugate-13 09/11/2014   Pneumococcal Polysaccharide-23 04/16/2009   Td 04/23/2006   Zoster Recombinat (Shingrix) 03/22/2019, 06/10/2019    TDAP status: Due, Education has been provided regarding the importance of this vaccine. Advised may receive this vaccine at local pharmacy or Health Dept. Aware to provide a copy of the vaccination record if obtained from local pharmacy or Health Dept. Verbalized acceptance and  understanding.  Flu Vaccine status: Up to date  Pneumococcal vaccine status: Up to date  Covid-19 vaccine status: Completed vaccines  Qualifies for Shingles Vaccine? Yes   Zostavax completed No   Shingrix Completed?: Yes  Screening Tests Health Maintenance  Topic Date Due   COVID-19 Vaccine (5 - Pfizer series) 08/05/2021   TETANUS/TDAP  04/22/2026 (Originally 04/23/2016)   INFLUENZA VACCINE  01/28/2022   MAMMOGRAM  12/04/2022   Pneumonia Vaccine 7+ Years old  Completed   DEXA SCAN  Completed   Zoster Vaccines- Shingrix  Completed   HPV VACCINES  Aged Out    Health Maintenance  Health Maintenance Due  Topic Date Due   COVID-19 Vaccine (5 - Pfizer series) 08/05/2021    Colorectal cancer screening: No longer required.   Mammogram status: No longer required due to age.  Declined BDS  Lung Cancer Screening: (Low Dose CT Chest recommended if Age 54-80 years, 30 pack-year currently smoking OR have quit w/in 15years.) does not qualify.   Additional Screening:  Hepatitis C Screening: does not qualify; Completed no  Vision Screening: Recommended annual ophthalmology exams for early detection of glaucoma and other disorders of the eye. Is the  patient up to date with their annual eye exam?  Yes  Who is the provider or what is the name of the office in which the patient attends annual eye exams? [facility] If pt is not established with a provider, would they like to be referred to a provider to establish care? No .   Dental Screening: Recommended annual dental exams for proper oral hygiene  Community Resource Referral / Chronic Care Management: CRR required this visit?  No   CCM required this visit?  No      Plan:     I have personally reviewed and noted the following in the patient's chart:   Medical and social history Use of alcohol, tobacco or illicit drugs  Current medications and supplements including opioid prescriptions.  Functional ability and  status Nutritional status Physical activity Advanced directives List of other physicians Hospitalizations, surgeries, and ER visits in previous 12 months Vitals Screenings to include cognitive, depression, and falls Referrals and appointments  In addition, I have reviewed and discussed with patient certain preventive protocols, quality metrics, and best practice recommendations. A written personalized care plan for preventive services as well as general preventive health recommendations were provided to patient.     Hal HopeLorrie S Mico Spark, LPN   [id number]/29526/14/2023   Nurse Notes: none

## 2021-12-11 NOTE — Patient Instructions (Addendum)
Ms. Naff , Thank you for taking time to come for your Medicare Wellness Visit. I appreciate your ongoing commitment to your health goals. Please review the following plan we discussed and let me know if I can assist you in the future.   Screening recommendations/referrals: Colonoscopy: aged out Mammogram: aged out Bone Density: declined referral Recommended yearly ophthalmology/optometry visit for glaucoma screening and checkup Recommended yearly dental visit for hygiene and checkup  Vaccinations: Influenza vaccine: 04/04/21 Pneumococcal vaccine: 09/11/14 Tdap vaccine: 04/23/06, due if have an injury Shingles vaccine: Shingrix 03/22/19, 06/10/19   Covid-19:07/11/19, 08/01/19, 03/23/20, 04/04/21  Advanced directives: yes  Conditions/risks identified: none  Next appointment: Follow up in one year for your annual wellness visit - 12/16/22 @ 9:30 am by phone   Preventive Care 65 Years and Older, Female Preventive care refers to lifestyle choices and visits with your health care provider that can promote health and wellness. What does preventive care include? A yearly physical exam. This is also called an annual well check. Dental exams once or twice a year. Routine eye exams. Ask your health care provider how often you should have your eyes checked. Personal lifestyle choices, including: Daily care of your teeth and gums. Regular physical activity. Eating a healthy diet. Avoiding tobacco and drug use. Limiting alcohol use. Practicing safe sex. Taking low-dose aspirin every day. Taking vitamin and mineral supplements as recommended by your health care provider. What happens during an annual well check? The services and screenings done by your health care provider during your annual well check will depend on your age, overall health, lifestyle risk factors, and family history of disease. Counseling  Your health care provider may ask you questions about your: Alcohol use. Tobacco  use. Drug use. Emotional well-being. Home and relationship well-being. Sexual activity. Eating habits. History of falls. Memory and ability to understand (cognition). Work and work Astronomer. Reproductive health. Screening  You may have the following tests or measurements: Height, weight, and BMI. Blood pressure. Lipid and cholesterol levels. These may be checked every 5 years, or more frequently if you are over 63 years old. Skin check. Lung cancer screening. You may have this screening every year starting at age 84 if you have a 30-pack-year history of smoking and currently smoke or have quit within the past 15 years. Fecal occult blood test (FOBT) of the stool. You may have this test every year starting at age 84. Flexible sigmoidoscopy or colonoscopy. You may have a sigmoidoscopy every 5 years or a colonoscopy every 10 years starting at age 84. Hepatitis C blood test. Hepatitis B blood test. Sexually transmitted disease (STD) testing. Diabetes screening. This is done by checking your blood sugar (glucose) after you have not eaten for a while (fasting). You may have this done every 1-3 years. Bone density scan. This is done to screen for osteoporosis. You may have this done starting at age 84. Mammogram. This may be done every 1-2 years. Talk to your health care provider about how often you should have regular mammograms. Talk with your health care provider about your test results, treatment options, and if necessary, the need for more tests. Vaccines  Your health care provider may recommend certain vaccines, such as: Influenza vaccine. This is recommended every year. Tetanus, diphtheria, and acellular pertussis (Tdap, Td) vaccine. You may need a Td booster every 10 years. Zoster vaccine. You may need this after age 84. Pneumococcal 13-valent conjugate (PCV13) vaccine. One dose is recommended after age 84. Pneumococcal polysaccharide (PPSV23) vaccine.  One dose is recommended  after age 84. Talk to your health care provider about which screenings and vaccines you need and how often you need them. This information is not intended to replace advice given to you by your health care provider. Make sure you discuss any questions you have with your health care provider. Document Released: 07/13/2015 Document Revised: 03/05/2016 Document Reviewed: 04/17/2015 Elsevier Interactive Patient Education  2017 [place] Prevention in the Home Falls can cause injuries. They can happen to people of all ages. There are many things you can do to make your home safe and to help prevent falls. What can I do on the outside of my home? Regularly fix the edges of walkways and driveways and fix any cracks. Remove anything that might make you trip as you walk through a door, such as a raised step or threshold. Trim any bushes or trees on the path to your home. Use bright outdoor lighting. Clear any walking paths of anything that might make someone trip, such as rocks or tools. Regularly check to see if handrails are loose or broken. Make sure that both sides of any steps have handrails. Any raised decks and porches should have guardrails on the edges. Have any leaves, snow, or ice cleared regularly. Use sand or salt on walking paths during winter. Clean up any spills in your garage right away. This includes oil or grease spills. What can I do in the bathroom? Use night lights. Install grab bars by the toilet and in the tub and shower. Do not use towel bars as grab bars. Use non-skid mats or decals in the tub or shower. If you need to sit down in the shower, use a plastic, non-slip stool. Keep the floor dry. Clean up any water that spills on the floor as soon as it happens. Remove soap buildup in the tub or shower regularly. Attach bath mats securely with double-sided non-slip rug tape. Do not have throw rugs and other things on the floor that can make you trip. What can I do  in the bedroom? Use night lights. Make sure that you have a light by your bed that is easy to reach. Do not use any sheets or blankets that are too big for your bed. They should not hang down onto the floor. Have a firm chair that has side arms. You can use this for support while you get dressed. Do not have throw rugs and other things on the floor that can make you trip. What can I do in the kitchen? Clean up any spills right away. Avoid walking on wet floors. Keep items that you use a lot in easy-to-reach places. If you need to reach something above you, use a strong step stool that has a grab bar. Keep electrical cords out of the way. Do not use floor polish or wax that makes floors slippery. If you must use wax, use non-skid floor wax. Do not have throw rugs and other things on the floor that can make you trip. What can I do with my stairs? Do not leave any items on the stairs. Make sure that there are handrails on both sides of the stairs and use them. Fix handrails that are broken or loose. Make sure that handrails are as long as the stairways. Check any carpeting to make sure that it is firmly attached to the stairs. Fix any carpet that is loose or worn. Avoid having throw rugs at the top or bottom of  the stairs. If you do have throw rugs, attach them to the floor with carpet tape. Make sure that you have a light switch at the top of the stairs and the bottom of the stairs. If you do not have them, ask someone to add them for you. What else can I do to help prevent falls? Wear shoes that: Do not have high heels. Have rubber bottoms. Are comfortable and fit you well. Are closed at the toe. Do not wear sandals. If you use a stepladder: Make sure that it is fully opened. Do not climb a closed stepladder. Make sure that both sides of the stepladder are locked into place. Ask someone to hold it for you, if possible. Clearly mark and make sure that you can see: Any grab bars or  handrails. First and last steps. Where the edge of each step is. Use tools that help you move around (mobility aids) if they are needed. These include: Canes. Walkers. Scooters. Crutches. Turn on the lights when you go into a dark area. Replace any light bulbs as soon as they burn out. Set up your furniture so you have a clear path. Avoid moving your furniture around. If any of your floors are uneven, fix them. If there are any pets around you, be aware of where they are. Review your medicines with your doctor. Some medicines can make you feel dizzy. This can increase your chance of falling. Ask your doctor what other things that you can do to help prevent falls. This information is not intended to replace advice given to you by your health care provider. Make sure you discuss any questions you have with your health care provider. Document Released: 04/12/2009 Document Revised: 11/22/2015 Document Reviewed: 07/21/2014 Elsevier Interactive Patient Education  2017 [employer].

## 2021-12-11 NOTE — Telephone Encounter (Signed)
-----   Message from Ellamae Sia sent at 11/27/2021  3:31 PM EDT ----- Regarding: Lab orders for Thursday, 6.15.23 Patient is scheduled for CPX labs, please order future labs, Thanks , Karna Christmas

## 2021-12-12 ENCOUNTER — Telehealth: Payer: Self-pay | Admitting: Radiology

## 2021-12-12 ENCOUNTER — Other Ambulatory Visit (INDEPENDENT_AMBULATORY_CARE_PROVIDER_SITE_OTHER): Payer: Medicare Other

## 2021-12-12 DIAGNOSIS — I10 Essential (primary) hypertension: Secondary | ICD-10-CM | POA: Diagnosis not present

## 2021-12-12 DIAGNOSIS — R7303 Prediabetes: Secondary | ICD-10-CM

## 2021-12-12 DIAGNOSIS — E039 Hypothyroidism, unspecified: Secondary | ICD-10-CM

## 2021-12-12 DIAGNOSIS — E782 Mixed hyperlipidemia: Secondary | ICD-10-CM

## 2021-12-12 DIAGNOSIS — M810 Age-related osteoporosis without current pathological fracture: Secondary | ICD-10-CM

## 2021-12-12 LAB — COMPREHENSIVE METABOLIC PANEL
ALT: 9 U/L (ref 0–35)
AST: 13 U/L (ref 0–37)
Albumin: 4.2 g/dL (ref 3.5–5.2)
Alkaline Phosphatase: 62 U/L (ref 39–117)
BUN: 22 mg/dL (ref 6–23)
CO2: 33 mEq/L — ABNORMAL HIGH (ref 19–32)
Calcium: 9.7 mg/dL (ref 8.4–10.5)
Chloride: 100 mEq/L (ref 96–112)
Creatinine, Ser: 1.06 mg/dL (ref 0.40–1.20)
GFR: 48.52 mL/min — ABNORMAL LOW (ref >60.00)
Glucose, Bld: 102 mg/dL — ABNORMAL HIGH (ref 70–99)
Potassium: 4.4 mEq/L (ref 3.5–5.1)
Sodium: 142 mEq/L (ref 135–145)
Total Bilirubin: 1.1 mg/dL (ref 0.2–1.2)
Total Protein: 7.5 g/dL (ref 6.0–8.3)

## 2021-12-12 LAB — CBC WITH DIFFERENTIAL/PLATELET
Basophils Absolute: 0 10*3/uL (ref 0.0–0.1)
Basophils Relative: 0.4 % (ref 0.0–3.0)
Eosinophils Absolute: 0.1 10*3/uL (ref 0.0–0.7)
Eosinophils Relative: 1.7 % (ref 0.0–5.0)
HCT: 41.7 % (ref 36.0–46.0)
Hemoglobin: 14 g/dL (ref 12.0–15.0)
Lymphocytes Relative: 22.2 % (ref 12.0–46.0)
Lymphs Abs: 1.3 10*3/uL (ref 0.7–4.0)
MCHC: 33.4 g/dL (ref 30.0–36.0)
MCV: 93.1 fl (ref 78.0–100.0)
Monocytes Absolute: 0.4 10*3/uL (ref 0.1–1.0)
Monocytes Relative: 6.2 % (ref 3.0–12.0)
Neutro Abs: 4.1 10*3/uL (ref 1.4–7.7)
Neutrophils Relative %: 69.5 % (ref 43.0–77.0)
Platelets: 153 10*3/uL (ref 150.0–400.0)
RBC: 4.48 Mil/uL (ref 3.87–5.11)
RDW: 13.6 % (ref 11.5–15.5)
WBC: 5.9 10*3/uL (ref 4.0–10.5)

## 2021-12-12 LAB — TSH: TSH: 5.56 u[IU]/mL — ABNORMAL HIGH (ref 0.35–5.50)

## 2021-12-12 LAB — VITAMIN D 25 HYDROXY (VIT D DEFICIENCY, FRACTURES): VITD: 117.06 ng/mL (ref 30.00–100.00)

## 2021-12-12 LAB — LIPID PANEL
Cholesterol: 147 mg/dL (ref 0–200)
HDL: 53.5 mg/dL (ref >39.00)
LDL Cholesterol: 78 mg/dL (ref 0–99)
NonHDL: 93.01
Total CHOL/HDL Ratio: 3
Triglycerides: 76 mg/dL (ref 0.0–149.0)
VLDL: 15.2 mg/dL (ref 0.0–40.0)

## 2021-12-12 LAB — HEMOGLOBIN A1C: Hgb A1c MFr Bld: 5.6 % (ref 4.6–6.5)

## 2021-12-12 NOTE — Telephone Encounter (Signed)
Please inst her to hold vit D, we will disc at her appt

## 2021-12-12 NOTE — Telephone Encounter (Signed)
Called patient she will hold vit d. Aware tower will review at visit

## 2021-12-12 NOTE — Telephone Encounter (Signed)
[facility] called a critical result, Vit D - 117.06, results given to Dr Para March on site and sent to Dr Milinda Antis

## 2021-12-13 ENCOUNTER — Telehealth: Payer: Self-pay

## 2021-12-13 NOTE — Telephone Encounter (Signed)
Patient stated that she has already talked with someone about her lab results and appreciated the call back.

## 2021-12-13 NOTE — Telephone Encounter (Signed)
Patient states she missed a call from someone.

## 2021-12-13 NOTE — Telephone Encounter (Signed)
Per PCP.  Thanks.  

## 2021-12-13 NOTE — Telephone Encounter (Deleted)
Per PCP.  Thanks.  

## 2021-12-18 ENCOUNTER — Ambulatory Visit (INDEPENDENT_AMBULATORY_CARE_PROVIDER_SITE_OTHER): Payer: Medicare Other | Admitting: Family Medicine

## 2021-12-18 ENCOUNTER — Encounter: Payer: Self-pay | Admitting: Family Medicine

## 2021-12-18 VITALS — BP 148/86 | HR 95 | Ht 63.0 in | Wt 171.2 lb

## 2021-12-18 DIAGNOSIS — I4821 Permanent atrial fibrillation: Secondary | ICD-10-CM

## 2021-12-18 DIAGNOSIS — M810 Age-related osteoporosis without current pathological fracture: Secondary | ICD-10-CM

## 2021-12-18 DIAGNOSIS — E782 Mixed hyperlipidemia: Secondary | ICD-10-CM

## 2021-12-18 DIAGNOSIS — R7303 Prediabetes: Secondary | ICD-10-CM

## 2021-12-18 DIAGNOSIS — E6609 Other obesity due to excess calories: Secondary | ICD-10-CM

## 2021-12-18 DIAGNOSIS — I5032 Chronic diastolic (congestive) heart failure: Secondary | ICD-10-CM

## 2021-12-18 DIAGNOSIS — E039 Hypothyroidism, unspecified: Secondary | ICD-10-CM

## 2021-12-18 DIAGNOSIS — I10 Essential (primary) hypertension: Secondary | ICD-10-CM | POA: Diagnosis not present

## 2021-12-18 DIAGNOSIS — H9193 Unspecified hearing loss, bilateral: Secondary | ICD-10-CM

## 2021-12-18 DIAGNOSIS — Z6830 Body mass index (BMI) 30.0-30.9, adult: Secondary | ICD-10-CM

## 2021-12-18 MED ORDER — ALENDRONATE SODIUM 70 MG PO TABS: ORAL_TABLET | ORAL | 3 refills | Status: DC

## 2021-12-18 NOTE — Assessment & Plan Note (Signed)
Continues rate control eliquis  Under cardiology care

## 2021-12-18 NOTE — Assessment & Plan Note (Signed)
bp in fair control at this time  BP Readings from Last 1 Encounters:  12/18/21 (!) 148/86   No changes needed Most recent labs reviewed  Disc lifstyle change with low sodium diet and exercise  Under card care Plan to continue Cardizem 120 mg daily  Cardura 4 mg bid Lasix 20 mg daily Metoprolol xl 25 mg daily Losartan 100 mg daily

## 2021-12-18 NOTE — Assessment & Plan Note (Signed)
Lab Results  Component Value Date   HGBA1C 5.6 12/12/2021   Stable disc imp of low glycemic diet and wt loss to prevent DM2

## 2021-12-18 NOTE — Assessment & Plan Note (Signed)
Doing better with inc of lasix to 20 mg daily recently  [facility] cardiology  Taking arb

## 2021-12-18 NOTE — Assessment & Plan Note (Signed)
Declines hearing aides  Does not think she can pay for them

## 2021-12-18 NOTE — Assessment & Plan Note (Signed)
Lab Results  Component Value Date   TSH 5.56 (H) 12/12/2021   No clinical changes Does take biotin on/off Pt wishes to not start levothyroxine Also a fib Will continue to monitor

## 2021-12-18 NOTE — Patient Instructions (Addendum)
Stop your vitamin D3 (your level is high)   Continue the calcium with D in it   You will be due for a bone density test in December Call us then and we will order it   We will watch the thyroid function (especially if you take biotin)  Eat a healthy balanced diet  Try to get most of your carbohydrates from produce (with the exception of white potatoes)  Eat less bread/pasta/rice/snack foods/cereals/sweets and other items from the middle of the grocery store (processed carbs)

## 2021-12-18 NOTE — Progress Notes (Signed)
Subjective:    Patient ID: Linda CousinsLinda R Johnson, female    DOB: 05/25/1936, 86 y.o.   MRN: [id number]  HPI Pt presents for annual f/u of chronic medical problems  Wt Readings from Last 3 Encounters:  12/18/21 171 lb 3.2 oz (77.7 kg)  12/11/21 170 lb (77.1 kg)  09/23/21 170 lb 8 oz (77.3 kg)   30.33 kg/m  Feeling better lately  More energy   Does not do a lot /mobility impaired with walker (gets some exercise with the walker)  Takes care of herself  Gets help at home and groceries delivered  She still drives some   Diet is fair  A lot more veggies and salads (salads twice daily)  Yogurt for protein  Likes avacados    Reviewed amw from 6/14  Immunization History  Administered Date(s) Administered   Fluad Quad(high Dose 65+) 03/01/2019   Influenza Split 04/01/2011, 03/17/2012   Influenza Whole 04/23/2006, 04/02/2007, 03/27/2008, 04/16/2009, 04/23/2010   Influenza, High Dose Seasonal PF 03/23/2020, 04/04/2021   Influenza,inj,Quad PF,6+ Mos 03/24/2013, 04/10/2014, 04/13/2015, 03/19/2016, 03/25/2017, 05/06/2018   PFIZER(Purple Top)SARS-COV-2 Vaccination 07/11/2019, 08/01/2019, 03/23/2020   Pfizer Covid-19 Vaccine Bivalent Booster 7061yrs & up 04/04/2021   Pneumococcal Conjugate-13 09/11/2014   Pneumococcal Polysaccharide-23 04/16/2009   Td 04/23/2006   Zoster Recombinat (Shingrix) 03/22/2019, 06/10/2019   Tetanus postponed for ins   Mammogram utd  Self breast exam: no lumps   Declines colon cancer screening of any kind   Dexa 04/2020 OP-worse Discussed another 5 y course of alendronate -started it in the fall of 2021  Falls/fractures-none (doing well)  Vit D level was high at 117 Was taking extra 2000 iu D3 daily with her ca plus D  HTN bp is stable today  No cp or palpitations or headaches or edema  No side effects to medicines  BP Readings from Last 3 Encounters:  12/18/21 (!) 148/86  09/23/21 140/80  04/23/21 140/80    Cardizem 120 mg daily  Cardura 4 mg  bid Lasix 20 mg daily Metoprolol xl 25 mg daily Losartan 100 mg daily   Sees cardiology for this and a fib and CHF diastolic  Most recently inc lasix to daily and she is a lot less sob Feels a lot better/able to do more   Eliquis is over 100$- barely able to afford    Lab Results  Component Value Date   CREATININE 1.06 12/12/2021   BUN 22 12/12/2021   NA 142 12/12/2021   K 4.4 12/12/2021   CL 100 12/12/2021   CO2 33 (H) 12/12/2021  She makes effort to drink lots of water  A little coffee    Hypothyroidism  Pt has no clinical changes She takes biotin on and  No change in energy level/ hair or skin/ edema and no tremor Lab Results  Component Value Date   TSH 5.56 (H) 12/12/2021    Not taking thyroid supplementation  She would rather not take levothyroxine now (a fib)    Hyperlipidemia Lab Results  Component Value Date   CHOL 147 12/12/2021   CHOL 133 12/05/2020   CHOL 143 11/24/2019   Lab Results  Component Value Date   HDL 53.50 12/12/2021   HDL 37.70 (L) 12/05/2020   HDL 47.20 11/24/2019   Lab Results  Component Value Date   LDLCALC 78 12/12/2021   LDLCALC 73 12/05/2020   LDLCALC 77 11/24/2019   Lab Results  Component Value Date   TRIG 76.0 12/12/2021   TRIG 110.0  12/05/2020   TRIG 91.0 11/24/2019   Lab Results  Component Value Date   CHOLHDL 3 12/12/2021   CHOLHDL 4 12/05/2020   CHOLHDL 3 11/24/2019   Lab Results  Component Value Date   LDLDIRECT 132.4 05/06/2010   LDLDIRECT 107.9 04/02/2007   Diet controlled  Prediabetes Lab Results  Component Value Date   HGBA1C 5.6 12/12/2021   Stable- improved   Patient Active Problem List   Diagnosis Date Noted   History of COVID-19 11/23/2020   Medicare annual wellness visit, subsequent 11/29/2019   Chronic diastolic CHF (congestive heart failure) (HCC) 08/30/2018   Pedal edema 08/23/2018   PVC (premature ventricular contraction) 01/18/2018   Poor balance 10/05/2017   Post herpetic  neuralgia 09/23/2017   Encounter for therapeutic drug monitoring 09/24/2015   Encounter for anticoagulation discussion and counseling 09/20/2015   Irregular heart rate 09/17/2015   Permanent atrial fibrillation (HCC) 09/17/2015   Hearing loss 09/17/2015   Hypothyroidism 12/12/2014   Prediabetes 09/11/2014   Encounter for Medicare annual wellness exam 09/11/2014   Estrogen deficiency 09/11/2014   Routine general medical examination at a health care facility 09/09/2013   Low back pain 03/21/2013   Other screening mammogram 04/19/2012   Post-menopausal 04/19/2012   Obesity 08/18/2011   Hyperlipidemia 06/12/2010   Osteoporosis 06/12/2010   ALLERGIC RHINITIS CAUSE UNSPECIFIED 04/12/2008   HX, PERSONAL, COLONIC POLYPS 04/02/2007   STRABISMUS 03/02/2007   Essential hypertension 03/02/2007   Past Medical History:  Diagnosis Date   Arthritis    hips, knees   Arthritis    Atrial fibrillation (HCC)    chronic   Back pain    Balance problem    Cardiomegaly    Cataract 2020   Right eye December 06, 2018, Left eye date pending   CHF (congestive heart failure) (HCC) 07/2018   chronic   Hypertension    Osteopenia    Pleural effusion 08/25/2018   [facility]   Shingles 08/2017   H/O   SOB (shortness of breath)    Past Surgical History:  Procedure Laterality Date   BREAST BIOPSY Left 2011   2 areas, cysts per pt   CATARACT EXTRACTION W/PHACO Right 12/06/2018   Procedure: CATARACT EXTRACTION PHACO AND INTRAOCULAR LENS PLACEMENT (IOC) RIGHT;  Surgeon: Nevada Crane, MD;  Location: [facility];  Service: Ophthalmology;  Laterality: Right;   CATARACT EXTRACTION W/PHACO Left 01/17/2019   Procedure: CATARACT EXTRACTION PHACO AND INTRAOCULAR LENS PLACEMENT (IOC) LEFT;  Surgeon: Nevada Crane, MD;  Location: [facility];  Service: Ophthalmology;  Laterality: Left;   Social History   Tobacco Use   Smoking status: Never   Smokeless tobacco: Never  Vaping Use   Vaping Use:  Never used  Substance Use Topics   Alcohol use: No    Alcohol/week: 0.0 standard drinks of alcohol   Drug use: No   Family History  Problem Relation Age of Onset   Leukemia Sister    Breast cancer Sister 41   Breast cancer Maternal Aunt 50   Allergies  Allergen Reactions   Ace Inhibitors Cough        Hydrocodone Bit-Homatrop Mbr Nausea Only   Tramadol Hcl Nausea Only        Current Outpatient Medications on File Prior to Visit  Medication Sig Dispense Refill   apixaban (ELIQUIS) 5 MG TABS tablet Take 1 tablet (5 mg total) by mouth 2 (two) times daily. 60 tablet 11   calcium-vitamin D (OSCAL WITH D) 500-200 MG-UNIT  per tablet Take 1 tablet by mouth 2 (two) times daily.     diltiazem (CARDIZEM CD) 120 MG 24 hr capsule TAKE 1 CAPSULE BY MOUTH EVERY DAY 90 capsule 3   doxazosin (CARDURA) 8 MG tablet TAKE 1/2 TABLETS (4 MG TOTAL) BY MOUTH 2 (TWO) TIMES DAILY. 90 tablet 3   furosemide (LASIX) 20 MG tablet Take 20 mg by mouth daily.     latanoprost (XALATAN) 0.005 % ophthalmic solution INSTILL ONE DROP IN BOTH EYES AT BEDTIME.  3   losartan (COZAAR) 100 MG tablet Take 1 tablet (100 mg total) by mouth daily. 90 tablet 3   metoprolol succinate (TOPROL-XL) 25 MG 24 hr tablet Take 1 tablet (25 mg total) by mouth daily. 90 tablet 3   Multiple Vitamins-Minerals (HAIR SKIN AND NAILS FORMULA) TABS Take 1 tablet by mouth once a week.     potassium chloride (KLOR-CON) 10 MEQ tablet Take 1 tablet (10 mEq total) by mouth daily. 90 tablet 3   No current facility-administered medications on file prior to visit.     Review of Systems  Constitutional:  Negative for activity change, appetite change, fatigue, fever and unexpected weight change.  HENT:  Negative for congestion, ear pain, rhinorrhea, sinus pressure and sore throat.   Eyes:  Negative for pain, redness and visual disturbance.  Respiratory:  Negative for cough, shortness of breath and wheezing.   Cardiovascular:  Negative for chest pain  and palpitations.  Gastrointestinal:  Negative for abdominal pain, blood in stool, constipation and diarrhea.  Endocrine: Negative for polydipsia and polyuria.  Genitourinary:  Negative for dysuria, frequency and urgency.  Musculoskeletal:  Negative for arthralgias, back pain and myalgias.  Skin:  Negative for pallor and rash.  Allergic/Immunologic: Negative for environmental allergies.  Neurological:  Negative for dizziness, syncope and headaches.  Hematological:  Negative for adenopathy. Does not bruise/bleed easily.  Psychiatric/Behavioral:  Negative for decreased concentration and dysphoric mood. The patient is not nervous/anxious.        Objective:   Physical Exam Constitutional:      General: She is not in acute distress.    Appearance: Normal appearance. She is well-developed. She is obese. She is not ill-appearing or diaphoretic.  HENT:     Head: Normocephalic and atraumatic.     Right Ear: Tympanic membrane, ear canal and external ear normal.     Left Ear: Tympanic membrane, ear canal and external ear normal.     Nose: Nose normal. No congestion.     Mouth/Throat:     Mouth: Mucous membranes are moist.     Pharynx: Oropharynx is clear. No posterior oropharyngeal erythema.  Eyes:     General: No scleral icterus.    Extraocular Movements: Extraocular movements intact.     Conjunctiva/sclera: Conjunctivae normal.     Pupils: Pupils are equal, round, and reactive to light.     Comments: Baseline strabismus  Neck:     Thyroid: No thyromegaly.     Vascular: No carotid bruit or JVD.  Cardiovascular:     Rate and Rhythm: Normal rate and regular rhythm.     Pulses: Normal pulses.     Heart sounds: Normal heart sounds.     No gallop.  Pulmonary:     Effort: Pulmonary effort is normal. No respiratory distress.     Breath sounds: Normal breath sounds. No wheezing or rales.     Comments: Good air exch Chest:     Chest wall: No tenderness.  Abdominal:  General: Bowel  sounds are normal. There is no distension or abdominal bruit.     Palpations: Abdomen is soft. There is no mass.     Tenderness: There is no abdominal tenderness.     Hernia: No hernia is present.  Genitourinary:    Comments: Breast exam: No mass, nodules, thickening, tenderness, bulging, retraction, inflamation, nipple discharge or skin changes noted.  No axillary or clavicular LA.     Musculoskeletal:        General: No tenderness. Normal range of motion.     Cervical back: Normal range of motion and neck supple. No rigidity. No muscular tenderness.     Right lower leg: No edema.     Left lower leg: No edema.     Comments: No kyphosis   Lymphadenopathy:     Cervical: No cervical adenopathy.  Skin:    General: Skin is warm and dry.     Coloration: Skin is not pale.     Findings: No erythema or rash.     Comments: Solar lentigines diffusely    Neurological:     Mental Status: She is alert. Mental status is at baseline.     Cranial Nerves: No cranial nerve deficit.     Motor: No abnormal muscle tone.     Coordination: Coordination normal.     Gait: Gait normal.     Deep Tendon Reflexes: Reflexes are normal and symmetric. Reflexes normal.  Psychiatric:        Mood and Affect: Mood normal.        Cognition and Memory: Cognition and memory normal.           Assessment & Plan:   Problem List Items Addressed This Visit       Cardiovascular and Mediastinum   Chronic diastolic CHF (congestive heart failure) (HCC)    Doing better with inc of lasix to 20 mg daily recently  Sees cardiology  Taking arb      Essential hypertension - Primary    bp in fair control at this time  BP Readings from Last 1 Encounters:  12/18/21 (!) 148/86  No changes needed Most recent labs reviewed  Disc lifstyle change with low sodium diet and exercise  Under card care Plan to continue Cardizem 120 mg daily  Cardura 4 mg bid Lasix 20 mg daily Metoprolol xl 25 mg daily Losartan 100 mg  daily       Permanent atrial fibrillation (HCC)    Continues rate control eliquis  Under cardiology care        Endocrine   Hypothyroidism    Lab Results  Component Value Date   TSH 5.56 (H) 12/12/2021  No clinical changes Does take biotin on/off Pt wishes to not start levothyroxine Also a fib Will continue to monitor         Nervous and Auditory   Hearing loss    Declines hearing aides  Does not think she can pay for them        Musculoskeletal and Integument   Osteoporosis    Tolerating fosamax for 2nd course  Since fall 2021 No falls/fx Holding extra D3 due to high D level Will continue ca plus D bid       Relevant Medications   alendronate (FOSAMAX) 70 MG tablet     Other   Hyperlipidemia    Disc goals for lipids and reasons to control them Rev last labs with pt Rev low sat fat diet in detail  LDL  stable in 70s without med  Under care of cardiology      Obesity    Discussed how this problem influences overall health and the risks it imposes  Reviewed plan for weight loss with lower calorie diet (via better food choices and also portion control or program like weight watchers) and exercise building up to or more than 30 minutes 5 days per week including some aerobic activity    Is walking with walker for exercise       Prediabetes    Lab Results  Component Value Date   HGBA1C 5.6 12/12/2021  Stable disc imp of low glycemic diet and wt loss to prevent DM2

## 2021-12-18 NOTE — Assessment & Plan Note (Signed)
Tolerating fosamax for 2nd course  Since fall 2021 No falls/fx Holding extra D3 due to high D level Will continue ca plus D bid

## 2021-12-18 NOTE — Assessment & Plan Note (Signed)
Disc goals for lipids and reasons to control them Rev last labs with pt Rev low sat fat diet in detail  LDL stable in 70s without med  Under care of cardiology

## 2021-12-18 NOTE — Assessment & Plan Note (Signed)
Discussed how this problem influences overall health and the risks it imposes  Reviewed plan for weight loss with lower calorie diet (via better food choices and also portion control or program like weight watchers) and exercise building up to or more than 30 minutes 5 days per week including some aerobic activity    Is walking with walker for exercise

## 2022-01-02 ENCOUNTER — Ambulatory Visit: Payer: Medicare Other | Admitting: Family Medicine

## 2022-02-24 ENCOUNTER — Ambulatory Visit (INDEPENDENT_AMBULATORY_CARE_PROVIDER_SITE_OTHER): Payer: Medicare Other | Admitting: Family Medicine

## 2022-02-24 VITALS — BP 120/72 | HR 86 | Temp 96.2°F | Wt 171.4 lb

## 2022-02-24 DIAGNOSIS — B029 Zoster without complications: Secondary | ICD-10-CM | POA: Diagnosis not present

## 2022-02-24 MED ORDER — VALACYCLOVIR HCL 1 G PO TABS: 1000.0000 mg | ORAL_TABLET | Freq: Three times a day (TID) | ORAL | 0 refills | Status: AC

## 2022-02-24 NOTE — Assessment & Plan Note (Signed)
Pt notes hx of shingles on the back. Chart review - had post herpetic neuralgia treated with gabapentin. She has been taking gabapentin for this rash - continue prn.  Discussed she is outside of the window where valacyclovir may be effective, but given age prescription provided in case there are some improvement.  As rash is at the midline discussed importance of calling if it extends beyond the midline as this would change recommendation.  Follow-up with PCP if pain is not improving.

## 2022-02-24 NOTE — Patient Instructions (Signed)
Start Valacyclovir - take three times daily  Ok to take gabapentin for pain  Can try over the counter topical Lidocaine patches if needed for pain   Call immediately if you get the rash on the RIGHT side of your body  Return if pain or rash not improving

## 2022-02-24 NOTE — Progress Notes (Signed)
   Subjective:     Linda Johnson is a 86 y.o. female presenting for Rash (Under L breast, with back pain )     Rash     #Rash - started 02/20/2022 - started with back pain - also had a small red spot looked like an insect bite - then it started spreading - itching - hx of shingles thinks this is what she has - pain - taking her home gabapentin 1 pill twice daily  Review of Systems  Skin:  Positive for rash.     Social History   Tobacco Use  Smoking Status Never  Smokeless Tobacco Never        Objective:    BP Readings from Last 3 Encounters:  02/24/22 120/72  12/18/21 (!) 148/86  09/23/21 140/80   Wt Readings from Last 3 Encounters:  02/24/22 171 lb 6 oz (77.7 kg)  12/18/21 171 lb 3.2 oz (77.7 kg)  12/11/21 170 lb (77.1 kg)    BP 120/72   Pulse 86   Temp (!) 96.2 F (35.7 C) (Temporal)   Wt 171 lb 6 oz (77.7 kg)   LMP  (LMP Unknown)   SpO2 96%   BMI 30.36 kg/m    Physical Exam Constitutional:      General: She is not in acute distress.    Appearance: She is well-developed. She is not diaphoretic.  HENT:     Right Ear: External ear normal.     Left Ear: External ear normal.     Nose: Nose normal.  Eyes:     Conjunctiva/sclera: Conjunctivae normal.  Cardiovascular:     Rate and Rhythm: Normal rate and regular rhythm.     Heart sounds: No murmur heard. Pulmonary:     Effort: Pulmonary effort is normal. No respiratory distress.     Breath sounds: Normal breath sounds. No wheezing.  Musculoskeletal:     Cervical back: Neck supple.  Skin:    General: Skin is warm and dry.     Capillary Refill: Capillary refill takes less than 2 seconds.     Comments: Vesicular rash with an erythematous base under the left breast stopping just at the midline and tracing around the side.   Neurological:     Mental Status: She is alert. Mental status is at baseline.  Psychiatric:        Mood and Affect: Mood normal.        Behavior: Behavior normal.            Assessment & Plan:   Problem List Items Addressed This Visit       Other   Herpes zoster without complication - Primary    Pt notes hx of shingles on the back. Chart review - had post herpetic neuralgia treated with gabapentin. She has been taking gabapentin for this rash - continue prn.  Discussed she is outside of the window where valacyclovir may be effective, but given age prescription provided in case there are some improvement.  As rash is at the midline discussed importance of calling if it extends beyond the midline as this would change recommendation.  Follow-up with PCP if pain is not improving.      Relevant Medications   valACYclovir (VALTREX) 1000 MG tablet     Return if symptoms worsen or fail to improve.  Lynnda Child, MD

## 2022-03-25 NOTE — Progress Notes (Unsigned)
Cardiology Office Note  Date:  03/26/2022   ID:  Linda Johnson, DOB [birth year], MRN [id number]  PCP:  Abner Greenspan, MD   Chief Complaint  Patient presents with   6 month follow up     Patient is getting over shingles, has shortness of breath. Medications reviewed by the patient verbally.     HPI:  Linda Johnson is a 86 y.o. woman with a hx of  Permanent atrial fibrillation,since 2017 previously declined cardioversion On anticoagulation/eliquis hypertension who presents for routine follow-up of her  PVCs, atrial fibrillation  LOV 3/23 Getting over shingles left breast area Took antivirus medication, valACYclovir (VALTREX) 1000 MG tablet Still sore in that area  No family presenting with her on today's visit Chronic SOB in the Am, when getting ready, brushing her hair, getting dressed Sedentary at baseline Feels better after taking her Lasix in the morning Denies any significant leg swelling or abdominal distention Walks with walker No recent falls No regular exercise program,   EKG personally reviewed by myself on todays visit Atrial fibrillation with ventricular rate 90 bpm nonspecific ST abnormality No significant change  Other past medical history reviewed Covid in may 2022  Prior history of supratherapeutic INR of 7 on warfarin Changed to Eliquis  Previously felt she had nausea from the low-dose amiodarone  Atrial fibrillation picked up by primary care on routine EKG early 2017    PMH:   has a past medical history of Arthritis, Arthritis, Atrial fibrillation ([place]), Back pain, Balance problem, Cardiomegaly, Cataract (2020), CHF (congestive heart failure) ([place]) (07/2018), Hypertension, Osteopenia, Pleural effusion (08/25/2018), Shingles (08/2017), and SOB (shortness of breath).  PSH:    Past Surgical History:  Procedure Laterality Date   BREAST BIOPSY Left 2011   2 areas, cysts per pt   CATARACT EXTRACTION W/PHACO Right 12/06/2018   Procedure: CATARACT EXTRACTION  PHACO AND INTRAOCULAR LENS PLACEMENT ([place]) RIGHT;  Surgeon: Eulogio Bear, MD;  Location: [facility];  Service: Ophthalmology;  Laterality: Right;   CATARACT EXTRACTION W/PHACO Left 01/17/2019   Procedure: CATARACT EXTRACTION PHACO AND INTRAOCULAR LENS PLACEMENT ([place]) LEFT;  Surgeon: Eulogio Bear, MD;  Location: [facility];  Service: Ophthalmology;  Laterality: Left;    Current Outpatient Medications  Medication Sig Dispense Refill   alendronate (FOSAMAX) 70 MG tablet TAKE 1 TABLET (70 MG TOTAL) EVERY 7 DAYS. TAKE WITH A FULL GLASS OF WATER ON AN EMPTY STOMACH. 12 tablet 3   apixaban (ELIQUIS) 5 MG TABS tablet Take 1 tablet (5 mg total) by mouth 2 (two) times daily. 60 tablet 11   diltiazem (CARDIZEM CD) 120 MG 24 hr capsule TAKE 1 CAPSULE BY MOUTH EVERY DAY 90 capsule 3   doxazosin (CARDURA) 8 MG tablet TAKE 1/2 TABLETS (4 MG TOTAL) BY MOUTH 2 (TWO) TIMES DAILY. 90 tablet 3   furosemide (LASIX) 20 MG tablet Take 20 mg by mouth daily.     latanoprost (XALATAN) 0.005 % ophthalmic solution INSTILL ONE DROP IN BOTH EYES AT BEDTIME.  3   losartan (COZAAR) 100 MG tablet Take 1 tablet (100 mg total) by mouth daily. 90 tablet 3   metoprolol succinate (TOPROL-XL) 25 MG 24 hr tablet Take 1 tablet (25 mg total) by mouth daily. 90 tablet 3   Multiple Vitamins-Minerals (HAIR SKIN AND NAILS FORMULA) TABS Take 1 tablet by mouth once a week.     potassium chloride (KLOR-CON) 10 MEQ tablet Take 1 tablet (10 mEq total) by mouth daily. 90 tablet 3  No current facility-administered medications for this visit.    Allergies:   Ace inhibitors, Hydrocodone bit-homatrop mbr, and Tramadol hcl   Social History:  The patient  reports that she has never smoked. She has never used smokeless tobacco. She reports that she does not drink alcohol and does not use drugs.   Family History:   family history includes Breast cancer (age of onset: 26) in her maternal aunt; Breast cancer (age of onset:  54) in her sister; Leukemia in her sister.    Review of Systems: Review of Systems  HENT: Negative.    Eyes: Negative.   Respiratory: Negative.    Cardiovascular: Negative.   Gastrointestinal: Negative.   Genitourinary: Negative.   Musculoskeletal: Negative.   Neurological: Negative.   Psychiatric/Behavioral: Negative.    All other systems reviewed and are negative.   PHYSICAL EXAM: VS:  BP (!) 140/70 (BP Location: Left Arm, Patient Position: Sitting, Cuff Size: Normal)   Pulse 90   Ht 5\' 2"  (1.575 m)   Wt 168 lb (76.2 kg)   LMP  (LMP Unknown)   SpO2 96%   BMI 30.73 kg/m  , BMI Body mass index is 30.73 kg/m.  Constitutional:  oriented to person, place, and time. No distress.  HENT:  Head: Grossly normal Eyes:  no discharge. No scleral icterus.  Neck: No JVD, no carotid bruits  Cardiovascular: Irregularly irregular, no murmurs appreciated Pulmonary/Chest: Clear to auscultation bilaterally, no wheezes or rails Abdominal: Soft.  no distension.  no tenderness.  Musculoskeletal: Normal range of motion Neurological:  normal muscle tone. Coordination normal. No atrophy Skin: Skin warm and dry Psychiatric: normal affect, pleasant  Recent Labs: 09/23/2021: BNP 81.6 12/12/2021: ALT 9; BUN 22; Creatinine, Ser 1.06; Hemoglobin 14.0; Platelets 153.0; Potassium 4.4; Sodium 142; TSH 5.56    Lipid Panel Lab Results  Component Value Date   CHOL 147 12/12/2021   HDL 53.50 12/12/2021   LDLCALC 78 12/12/2021   TRIG 76.0 12/12/2021      Wt Readings from Last 3 Encounters:  03/26/22 168 lb (76.2 kg)  02/24/22 171 lb 6 oz (77.7 kg)  12/18/21 171 lb 3.2 oz (77.7 kg)     ASSESSMENT AND PLAN:  Chronic diastolic CHF Chronic shortness of breath in the morning when she is most active such as getting dressed, combing her hair etc. Symptoms seem to get better through the course the day, Recommend she continue Lasix daily, appears euvolemic Sedentary at baseline, deconditioned :  likely contributing  Permanent atrial fibrillation (HCC)  Tolerating Eliquis 5 mg twice a day , Rate control with diltiazem 120 daily Patient assistance paperwork filled out today  Essential hypertension Blood pressure is well controlled on today's visit. No changes made to the medications.  Gait instability Walks with a walker, no recent falls Sedentary in the daytime No regular exercise program No family here today for further discussion  PVCs Not having any symptoms No further work-up.  Rare PVC on rhythm strip today   Total encounter time more than 30 minutes  Greater than 50% was spent in counseling and coordination of care with the patient    Signed, Esmond Plants, M.D., Ph.D. 03/26/2022  [place], [place]

## 2022-03-26 ENCOUNTER — Ambulatory Visit: Payer: Medicare Other | Attending: Cardiovascular Disease | Admitting: Cardiovascular Disease

## 2022-03-26 ENCOUNTER — Encounter: Payer: Self-pay | Admitting: Cardiovascular Disease

## 2022-03-26 VITALS — BP 140/70 | HR 90 | Ht 62.0 in | Wt 168.0 lb

## 2022-03-26 DIAGNOSIS — I10 Essential (primary) hypertension: Secondary | ICD-10-CM | POA: Insufficient documentation

## 2022-03-26 DIAGNOSIS — I493 Ventricular premature depolarization: Secondary | ICD-10-CM | POA: Diagnosis not present

## 2022-03-26 DIAGNOSIS — E782 Mixed hyperlipidemia: Secondary | ICD-10-CM | POA: Insufficient documentation

## 2022-03-26 DIAGNOSIS — I5032 Chronic diastolic (congestive) heart failure: Secondary | ICD-10-CM | POA: Diagnosis present

## 2022-03-26 DIAGNOSIS — I482 Chronic atrial fibrillation, unspecified: Secondary | ICD-10-CM | POA: Diagnosis not present

## 2022-03-26 MED ORDER — APIXABAN 5 MG PO TABS: 5.0000 mg | ORAL_TABLET | Freq: Two times a day (BID) | ORAL | 3 refills | Status: DC

## 2022-03-26 NOTE — Patient Instructions (Signed)
Medication Instructions:  No changes  If you need a refill on your cardiac medications before your next appointment, please call your pharmacy.   Lab work: No new labs needed  Testing/Procedures: No new testing needed  Follow-Up: At [facility], you and your health needs are our priority.  As part of our continuing mission to provide you with exceptional heart care, we have created designated Provider Care Teams.  These Care Teams include your primary Cardiologist (physician) and Advanced Practice Providers (APPs -  Physician Assistants and Nurse Practitioners) who all work together to provide you with the care you need, when you need it.  You will need a follow up appointment in 12 months  Providers on your designated Care Team:   Christopher Berge, NP Ryan Dunn, PA-C Cadence Furth, PA-C  COVID-19 Vaccine Information can be found at: [email or web address] For questions related to vaccine distribution or appointments, please email [email or web address] or call [phone].   

## 2022-04-04 ENCOUNTER — Telehealth: Payer: Self-pay | Admitting: Cardiovascular Disease

## 2022-04-04 NOTE — Telephone Encounter (Signed)
Approval letter received from [place] patient assistance foundation stating that patient has been approved to receive Eliquis free-of-charge from 04/03/22 through 06/29/2022. Letter filed in samples closet.

## 2022-04-28 ENCOUNTER — Other Ambulatory Visit: Payer: Self-pay | Admitting: Cardiovascular Disease

## 2022-06-03 ENCOUNTER — Telehealth: Payer: Self-pay | Admitting: Cardiovascular Disease

## 2022-06-03 ENCOUNTER — Other Ambulatory Visit: Payer: Self-pay | Admitting: Cardiovascular Disease

## 2022-06-03 NOTE — Telephone Encounter (Signed)
*  STAT* If patient is at the pharmacy, call can be transferred to refill team.   1. Which medications need to be refilled? (please list name of each medication and dose if known)   potassium chloride (KLOR-CON) 10 MEQ tablet   2. Which pharmacy/location (including street and city if local pharmacy) is medication to be sent to?  [id number] - [place], [place] - [address]   3. Do they need a 30 day or 90 day supply? 90 day  Patient stated she is almost out of this medication.

## 2022-06-13 ENCOUNTER — Other Ambulatory Visit: Payer: Self-pay | Admitting: Cardiovascular Disease

## 2022-07-16 ENCOUNTER — Other Ambulatory Visit: Payer: Self-pay

## 2022-07-16 ENCOUNTER — Telehealth: Payer: Self-pay | Admitting: Cardiovascular Disease

## 2022-07-16 MED ORDER — APIXABAN 5 MG PO TABS: 5.0000 mg | ORAL_TABLET | Freq: Two times a day (BID) | ORAL | 5 refills | Status: DC

## 2022-07-16 MED ORDER — DOXAZOSIN MESYLATE 8 MG PO TABS: ORAL_TABLET | ORAL | 5 refills | Status: DC

## 2022-07-16 NOTE — Telephone Encounter (Signed)
Please review refill request.     Thank you.

## 2022-07-16 NOTE — Telephone Encounter (Signed)
Prescription refill request for Eliquis received. Indication:afib Last office visit:9/23 Scr:1.0 Age: 87 Weight:76.2  kg  Prescription refilled

## 2022-07-16 NOTE — Telephone Encounter (Signed)
Requested Prescriptions   Signed Prescriptions Disp Refills   doxazosin (CARDURA) 8 MG tablet 30 tablet 5    Sig: TAKE 1/2 TABLET (4 MG TOTAL) BY MOUTH 2 (TWO) TIMES DAILY.    Authorizing Provider: Minna Merritts    Ordering User: Raelene Bott, Kerem Gilmer L

## 2022-07-16 NOTE — Telephone Encounter (Signed)
*  STAT* If patient is at the pharmacy, call can be transferred to refill team.   1. Which medications need to be refilled? (please list name of each medication and dose if known)  apixaban (ELIQUIS) 5 MG TABS tablet  doxazosin (CARDURA) 8 MG tablet   2. Which pharmacy/location (including street and city if local pharmacy) is medication to be sent to?  [id number] - [place],  - [address]    3. Do they need a 30 day or 90 day supply? 30 day

## 2022-07-25 ENCOUNTER — Other Ambulatory Visit: Payer: Self-pay | Admitting: Cardiovascular Disease

## 2022-10-13 ENCOUNTER — Other Ambulatory Visit: Payer: Self-pay | Admitting: Family Medicine

## 2022-10-13 DIAGNOSIS — Z1231 Encounter for screening mammogram for malignant neoplasm of breast: Secondary | ICD-10-CM

## 2022-10-20 ENCOUNTER — Other Ambulatory Visit: Payer: Self-pay | Admitting: Cardiovascular Disease

## 2022-10-20 NOTE — Telephone Encounter (Signed)
Prescription refill request for Eliquis received. Indication: AF Last office visit: 03/26/22  Concha Se MD Scr: 1.06 on 12/12/21  Epic Age: 87 Weight: 76.2kg  Based on above findings Eliquis  twice daily is the appropriate dose.  Refill approved.

## 2022-10-20 NOTE — Telephone Encounter (Signed)
Refill Request.  

## 2022-12-16 ENCOUNTER — Ambulatory Visit (INDEPENDENT_AMBULATORY_CARE_PROVIDER_SITE_OTHER): Payer: Medicare Other | Admitting: Family Medicine

## 2022-12-16 VITALS — Ht 62.0 in | Wt 173.0 lb

## 2022-12-16 DIAGNOSIS — Z0000 Encounter for general adult medical examination without abnormal findings: Secondary | ICD-10-CM | POA: Diagnosis not present

## 2022-12-16 NOTE — Progress Notes (Signed)
Subjective:   Linda Johnson is a 87 y.o. female who presents for Medicare Annual (Subsequent) preventive examination.  I connected with  Linda Johnson on 12/16/22 by a audio enabled telemedicine application and verified that I am speaking with the correct person using two identifiers.  Patient Location: Home  Provider Location: Office/Clinic  I discussed the limitations of evaluation and management by telemedicine. The patient expressed understanding and agreed to proceed.  Review of Systems     Cardiac Risk Factors include: advanced age (>28men, >16 women);hypertension     Objective:    Today's Vitals   12/16/22 0935  Weight: 173 lb (78.5 kg)  Height: 5\' 2"  (1.575 m)   Body mass index is 31.64 kg/m.     12/16/2022   10:13 AM 12/11/2021   10:39 AM 12/05/2020   12:05 PM 01/17/2019   10:44 AM 12/06/2018   10:03 AM 11/23/2018   12:23 PM 08/25/2018    5:00 PM  Advanced Directives  Does Patient Have a Medical Advance Directive? Yes Yes Yes Yes Yes Yes Yes  Type of Advance Directive Living will Healthcare Power of [place];Living will Healthcare Power of [place];Living will Healthcare Power of [place];Living will Healthcare Power of [place];Living will Healthcare Power of [place];Living will Healthcare Power of Attorney  Does patient want to make changes to medical advance directive? No - Patient declined Yes (Inpatient - patient defers changing a medical advance directive and declines information at this time)  Yes (MAU/Ambulatory/Procedural Areas - Information given) No - Patient declined  No - Patient declined  Copy of Healthcare Power of Attorney in Chart?  Yes - validated most recent copy scanned in chart (See row information) Yes - validated most recent copy scanned in chart (See row information) No - copy requested Yes - validated most recent copy scanned in chart (See row information) No - copy requested   Would patient like information on creating a medical advance  directive?       No - Patient declined    Current Medications (verified) Outpatient Encounter Medications as of 12/16/2022  Medication Sig   alendronate (FOSAMAX) 70 MG tablet TAKE 1 TABLET (70 MG TOTAL) EVERY 7 DAYS. TAKE WITH A FULL GLASS OF WATER ON AN EMPTY STOMACH.   apixaban (ELIQUIS) 5 MG TABS tablet TAKE 1 TABLET BY MOUTH TWICE A DAY   diltiazem (CARDIZEM CD) 120 MG 24 hr capsule TAKE 1 CAPSULE BY MOUTH EVERY DAY   doxazosin (CARDURA) 8 MG tablet TAKE 1/2 TABLET (4 MG TOTAL) BY MOUTH 2 (TWO) TIMES DAILY.   furosemide (LASIX) 20 MG tablet TAKE 1 TABLET (20 MG) BY MOUTH ONCE DAILY FOR WEIGHT GREATER THEN 173 LBS   latanoprost (XALATAN) 0.005 % ophthalmic solution INSTILL ONE DROP IN BOTH EYES AT BEDTIME.   losartan (COZAAR) 100 MG tablet TAKE 1 TABLET BY MOUTH EVERY DAY   metoprolol succinate (TOPROL-XL) 25 MG 24 hr tablet TAKE 1 TABLET (25 MG TOTAL) BY MOUTH DAILY.   potassium chloride (KLOR-CON) 10 MEQ tablet TAKE 1 TABLET BY MOUTH EVERY DAY   Multiple Vitamins-Minerals (HAIR SKIN AND NAILS FORMULA) TABS Take 1 tablet by mouth once a week. (Patient not taking: Reported on 12/16/2022)   No facility-administered encounter medications on file as of 12/16/2022.    Allergies (verified) Ace inhibitors, Hydrocodone bit-homatrop mbr, and Tramadol hcl   History: Past Medical History:  Diagnosis Date   Arthritis    hips, knees   Arthritis    Atrial fibrillation (HCC)  chronic   Back pain    Balance problem    Cardiomegaly    Cataract 2020   Right eye December 06, 2018, Left eye date pending   CHF (congestive heart failure) (HCC) 07/2018   chronic   Hypertension    Osteopenia    Pleural effusion 08/25/2018   [facility]   Shingles 08/2017   H/O   SOB (shortness of breath)    Past Surgical History:  Procedure Laterality Date   BREAST BIOPSY Left 2011   2 areas, cysts per pt   CATARACT EXTRACTION W/PHACO Right 12/06/2018   Procedure: CATARACT EXTRACTION PHACO AND INTRAOCULAR LENS  PLACEMENT ([facility]) RIGHT;  Surgeon: [place] Crane, MD;  [facility];  Service: Ophthalmology;  Laterality: Right;   CATARACT EXTRACTION W/PHACO Left 01/17/2019   Procedure: CATARACT EXTRACTION PHACO AND INTRAOCULAR LENS PLACEMENT ([facility]) LEFT;  Surgeon: Nevada Crane, MD;  [facility];  Service: Ophthalmology;  Laterality: Left;   Family History  Problem Relation Age of Onset   Leukemia Sister    Breast cancer Sister 58   Breast cancer Maternal Aunt 78   Social History   Socioeconomic History   Marital status: Divorced    Spouse name: Not on file   Number of children: 3   Years of education: 12   Highest education level: High school graduate  Occupational History   Occupation: retired  Tobacco Use   Smoking status: Never   Smokeless tobacco: Never  Building services engineer Use: Never used  Substance and Sexual Activity   Alcohol use: No    Alcohol/week: 0.0 standard drinks of alcohol   Drug use: No   Sexual activity: Never  Other Topics Concern   Not on file  Social History Narrative   Not on file   Social Determinants of Health   Financial Resource Strain: Low Risk  (12/16/2022)   Overall Financial Resource Strain (CARDIA)    Difficulty of Paying Living Expenses: Not hard at all  Food Insecurity: No Food Insecurity (12/16/2022)   Hunger Vital Sign    Worried About Running Out of Food in the Last Year: Never true    Ran Out of Food in the Last Year: Never true  Transportation Needs: No Transportation Needs (12/16/2022)   PRAPARE - Administrator, Civil Service (Medical): No    Lack of Transportation (Non-Medical): No  Physical Activity: Insufficiently Active (12/16/2022)   Exercise Vital Sign    Days of Exercise per Week: 7 days    Minutes of Exercise per Session: 20 min  Stress: No Stress Concern Present (12/16/2022)   [employer] of Occupational Health - Occupational Stress Questionnaire    Feeling of Stress : Not  at all  Social Connections: Socially Isolated (12/16/2022)   Social Connection and Isolation Panel [NHANES]    Frequency of Communication with Friends and Family: More than three times a week    Frequency of Social Gatherings with Friends and Family: Twice a week    Attends Religious Services: Never    Database administrator or Organizations: No    Attends Engineer, structural: Never    Marital Status: Divorced    Tobacco Counseling Counseling given: Not Answered   Clinical Intake:  Pre-visit preparation completed: Yes  Pain : No/denies pain     BMI - recorded: 31.64 Nutritional Status: BMI > 30  Obese Nutritional Risks: Unintentional weight gain Diabetes: No  How often do you need  to have someone help you when you read instructions, pamphlets, or other written materials from your doctor or pharmacy?: 1 - Never What is the last grade level you completed in school?: high school 12 grade  Interpreter Needed?: No  Comments: pt lives alone and is very independent on her own, only has a cleaner due to daughter's will. Information entered by :: Jaidynn Balster, CMA   Activities of Daily Living    12/16/2022    9:43 AM  In your present state of health, do you have any difficulty performing the following activities:  Hearing? 1  Vision? 0  Difficulty concentrating or making decisions? 0  Walking or climbing stairs? 1  Comment uses a walker  Dressing or bathing? 0  Doing errands, shopping? 0  Preparing Food and eating ? N  Using the Toilet? N  In the past six months, have you accidently leaked urine? Y  Comment sometimes, wears a pad for precautions, takes furosemide  Do you have problems with loss of bowel control? N  Managing your Medications? N  Managing your Finances? N  Housekeeping or managing your Housekeeping? Y    Patient Care Team: [place], Audrie Gallus, MD as PCP - General Mariah Milling, Tollie Pizza, MD as Consulting Physician (Cardiology)  Indicate any recent  Medical Services you may have received from other than [facility] providers in the past year (date may be approximate).     Assessment:   This is a routine wellness examination for [place].  Hearing/Vision screen Hearing Screening - Comments:: Has some difficulties hearing, has been a couple years since her last hearing test.  Dietary issues and exercise activities discussed:     Goals Addressed             This Visit's Progress    DIET - EAT MORE FRUITS AND VEGETABLES        Depression Screen    12/16/2022   10:14 AM 12/18/2021   11:21 AM 12/11/2021   10:35 AM 12/05/2020   12:07 PM 11/29/2019   11:40 AM 11/23/2018   12:14 PM 10/02/2017   11:35 AM  PHQ 2/9 Scores  PHQ - 2 Score 0 0 0 0 0 0 0  PHQ- 9 Score 3  0 0  0 0    Fall Risk    12/16/2022    9:49 AM 12/18/2021   11:21 AM 12/11/2021   10:40 AM 12/05/2020   12:06 PM 11/29/2019   11:41 AM  Fall Risk   Falls in the past year? 0 0 0 0 0  Number falls in past yr: 0  0 0 0  Injury with Fall? 0  0 0 0  Risk for fall due to : No Fall Risks  No Fall Risks Medication side effect   Follow up Education provided;Falls prevention discussed Falls evaluation completed Falls evaluation completed Falls evaluation completed;Falls prevention discussed Falls evaluation completed    MEDICARE RISK AT HOME:   TIMED UP AND GO:  Was the test performed?  No    Cognitive Function:    12/05/2020   12:11 PM 11/23/2018   12:14 PM 10/02/2017   11:36 AM 09/23/2016    2:05 PM 09/13/2015   11:06 AM  MMSE - Mini Mental State Exam  Not completed: Refused      Orientation to time  5 5 5 5   Orientation to Place  5 5 5 5   Registration  3 3 3 3   Attention/ Calculation  0 0 0 5  Recall  3 3 3 3   Language- name 2 objects  0 0 0 0  Language- repeat  1 1 1 1   Language- follow 3 step command  0 2 3 3   Language- follow 3 step command-comments   unable to follow 1 step of 3 step command    Language- read & follow direction  0 0 0 1  Write a sentence  0 0 0 0   Copy design  0 0 0 0  Total score  17 19 20 26         12/16/2022   10:19 AM  6CIT Screen  What Year? 0 points  What month? 0 points  What time? 0 points  Count back from 20 2 points  Months in reverse 4 points  Repeat phrase 0 points  Total Score 6 points    Immunizations Immunization History  Administered Date(s) Administered   Fluad Quad(high Dose 65+) 03/01/2019   Influenza Split 04/01/2011, 03/17/2012   Influenza Whole 04/23/2006, 04/02/2007, 03/27/2008, 04/16/2009, 04/23/2010   Influenza, High Dose Seasonal PF 03/23/2020, 04/04/2021   Influenza,inj,Quad PF,6+ Mos 03/24/2013, 04/10/2014, 04/13/2015, 03/19/2016, 03/25/2017, 05/06/2018   PFIZER(Purple Top)SARS-COV-2 Vaccination 07/11/2019, 08/01/2019, 03/23/2020   Pfizer Covid-19 Vaccine Bivalent Booster 46yrs & up 04/04/2021   Pneumococcal Conjugate-13 09/11/2014   Pneumococcal Polysaccharide-23 04/16/2009   Td 04/23/2006   Zoster Recombinat (Shingrix) 03/22/2019, 06/10/2019    TDAP status: Up to date  Flu Vaccine status: Up to date  Pneumococcal vaccine status: Up to date  Covid-19 vaccine status: Completed vaccines  Qualifies for Shingles Vaccine? Yes   Zostavax completed Yes   Shingrix Completed?: Yes  Screening Tests Health Maintenance  Topic Date Due   DTaP/Tdap/Td (2 - Tdap) 04/23/2016   MAMMOGRAM  12/04/2022   INFLUENZA VACCINE  01/29/2023   Medicare Annual Wellness (AWV)  12/16/2023   Pneumonia Vaccine 93+ Years old  Completed   DEXA SCAN  Completed   COVID-19 Vaccine  Completed   Zoster Vaccines- Shingrix  Completed   HPV VACCINES  Aged Out    Health Maintenance  Health Maintenance Due  Topic Date Due   DTaP/Tdap/Td (2 - Tdap) 04/23/2016   MAMMOGRAM  12/04/2022    Colorectal cancer screening: No longer required.  WAS NOT REQUIRED PER PT AT LAST YEARS ASSESMENT  Mammogram status: Completed 12/03/21. Repeat every year  Bone Density status: Completed 05/09/2020. Results reflect: Bone  density results: OSTEOPOROSIS. Repeat every 2 years.  Lung Cancer Screening: (Low Dose CT Chest recommended if Age 39-80 years, 20 pack-year currently smoking OR have quit w/in 15years.) does not qualify.   Lung Cancer Screening Referral: N/A  Additional Screening:  Hepatitis C Screening: does qualify; DUE WITH NEXT LABS  Vision Screening: Recommended annual ophthalmology exams for early detection of glaucoma and other disorders of the eye. Is the patient up to date with their annual eye exam?  Yes  Who is the provider or what is the name of the office in which the patient attends annual eye exams? [facility], Dr. Brooke Dare having laser surgery on the June 27th, post cataract If pt is not established with a provider, would they like to be referred to a provider to establish care? No.   Dental Screening: Recommended annual dental exams for proper oral hygiene  Diabetic Foot Exam: NA  Community Resource Referral / Chronic Care Management: CRR required this visit?  No   CCM required this visit?  No     Plan:     I have personally reviewed  and noted the following in the patient's chart:   Medical and social history Use of alcohol, tobacco or illicit drugs  Current medications and supplements including opioid prescriptions. Patient is not currently taking opioid prescriptions. Functional ability and status Nutritional status Physical activity Advanced directives List of other physicians Hospitalizations, surgeries, and ER visits in previous 12 months Vitals Screenings to include cognitive, depression, and falls Referrals and appointments  In addition, I have reviewed and discussed with patient certain preventive protocols, quality metrics, and best practice recommendations. A written personalized care plan for preventive services as well as general preventive health recommendations were provided to patient.     Benjy Kana  Arna Medici, CMA   12/16/2022   After Visit Summary:  (MyChart) Due to this being a telephonic visit, the after visit summary with patients personalized plan was offered to patient via MyChart   Nurse Notes: pt is very independent and daughter Misty Stanley is Social worker (does not live w/ pt)

## 2022-12-16 NOTE — Patient Instructions (Signed)
Linda Johnson , Thank you for taking time to come for your Medicare Wellness Visit. I appreciate your ongoing commitment to your health goals. Please review the following plan we discussed and let me know if I can assist you in the future.   These are the goals we discussed:  Goals      DIET - EAT MORE FRUITS AND VEGETABLES     DIET - EAT MORE FRUITS AND VEGETABLES     Eat more fruits and vegetables     Starting 11/23/18, I will continue to eat at least 5 svgs of fresh fruits and vegetables daily.      Patient Stated     12/05/2020, I will maintain and continue medications as prescribed.        This is a list of the screening recommended for you and due dates:  Health Maintenance  Topic Date Due   DTaP/Tdap/Td vaccine (2 - Tdap) 04/23/2016   Mammogram  12/04/2022   Flu Shot  01/29/2023   Medicare Annual Wellness Visit  12/16/2023   Pneumonia Vaccine  Completed   DEXA scan (bone density measurement)  Completed   COVID-19 Vaccine  Completed   Zoster (Shingles) Vaccine  Completed   HPV Vaccine  Aged Out   Health Maintenance After Age 37 After age 10, you are at a higher risk for certain long-term diseases and infections as well as injuries from falls. Falls are a major cause of broken bones and head injuries in people who are older than age 19. Getting regular preventive care can help to keep you healthy and well. Preventive care includes getting regular testing and making lifestyle changes as recommended by your health care provider. Talk with your health care provider about: Which screenings and tests you should have. A screening is a test that checks for a disease when you have no symptoms. A diet and exercise plan that is right for you. What should I know about screenings and tests to prevent falls? Screening and testing are the best ways to find a health problem early. Early diagnosis and treatment give you the best chance of managing medical conditions that are common after age 4.  Certain conditions and lifestyle choices may make you more likely to have a fall. Your health care provider may recommend: Regular vision checks. Poor vision and conditions such as cataracts can make you more likely to have a fall. If you wear glasses, make sure to get your prescription updated if your vision changes. Medicine review. Work with your health care provider to regularly review all of the medicines you are taking, including over-the-counter medicines. Ask your health care provider about any side effects that may make you more likely to have a fall. Tell your health care provider if any medicines that you take make you feel dizzy or sleepy. Strength and balance checks. Your health care provider may recommend certain tests to check your strength and balance while standing, walking, or changing positions. Foot health exam. Foot pain and numbness, as well as not wearing proper footwear, can make you more likely to have a fall. Screenings, including: Osteoporosis screening. Osteoporosis is a condition that causes the bones to get weaker and break more easily. Blood pressure screening. Blood pressure changes and medicines to control blood pressure can make you feel dizzy. Depression screening. You may be more likely to have a fall if you have a fear of falling, feel depressed, or feel unable to do activities that you used to do. Alcohol  use screening. Using too much alcohol can affect your balance and may make you more likely to have a fall. Follow these instructions at home: Lifestyle Do not drink alcohol if: Your health care provider tells you not to drink. If you drink alcohol: Limit how much you have to: 0-1 drink a day for women. 0-2 drinks a day for men. Know how much alcohol is in your drink. In the [place] one drink equals one 12 oz bottle of beer (355 mL), one 5 oz glass of wine (148 mL), or one 1 oz glass of hard liquor (44 mL). Do not use any products that contain nicotine or  tobacco. These products include cigarettes, chewing tobacco, and vaping devices, such as e-cigarettes. If you need help quitting, ask your health care provider. Activity  Follow a regular exercise program to stay fit. This will help you maintain your balance. Ask your health care provider what types of exercise are appropriate for you. If you need a cane or walker, use it as recommended by your health care provider. Wear supportive shoes that have nonskid soles. Safety  Remove any tripping hazards, such as rugs, cords, and clutter. Install safety equipment such as grab bars in bathrooms and safety rails on stairs. Keep rooms and walkways well-lit. General instructions Talk with your health care provider about your risks for falling. Tell your health care provider if: You fall. Be sure to tell your health care provider about all falls, even ones that seem minor. You feel dizzy, tiredness (fatigue), or off-balance. Take over-the-counter and prescription medicines only as told by your health care provider. These include supplements. Eat a healthy diet and maintain a healthy weight. A healthy diet includes low-fat dairy products, low-fat (lean) meats, and fiber from whole grains, beans, and lots of fruits and vegetables. Stay current with your vaccines. Schedule regular health, dental, and eye exams. Summary Having a healthy lifestyle and getting preventive care can help to protect your health and wellness after age 36. Screening and testing are the best way to find a health problem early and help you avoid having a fall. Early diagnosis and treatment give you the best chance for managing medical conditions that are more common for people who are older than age 78. Falls are a major cause of broken bones and head injuries in people who are older than age 19. Take precautions to prevent a fall at home. Work with your health care provider to learn what changes you can make to improve your health and  wellness and to prevent falls. This information is not intended to replace advice given to you by your health care provider. Make sure you discuss any questions you have with your health care provider. Document Revised: 11/05/2020 Document Reviewed: 11/05/2020 Elsevier Patient Education  2024 [employer].

## 2022-12-23 ENCOUNTER — Encounter: Payer: Self-pay | Admitting: Family Medicine

## 2022-12-23 ENCOUNTER — Ambulatory Visit (INDEPENDENT_AMBULATORY_CARE_PROVIDER_SITE_OTHER): Payer: Medicare Other | Admitting: Family Medicine

## 2022-12-23 VITALS — BP 130/72 | HR 95 | Temp 97.9°F | Ht 62.75 in | Wt 167.4 lb

## 2022-12-23 DIAGNOSIS — R7303 Prediabetes: Secondary | ICD-10-CM

## 2022-12-23 DIAGNOSIS — E2839 Other primary ovarian failure: Secondary | ICD-10-CM

## 2022-12-23 DIAGNOSIS — E6609 Other obesity due to excess calories: Secondary | ICD-10-CM

## 2022-12-23 DIAGNOSIS — E039 Hypothyroidism, unspecified: Secondary | ICD-10-CM | POA: Diagnosis not present

## 2022-12-23 DIAGNOSIS — E782 Mixed hyperlipidemia: Secondary | ICD-10-CM | POA: Diagnosis not present

## 2022-12-23 DIAGNOSIS — M810 Age-related osteoporosis without current pathological fracture: Secondary | ICD-10-CM | POA: Diagnosis not present

## 2022-12-23 DIAGNOSIS — I4821 Permanent atrial fibrillation: Secondary | ICD-10-CM

## 2022-12-23 DIAGNOSIS — I5032 Chronic diastolic (congestive) heart failure: Secondary | ICD-10-CM

## 2022-12-23 DIAGNOSIS — Q845 Enlarged and hypertrophic nails: Secondary | ICD-10-CM

## 2022-12-23 DIAGNOSIS — Z6830 Body mass index (BMI) 30.0-30.9, adult: Secondary | ICD-10-CM

## 2022-12-23 DIAGNOSIS — Z0000 Encounter for general adult medical examination without abnormal findings: Secondary | ICD-10-CM | POA: Diagnosis not present

## 2022-12-23 DIAGNOSIS — N1831 Chronic kidney disease, stage 3a: Secondary | ICD-10-CM | POA: Insufficient documentation

## 2022-12-23 DIAGNOSIS — R944 Abnormal results of kidney function studies: Secondary | ICD-10-CM | POA: Insufficient documentation

## 2022-12-23 DIAGNOSIS — I10 Essential (primary) hypertension: Secondary | ICD-10-CM

## 2022-12-23 LAB — CBC WITH DIFFERENTIAL/PLATELET
Basophils Absolute: 0 10*3/uL (ref 0.0–0.1)
Basophils Relative: 0.6 % (ref 0.0–3.0)
Eosinophils Absolute: 0.1 10*3/uL (ref 0.0–0.7)
Eosinophils Relative: 1.2 % (ref 0.0–5.0)
HCT: 41.4 % (ref 36.0–46.0)
Hemoglobin: 13.6 g/dL (ref 12.0–15.0)
Lymphocytes Relative: 17.9 % (ref 12.0–46.0)
Lymphs Abs: 1 10*3/uL (ref 0.7–4.0)
MCHC: 32.9 g/dL (ref 30.0–36.0)
MCV: 93.4 fl (ref 78.0–100.0)
Monocytes Absolute: 0.3 10*3/uL (ref 0.1–1.0)
Monocytes Relative: 4.7 % (ref 3.0–12.0)
Neutro Abs: 4.4 10*3/uL (ref 1.4–7.7)
Neutrophils Relative %: 75.6 % (ref 43.0–77.0)
Platelets: 202 10*3/uL (ref 150.0–400.0)
RBC: 4.43 Mil/uL (ref 3.87–5.11)
RDW: 13.1 % (ref 11.5–15.5)
WBC: 5.8 10*3/uL (ref 4.0–10.5)

## 2022-12-23 LAB — COMPREHENSIVE METABOLIC PANEL
ALT: 7 U/L (ref 0–35)
AST: 12 U/L (ref 0–37)
Albumin: 3.9 g/dL (ref 3.5–5.2)
Alkaline Phosphatase: 61 U/L (ref 39–117)
BUN: 24 mg/dL — ABNORMAL HIGH (ref 6–23)
CO2: 32 mEq/L (ref 19–32)
Calcium: 9.6 mg/dL (ref 8.4–10.5)
Chloride: 98 mEq/L (ref 96–112)
Creatinine, Ser: 1.1 mg/dL (ref 0.40–1.20)
GFR: 46.08 mL/min — ABNORMAL LOW (ref >60.00)
Glucose, Bld: 119 mg/dL — ABNORMAL HIGH (ref 70–99)
Potassium: 4.5 mEq/L (ref 3.5–5.1)
Sodium: 139 mEq/L (ref 135–145)
Total Bilirubin: 0.8 mg/dL (ref 0.2–1.2)
Total Protein: 7 g/dL (ref 6.0–8.3)

## 2022-12-23 LAB — LIPID PANEL
Cholesterol: 133 mg/dL (ref 0–200)
HDL: 41.5 mg/dL (ref >39.00)
LDL Cholesterol: 74 mg/dL (ref 0–99)
NonHDL: 91.74
Total CHOL/HDL Ratio: 3
Triglycerides: 88 mg/dL (ref 0.0–149.0)
VLDL: 17.6 mg/dL (ref 0.0–40.0)

## 2022-12-23 LAB — TSH: TSH: 4.54 u[IU]/mL (ref 0.35–5.50)

## 2022-12-23 LAB — VITAMIN D 25 HYDROXY (VIT D DEFICIENCY, FRACTURES): VITD: 69.7 ng/mL (ref 30.00–100.00)

## 2022-12-23 LAB — HEMOGLOBIN A1C: Hgb A1c MFr Bld: 5.6 % (ref 4.6–6.5)

## 2022-12-23 NOTE — Assessment & Plan Note (Signed)
Discussed how this problem influences overall health and the risks it imposes  Reviewed plan for weight loss with lower calorie diet (via better food choices and also portion control or program like weight watchers) and exercise building up to or more than 30 minutes 5 days per week including some aerobic activity   Exercise is limited  Encouraged low glycemic diet

## 2022-12-23 NOTE — Assessment & Plan Note (Signed)
Reviewed health habits including diet and exercise and skin cancer prevention Reviewed appropriate screening tests for age  Also reviewed health mt list, fam hx and immunization status , as well as social and family history   See HPI Labs reviewed and ordered Will check in with pharmacy re: Td update Mammogram 11/2021-is scheduled for July  Colonoscopy 10 with no recall due to age  Dexa ordered  No falls or fracture  PHQ of 3 due to fatigue from sleep problems

## 2022-12-23 NOTE — Assessment & Plan Note (Signed)
No clinical changes Under care of cardiology  Continues cardizem and metoprolol

## 2022-12-23 NOTE — Assessment & Plan Note (Signed)
Lab today for lipids   Disc goals for lipids and reasons to control them Rev last labs with pt Rev low sat fat diet in detail Diet controlled

## 2022-12-23 NOTE — Assessment & Plan Note (Signed)
TSH ordered No clinical changes No thyroid supplement at this time

## 2022-12-23 NOTE — Assessment & Plan Note (Signed)
A1c ordered disc imp of low glycemic diet and wt loss to prevent DM2  

## 2022-12-23 NOTE — Assessment & Plan Note (Signed)
Ref to podiatry to help trim nails

## 2022-12-23 NOTE — Assessment & Plan Note (Signed)
bp in fair control at this time  BP Readings from Last 1 Encounters:  12/23/22 130/72   No changes needed Most recent labs reviewed  Disc lifstyle change with low sodium diet and exercise  Under card care Plan to continue Cardizem 120 mg daily  Cardura 4 mg bid Lasix 20 mg daily Metoprolol xl 25 mg daily Losartan 100 mg daily  Also under cardiology care

## 2022-12-23 NOTE — Patient Instructions (Addendum)
Take care of yourself   You are due for a tetanus shot  Call your pharmacy for that   I put the referral in for podiatry  Please let us know if you don't hear in 1-2 weeks    You have an order for:  []   2D Mammogram  []   3D Mammogram  [x]   Bone Density     Please call for appointment:   [x]   [facility]  [address] [place] [address]  [phone]  []   [facility])   [address]  [place], [place] [address]  [phone]  []   [facility]      [address] [place], [place]        [phone]         []   [facility]  [address] [place], [place]  [phone]  []  [facility]   [address]   [place], [place] [address]  [phone]  []  [facility]  [address] [place], [place] [address] [phone]    Make sure to wear two piece clothing  No lotions powders or deodorants the day of the appointment Make sure to bring picture ID and insurance card.  Bring list of medications you are currently taking including any supplements.   Schedule your screening mammogram through MyChart!   Select [place] imaging sites can now be scheduled through MyChart.  Log into your MyChart account.  Go to 'Visit' (or 'Appointments' if  on mobile App) --> Schedule an  Appointment  Under 'Select a Reason for Visit' choose the Mammogram  Screening option.  Complete the pre-visit questions  and select the time and place that  best fits your schedule    It was very nice to see you today!   PLEASE NOTE:   If you had any lab tests please let us know if you have not heard back within a few days. You may see your results on MyChart before we have a chance to review them but we will give you a call once they are  reviewed by [place]. If we ordered any referrals today, please let us know if you have not heard from their office within the next 2 weeks. You should receive a letter via MyChart confirming if the referral was approved and their office contact information to schedule.  Please try these tips to maintain a healthy lifestyle:  Eat most of your calories during the day when you are active. Eliminate processed foods including packaged sweets (pies, cakes, cookies), reduce intake of potatoes, white bread, white pasta, and white rice. Look for whole grain options, oat flour or almond flour.  Each meal should contain half fruits/vegetables, one quarter protein, and one quarter carbs (no bigger than a computer mouse).  Cut down on sweet beverages. This includes juice, soda, and sweet tea. Also watch fruit intake, though this is a healthier sweet option, it still contains natural sugar! Limit to 3 servings daily.  Drink at least 1 glass of water with each meal and aim for at least 8 glasses per day  Exercise at least 150 minutes every week.

## 2022-12-23 NOTE — Assessment & Plan Note (Signed)
Clinically stable Under care of cardiology   Lasix 20 mg  Losartan 100 mg daily

## 2022-12-23 NOTE — Progress Notes (Signed)
Subjective:    Patient ID: Linda Johnson, female    DOB: 07/18/1935, 87 y.o.   MRN: [id number]  HPI  Here for health maintenance exam and to review chronic medical problems   Wt Readings from Last 3 Encounters:  12/23/22 167 lb 6.4 oz (75.9 kg)  12/16/22 173 lb (78.5 kg)  03/26/22 168 lb (76.2 kg)   29.89 kg/m  Vitals:   12/23/22 1134  BP: 130/72  Pulse: 95  Temp: 97.9 F (36.6 C)  SpO2: 98%   Enjoying being at home this summer  Daughter visited [place] [facility]  She is eating a bit better  Lost some weight       Immunization History  Administered Date(s) Administered   COVID-19, mRNA, vaccine(Comirnaty)12 years and older 04/09/2022   Fluad Quad(high Dose 65+) 03/01/2019, 04/09/2022   Influenza Split 04/01/2011, 03/17/2012   Influenza Whole 04/23/2006, 04/02/2007, 03/27/2008, 04/16/2009, 04/23/2010   Influenza, High Dose Seasonal PF 03/23/2020, 04/04/2021   Influenza,inj,Quad PF,6+ Mos 03/24/2013, 04/10/2014, 04/13/2015, 03/19/2016, 03/25/2017, 05/06/2018   PFIZER(Purple Top)SARS-COV-2 Vaccination 07/11/2019, 08/01/2019, 03/23/2020   Pfizer Covid-19 Vaccine Bivalent Booster 50yrs & up 04/04/2021   Pneumococcal Conjugate-13 09/11/2014   Pneumococcal Polysaccharide-23 04/16/2009   Td 04/23/2006   Zoster Recombinat (Shingrix) 03/22/2019, 06/10/2019    Health Maintenance Due  Topic Date Due   DTaP/Tdap/Td (2 - Tdap) 04/23/2016   MAMMOGRAM  12/04/2022   Td 07  Mammogram 11/2021 - appt is scheduled in July  Self breast exam-no lumps   Gyn care/ pap -no problems    Colon cancer screening  Colonoscopy 08  no recall due to age     Dexa 04/2020 OSTEOPOROSIS is worse  Taking 2nd course of alendronate sunce 04/2020 - will plan 5 y    Falls-none Fractures-none  Supplements  Last vitamin D Lab Results  Component Value Date   VD25OH 69.70 12/23/2022   Held D for this  Exercise -limited/ walks with walker    Mood    12/23/2022   11:33 AM 12/16/2022    10:14 AM 12/18/2021   11:21 AM 12/11/2021   10:35 AM 12/05/2020   12:07 PM  Depression screen PHQ 2/9  Decreased Interest 0 0 0 0 0  Down, Depressed, Hopeless 0 0 0 0 0  PHQ - 2 Score 0 0 0 0 0  Altered sleeping 0 0  0 0  Tired, decreased energy 3 3  0 0  Change in appetite 0 0  0 0  Feeling bad or failure about yourself  0 0  0 0  Trouble concentrating 0 0  0 0  Moving slowly or fidgety/restless 0 0  0 0  Suicidal thoughts 0 0  0 0  PHQ-9 Score 3 3  0 0  Difficult doing work/chores Not difficult at all Not difficult at all  Not difficult at all Not difficult at all    HTN bp is stable today  No cp or palpitations or headaches or edema  No side effects to medicines  BP Readings from Last 3 Encounters:  12/23/22 130/72  03/26/22 (!) 140/70  02/24/22 120/72    Pulse Readings from Last 3 Encounters:  12/23/22 95  03/26/22 90  02/24/22 86   In setting of a fib  Cardizem 120 mg daily  Cardura 4 mg bid Lasix 20 mg daily Metoprolol xl 25 mg daily Losartan 100 mg daily   Under cardiology care for this and CHF (diastolic)   Hyperlipidemia Lab Results  Component Value  Date   CHOL 133 12/23/2022   HDL 41.50 12/23/2022   LDLCALC 74 12/23/2022   LDLDIRECT 132.4 05/06/2010   TRIG 88.0 12/23/2022   CHOLHDL 3 12/23/2022   Needs help with toe nail care     Hypothyroidism  Pt has no clinical changes No change in energy level/ hair or skin/ edema and no tremor Lab Results  Component Value Date   TSH 4.54 12/23/2022    Due for labs  Did not want to start supplementation last year    Patient Active Problem List   Diagnosis Date Noted   Enlarged and hypertrophic nails 12/23/2022   History of COVID-19 11/23/2020   Chronic diastolic CHF (congestive heart failure) (HCC) 08/30/2018   Pedal edema 08/23/2018   PVC (premature ventricular contraction) 01/18/2018   Poor balance 10/05/2017   Post herpetic neuralgia 09/23/2017   Encounter for anticoagulation discussion and  counseling 09/20/2015   Irregular heart rate 09/17/2015   Permanent atrial fibrillation (HCC) 09/17/2015   Hearing loss 09/17/2015   Hypothyroidism 12/12/2014   Prediabetes 09/11/2014   Encounter for Medicare annual wellness exam 09/11/2014   Estrogen deficiency 09/11/2014   Routine general medical examination at a health care facility 09/09/2013   Low back pain 03/21/2013   Other screening mammogram 04/19/2012   Post-menopausal 04/19/2012   Obesity 08/18/2011   Hyperlipidemia 06/12/2010   Osteoporosis 06/12/2010   ALLERGIC RHINITIS CAUSE UNSPECIFIED 04/12/2008   HX, PERSONAL, COLONIC POLYPS 04/02/2007   STRABISMUS 03/02/2007   Essential hypertension 03/02/2007   Past Medical History:  Diagnosis Date   Arthritis    hips, knees   Arthritis    Atrial fibrillation (HCC)    chronic   Back pain    Balance problem    Cardiomegaly    Cataract 2020   Right eye December 06, 2018, Left eye date pending   CHF (congestive heart failure) (HCC) 07/2018   chronic   Hypertension    Osteopenia    Pleural effusion 08/25/2018   [facility]   Shingles 08/2017   H/O   SOB (shortness of breath)    Past Surgical History:  Procedure Laterality Date   BREAST BIOPSY Left 2011   2 areas, cysts per pt   CATARACT EXTRACTION W/PHACO Right 12/06/2018   Procedure: CATARACT EXTRACTION PHACO AND INTRAOCULAR LENS PLACEMENT (IOC) RIGHT;  Surgeon: Nevada Crane, MD;  Location: [facility];  Service: Ophthalmology;  Laterality: Right;   CATARACT EXTRACTION W/PHACO Left 01/17/2019   Procedure: CATARACT EXTRACTION PHACO AND INTRAOCULAR LENS PLACEMENT (IOC) LEFT;  Surgeon: Nevada Crane, MD;  Location: [facility];  Service: Ophthalmology;  Laterality: Left;   Social History   Tobacco Use   Smoking status: Never   Smokeless tobacco: Never  Vaping Use   Vaping Use: Never used  Substance Use Topics   Alcohol use: No    Alcohol/week: 0.0 standard drinks of alcohol   Drug use: No    Family History  Problem Relation Age of Onset   Leukemia Sister    Breast cancer Sister 54   Breast cancer Maternal Aunt 50   Allergies  Allergen Reactions   Ace Inhibitors Cough        Hydrocodone Bit-Homatrop Mbr Nausea Only   Tramadol Hcl Nausea Only        Current Outpatient Medications on File Prior to Visit  Medication Sig Dispense Refill   alendronate (FOSAMAX) 70 MG tablet TAKE 1 TABLET (70 MG TOTAL) EVERY 7 DAYS. TAKE WITH A FULL  GLASS OF WATER ON AN EMPTY STOMACH. 12 tablet 3   apixaban (ELIQUIS) 5 MG TABS tablet TAKE 1 TABLET BY MOUTH TWICE A DAY 60 tablet 5   diltiazem (CARDIZEM CD) 120 MG 24 hr capsule TAKE 1 CAPSULE BY MOUTH EVERY DAY 90 capsule 3   doxazosin (CARDURA) 8 MG tablet TAKE 1/2 TABLET (4 MG TOTAL) BY MOUTH 2 (TWO) TIMES DAILY. 30 tablet 5   furosemide (LASIX) 20 MG tablet TAKE 1 TABLET (20 MG) BY MOUTH ONCE DAILY FOR WEIGHT GREATER THEN 173 LBS 90 tablet 1   latanoprost (XALATAN) 0.005 % ophthalmic solution INSTILL ONE DROP IN BOTH EYES AT BEDTIME.  3   losartan (COZAAR) 100 MG tablet TAKE 1 TABLET BY MOUTH EVERY DAY 90 tablet 2   metoprolol succinate (TOPROL-XL) 25 MG 24 hr tablet TAKE 1 TABLET (25 MG TOTAL) BY MOUTH DAILY. 90 tablet 2   potassium chloride (KLOR-CON) 10 MEQ tablet TAKE 1 TABLET BY MOUTH EVERY DAY 90 tablet 2   No current facility-administered medications on file prior to visit.    Review of Systems  Constitutional:  Negative for activity change, appetite change, fatigue, fever and unexpected weight change.  HENT:  Positive for hearing loss. Negative for congestion, ear pain, rhinorrhea, sinus pressure and sore throat.   Eyes:  Negative for pain, redness and visual disturbance.  Respiratory:  Negative for cough, shortness of breath and wheezing.   Cardiovascular:  Negative for chest pain and palpitations.  Gastrointestinal:  Negative for abdominal pain, blood in stool, constipation and diarrhea.  Endocrine: Negative for polydipsia and  polyuria.  Genitourinary:  Negative for dysuria, frequency and urgency.  Musculoskeletal:  Positive for arthralgias and back pain. Negative for myalgias.  Skin:  Negative for pallor and rash.  Allergic/Immunologic: Negative for environmental allergies.  Neurological:  Negative for dizziness, syncope and headaches.  Hematological:  Negative for adenopathy. Does not bruise/bleed easily.  Psychiatric/Behavioral:  Negative for decreased concentration and dysphoric mood. The patient is not nervous/anxious.        Objective:   Physical Exam Constitutional:      General: She is not in acute distress.    Appearance: Normal appearance. She is well-developed. She is obese. She is not ill-appearing or diaphoretic.  HENT:     Head: Normocephalic and atraumatic.     Right Ear: Tympanic membrane, ear canal and external ear normal.     Left Ear: Tympanic membrane, ear canal and external ear normal.     Nose: Nose normal. No congestion.     Mouth/Throat:     Mouth: Mucous membranes are moist.     Pharynx: Oropharynx is clear. No posterior oropharyngeal erythema.  Eyes:     General: No scleral icterus.    Extraocular Movements: Extraocular movements intact.     Conjunctiva/sclera: Conjunctivae normal.     Pupils: Pupils are equal, round, and reactive to light.  Neck:     Thyroid: No thyromegaly.     Vascular: No carotid bruit or JVD.  Cardiovascular:     Rate and Rhythm: Normal rate and regular rhythm.     Pulses: Normal pulses.     Heart sounds: Normal heart sounds.     No gallop.  Pulmonary:     Effort: Pulmonary effort is normal. No respiratory distress.     Breath sounds: Normal breath sounds. No wheezing.     Comments: Good air exch Chest:     Chest wall: No tenderness.  Abdominal:  General: Bowel sounds are normal. There is no distension or abdominal bruit.     Palpations: Abdomen is soft. There is no mass.     Tenderness: There is no abdominal tenderness.     Hernia: No hernia  is present.  Genitourinary:    Comments: Breast exam: No mass, nodules, thickening, tenderness, bulging, retraction, inflamation, nipple discharge or skin changes noted.  No axillary or clavicular LA.     Musculoskeletal:        General: No tenderness. Normal range of motion.     Cervical back: Normal range of motion and neck supple. No rigidity. No muscular tenderness.     Right lower leg: No edema.     Left lower leg: No edema.     Comments: No kyphosis   Lymphadenopathy:     Cervical: No cervical adenopathy.  Skin:    General: Skin is warm and dry.     Coloration: Skin is not pale.     Findings: No erythema or rash.     Comments: Solar lentigines diffusely Some sks    Toe nails are thickened and long   Neurological:     Mental Status: She is alert. Mental status is at baseline.     Cranial Nerves: No cranial nerve deficit.     Motor: No abnormal muscle tone.     Coordination: Coordination normal.     Gait: Gait normal.     Deep Tendon Reflexes: Reflexes are normal and symmetric. Reflexes normal.  Psychiatric:        Mood and Affect: Mood normal.        Cognition and Memory: Cognition and memory normal.           Assessment & Plan:   Problem List Items Addressed This Visit       Cardiovascular and Mediastinum   Permanent atrial fibrillation (HCC)    No clinical changes Under care of cardiology  Continues cardizem and metoprolol       Essential hypertension    bp in fair control at this time  BP Readings from Last 1 Encounters:  12/23/22 130/72  No changes needed Most recent labs reviewed  Disc lifstyle change with low sodium diet and exercise  Under card care Plan to continue Cardizem 120 mg daily  Cardura 4 mg bid Lasix 20 mg daily Metoprolol xl 25 mg daily Losartan 100 mg daily  Also under cardiology care      Relevant Orders   CBC with Differential/Platelet (Completed)   Comprehensive metabolic panel (Completed)   Lipid panel (Completed)    TSH (Completed)   Chronic diastolic CHF (congestive heart failure) (HCC)    Clinically stable Under care of cardiology   Lasix 20 mg  Losartan 100 mg daily         Endocrine   Hypothyroidism    TSH ordered No clinical changes No thyroid supplement at this time       Relevant Orders   TSH (Completed)     Musculoskeletal and Integument   Osteoporosis    No falls or fracture  Dexa 04/2020- ordered the next one and pt will schedule  D level pending (high last year) Exercise is limited/uses a walker       Relevant Orders   VITAMIN D 25 Hydroxy (Vit-D Deficiency, Fractures) (Completed)   Enlarged and hypertrophic nails    Ref to podiatry to help trim nails       Relevant Orders   Ambulatory referral to Podiatry  Other   Routine general medical examination at a health care facility - Primary    Reviewed health habits including diet and exercise and skin cancer prevention Reviewed appropriate screening tests for age  Also reviewed health mt list, fam hx and immunization status , as well as social and family history   See HPI Labs reviewed and ordered Will check in with pharmacy re: Td update Mammogram 11/2021-is scheduled for July  Colonoscopy 08 with no recall due to age  Dexa ordered  No falls or fracture  PHQ of 3 due to fatigue from sleep problems        Prediabetes    A1c ordered disc imp of low glycemic diet and wt loss to prevent DM2       Relevant Orders   Hemoglobin A1c (Completed)   Obesity    Discussed how this problem influences overall health and the risks it imposes  Reviewed plan for weight loss with lower calorie diet (via better food choices and also portion control or program like weight watchers) and exercise building up to or more than 30 minutes 5 days per week including some aerobic activity   Exercise is limited  Encouraged low glycemic diet       Hyperlipidemia    Lab today for lipids   Disc goals for lipids and reasons to  control them Rev last labs with pt Rev low sat fat diet in detail Diet controlled       Relevant Orders   Lipid panel (Completed)   Estrogen deficiency    Dexa ordered      Relevant Orders   DG Bone Density

## 2022-12-23 NOTE — Assessment & Plan Note (Signed)
Dexa ordered

## 2022-12-23 NOTE — Assessment & Plan Note (Signed)
No falls or fracture  Dexa 04/2020- ordered the next one and pt will schedule  D level pending (high last year) Exercise is limited/uses a walker

## 2022-12-24 ENCOUNTER — Telehealth: Payer: Self-pay | Admitting: Family Medicine

## 2022-12-24 NOTE — Telephone Encounter (Signed)
Pt called requesting lab results. Told pt Dr. Royden Purl response. Pt had no questions/concerns, will call back to schedule 3 month labs. Call back # [phone]

## 2023-01-14 ENCOUNTER — Ambulatory Visit
Admission: RE | Admit: 2023-01-14 | Discharge: 2023-01-14 | Disposition: A | Discharge status: Home / Self Care | Payer: Medicare Other | Source: Ambulatory Visit | Attending: Family Medicine | Admitting: Family Medicine

## 2023-01-14 DIAGNOSIS — Z1231 Encounter for screening mammogram for malignant neoplasm of breast: Secondary | ICD-10-CM

## 2023-01-16 ENCOUNTER — Other Ambulatory Visit: Payer: Self-pay | Admitting: Cardiovascular Disease

## 2023-01-16 ENCOUNTER — Ambulatory Visit (INDEPENDENT_AMBULATORY_CARE_PROVIDER_SITE_OTHER): Payer: Medicare Other | Admitting: Podiatry

## 2023-01-16 VITALS — BP 150/77

## 2023-01-16 DIAGNOSIS — M79674 Pain in right toe(s): Secondary | ICD-10-CM

## 2023-01-16 DIAGNOSIS — M79675 Pain in left toe(s): Secondary | ICD-10-CM

## 2023-01-16 DIAGNOSIS — B351 Tinea unguium: Secondary | ICD-10-CM | POA: Diagnosis not present

## 2023-01-16 NOTE — Progress Notes (Signed)
   Chief Complaint  Patient presents with   Nail Problem    Rfc,Referring Provider Tower, Idamae Schuller A, MD,lov:06/24       SUBJECTIVE Patient presents to office today complaining of elongated, thickened nails that cause pain while ambulating in shoes.  Patient is unable to trim their own nails. Patient is here for further evaluation and treatment.  Past Medical History:  Diagnosis Date   Arthritis    hips, knees   Arthritis    Atrial fibrillation (HCC)    chronic   Back pain    Balance problem    Cardiomegaly    Cataract 2020   Right eye December 06, 2018, Left eye date pending   CHF (congestive heart failure) (HCC) 07/2018   chronic   Hypertension    Osteopenia    Pleural effusion 08/25/2018   [facility]   Shingles 08/2017   H/O   SOB (shortness of breath)     Allergies  Allergen Reactions   Ace Inhibitors Cough        Hydrocodone Bit-Homatrop Mbr Nausea Only   Tramadol Hcl Nausea Only          OBJECTIVE General Patient is awake, alert, and oriented x 3 and in no acute distress. Derm Skin is dry and supple bilateral. Negative open lesions or macerations. Remaining integument unremarkable. Nails are tender, long, thickened and dystrophic with subungual debris, consistent with onychomycosis, 1-5 bilateral. No signs of infection noted. Vasc  DP and PT pedal pulses palpable bilaterally. Temperature gradient within normal limits.  Neuro Epicritic and protective threshold sensation grossly intact bilaterally.  Musculoskeletal Exam No symptomatic pedal deformities noted bilateral. Muscular strength within normal limits.  ASSESSMENT 1.  Pain due to onychomycosis of toenails both  PLAN OF CARE 1. Patient evaluated today.  2. Instructed to maintain good pedal hygiene and foot care.  3. Mechanical debridement of nails 1-5 bilaterally performed using a nail nipper. Filed with dremel without incident.  4. Return to clinic in 3 mos.    Felecia Shelling, DPM [facility]  Dr. Felecia Shelling, DPM    [address] [place], [place] [address]                Office [phone]  Fax [phone]

## 2023-01-21 ENCOUNTER — Telehealth: Payer: Self-pay | Admitting: Family Medicine

## 2023-01-21 NOTE — Telephone Encounter (Signed)
Patient called in to follow up on her mammogram. Relayed message regarding results and informed patient she will have to repeat in a year. Thank you!

## 2023-02-15 ENCOUNTER — Other Ambulatory Visit: Payer: Self-pay | Admitting: Cardiovascular Disease

## 2023-02-24 ENCOUNTER — Ambulatory Visit
Admission: RE | Admit: 2023-02-24 | Discharge: 2023-02-24 | Disposition: A | Discharge status: Home / Self Care | Payer: Medicare Other | Source: Ambulatory Visit | Attending: Family Medicine | Admitting: Family Medicine

## 2023-02-24 DIAGNOSIS — E2839 Other primary ovarian failure: Secondary | ICD-10-CM | POA: Diagnosis present

## 2023-03-06 ENCOUNTER — Other Ambulatory Visit: Payer: Self-pay | Admitting: Cardiovascular Disease

## 2023-03-09 ENCOUNTER — Encounter: Payer: Self-pay | Admitting: Family Medicine

## 2023-03-09 ENCOUNTER — Telehealth: Payer: Self-pay | Admitting: Family Medicine

## 2023-03-09 ENCOUNTER — Telehealth: Payer: Self-pay

## 2023-03-09 NOTE — Telephone Encounter (Signed)
Error

## 2023-03-09 NOTE — Telephone Encounter (Signed)
Patient called in returning call she received. Thank you!

## 2023-03-10 NOTE — Telephone Encounter (Signed)
Calling regarding Bone density scan. Dr. Milinda Antis placed notes saying:   Despite taking fosamax and supplements bone density is down more in hips and forearm I would like to look into a different treatment called prolia (twice yearly injection) Would she be open to this? I have to first check on coverage as it is expensive

## 2023-03-10 NOTE — Telephone Encounter (Signed)
Pt advised of Dr. Royden Purl comments and verbalized understanding. Pt would like info on Prolia mailed to her to review but in the mean time she is okay with [place] having Joellen check the coverage of Prolia and if it's affordable she is willing to get the injections, will route to PCP and Joellen

## 2023-03-10 NOTE — Telephone Encounter (Signed)
I put info to mail her in IN box

## 2023-03-13 NOTE — Telephone Encounter (Signed)
Please verify benefits for patient.

## 2023-03-13 NOTE — Telephone Encounter (Signed)
Information placed in out going mail.

## 2023-03-23 ENCOUNTER — Telehealth: Payer: Self-pay | Admitting: Cardiovascular Disease

## 2023-03-23 MED ORDER — APIXABAN 5 MG PO TABS: 5.0000 mg | ORAL_TABLET | Freq: Two times a day (BID) | ORAL | 3 refills | Status: DC

## 2023-03-23 NOTE — Telephone Encounter (Signed)
Spoke with patient and she reports that her daughter will be coming in to drop off some paperwork for assistance with her Eliquis. Reviewed necessary documents that are needed and she will have her bring those in as well. She was appreciative for the call with no further questions at this time.

## 2023-03-23 NOTE — Telephone Encounter (Signed)
Patient is calling stating her daughter is bring in the [facility] form for Dr. Mariah Milling to fill out and fax around 11 am today.  Please advise.

## 2023-03-24 NOTE — Telephone Encounter (Signed)
Application completed, faxed, and filed.  

## 2023-03-25 ENCOUNTER — Telehealth: Payer: Self-pay

## 2023-03-25 ENCOUNTER — Other Ambulatory Visit (HOSPITAL_COMMUNITY): Payer: Self-pay

## 2023-03-25 NOTE — Telephone Encounter (Signed)
   Ran test claim for PROLIA. Currently a quantity of is a 180 day supply and the co-pay is $434.21.   This test claim was processed through [facility]- copay amounts may vary at other pharmacies due to pharmacy/plan contracts, or as the patient moves through the different stages of their insurance plan.

## 2023-03-25 NOTE — Telephone Encounter (Signed)
Prolia VOB initiated via [email or web address] 

## 2023-03-25 NOTE — Telephone Encounter (Signed)
Prolia BIV in separate encounter. Will route encounter back once benefit verification is complete.

## 2023-03-27 NOTE — Telephone Encounter (Signed)
Pharmacy Patient Advocate Encounter  Received notification from  [employer]  that Prior Authorization for Prolia has been DENIED.  Full denial letter will be uploaded to the media tab. See denial reason below.   PA #/Case ID/Reference #: [id number]   DENIAL REASON: No history of intolerance or contraindication to an injectable bisphosphonate. Nor is it a continuation of therapy.

## 2023-03-29 ENCOUNTER — Telehealth: Payer: Self-pay | Admitting: Family Medicine

## 2023-03-29 DIAGNOSIS — R944 Abnormal results of kidney function studies: Secondary | ICD-10-CM

## 2023-03-29 NOTE — Telephone Encounter (Signed)
-----   Message from Vincenza Hews sent at 03/17/2023  3:35 PM EDT ----- Regarding: Labs Mon. 03/30/23 Hello,   Patient has a lab appointment on Mon. 03/30/23. Can we get lab orders please.   Thanks

## 2023-03-30 ENCOUNTER — Other Ambulatory Visit (INDEPENDENT_AMBULATORY_CARE_PROVIDER_SITE_OTHER): Payer: Medicare Other

## 2023-03-30 DIAGNOSIS — R944 Abnormal results of kidney function studies: Secondary | ICD-10-CM

## 2023-03-30 LAB — BASIC METABOLIC PANEL
BUN: 17 mg/dL (ref 6–23)
CO2: 33 meq/L — ABNORMAL HIGH (ref 19–32)
Calcium: 9.6 mg/dL (ref 8.4–10.5)
Chloride: 100 meq/L (ref 96–112)
Creatinine, Ser: 1.15 mg/dL (ref 0.40–1.20)
GFR: 43.6 mL/min — ABNORMAL LOW (ref >60.00)
Glucose, Bld: 129 mg/dL — ABNORMAL HIGH (ref 70–99)
Potassium: 4.3 meq/L (ref 3.5–5.1)
Sodium: 140 meq/L (ref 135–145)

## 2023-04-02 NOTE — Telephone Encounter (Signed)
Can we resubmit to insurance for coverage under medical so we can see what her copay will be that way?

## 2023-04-06 ENCOUNTER — Ambulatory Visit (INDEPENDENT_AMBULATORY_CARE_PROVIDER_SITE_OTHER): Payer: Medicare Other | Admitting: Family Medicine

## 2023-04-06 ENCOUNTER — Encounter: Payer: Self-pay | Admitting: Family Medicine

## 2023-04-06 VITALS — BP 131/80 | HR 92 | Temp 97.7°F | Ht 62.75 in | Wt 163.4 lb

## 2023-04-06 DIAGNOSIS — R944 Abnormal results of kidney function studies: Secondary | ICD-10-CM | POA: Diagnosis not present

## 2023-04-06 DIAGNOSIS — I10 Essential (primary) hypertension: Secondary | ICD-10-CM | POA: Diagnosis not present

## 2023-04-06 DIAGNOSIS — R8290 Unspecified abnormal findings in urine: Secondary | ICD-10-CM | POA: Diagnosis not present

## 2023-04-06 LAB — POC URINALSYSI DIPSTICK (AUTOMATED)
Bilirubin, UA: NEGATIVE
Blood, UA: 50 — AB
Glucose, UA: NEGATIVE
Ketones, UA: NEGATIVE
Nitrite, UA: NEGATIVE
Protein, UA: NEGATIVE
Spec Grav, UA: 1.015 (ref 1.010–1.025)
Urobilinogen, UA: 1 U/dL
pH, UA: 6 (ref 5.0–8.0)

## 2023-04-06 LAB — POCT UA - MICROSCOPIC ONLY

## 2023-04-06 NOTE — Telephone Encounter (Signed)
Medical PA denied due to no history of intolerance or contraindication to an injectable bisphosphonate. Patient can use pharmacy benefits for the first time then we can resubmit the PA for medical coverage.   Medical copay would be $350 (20% OOP+ $30 admin fee) after it gets approved.

## 2023-04-06 NOTE — Assessment & Plan Note (Signed)
bp in fair control at this time  BP Readings from Last 1 Encounters:  04/06/23 131/80   No changes needed Most recent labs reviewed  Disc lifstyle change with low sodium diet and exercise  Under card care Plan to continue Cardizem 120 mg daily  Cardura 4 mg bid Lasix 20 mg daily Metoprolol xl 25 mg daily Losartan 100 mg daily  Also under cardiology care  Watching GFR

## 2023-04-06 NOTE — Patient Instructions (Addendum)
I want to check a urinalysis to make sure you do not have a uti without symptoms   I want to continue to watch your kidney function with time I believe age, blood pressure and some medicines do affect this   Take care of yourself  Keep up a good water intake

## 2023-04-06 NOTE — Progress Notes (Signed)
Subjective:    Patient ID: Linda Johnson, female    DOB: Aug 25, 1935, 87 y.o.   MRN: [id number]  HPI  Wt Readings from Last 3 Encounters:  04/06/23 163 lb 6 oz (74.1 kg)  12/23/22 167 lb 6.4 oz (75.9 kg)  12/16/22 173 lb (78.5 kg)   29.17 kg/m  Vitals:   04/06/23 1145 04/06/23 1221  BP: (!) 146/90 131/80  Pulse: 92   Temp: 97.7 F (36.5 C)   SpO2: 96%      Pt presents for follow up of reduced GFR   3 y ago GFR 52.5 2 y ago 50.0 1 y ago 48.5 Now  43.6    Lab Results  Component Value Date   NA 140 03/30/2023   K 4.3 03/30/2023   CO2 33 (H) 03/30/2023   GLUCOSE 129 (H) 03/30/2023   BUN 17 03/30/2023   CREATININE 1.15 03/30/2023   CALCIUM 9.6 03/30/2023   GFR 43.60 (L) 03/30/2023   EGFR 59 (L) 09/23/2021   GFRNONAA 45 (L) 08/26/2018   Lab Results  Component Value Date   WBC 5.8 12/23/2022   HGB 13.6 12/23/2022   HCT 41.4 12/23/2022   MCV 93.4 12/23/2022   PLT 202.0 12/23/2022   HTN bp is stable today  No cp or palpitations or headaches or edema  No side effects to medicines  BP Readings from Last 3 Encounters:  04/06/23 131/80  01/16/23 (!) 150/77  12/23/22 130/72    Pulse Readings from Last 3 Encounters:  04/06/23 92  12/23/22 95  03/26/22 90   Taking Cardizem 120 mg daily  Cardura 4 mg bid Lasix 20 mg daily Metoprolol xl 25 mg daily Losartan 100 mg daily  Also under cardiology care   Urinalysis today  Results for orders placed or performed in visit on 04/06/23  POCT Urinalysis Dipstick (Automated)  Result Value Ref Range   Color, UA Yellow    Clarity, UA Clear    Glucose, UA Negative Negative   Bilirubin, UA Negative    Ketones, UA Negative    Spec Grav, UA 1.015 1.010 - 1.025   Blood, UA 50 Ery/uL (A)    pH, UA 6.0 5.0 - 8.0   Protein, UA Negative Negative   Urobilinogen, UA 1.0 0.2 or 1.0 E.U./dL   Nitrite, UA Negative    Leukocytes, UA Moderate (2+) (A) Negative  POCT UA - Microscopic Only  Result Value Ref Range   WBC,  Ur, HPF, POC 3-8 0 - 5   RBC, Urine, Miroscopic none 0 - 2   Bacteria, U Microscopic few None - Trace   Mucus, UA mod    Epithelial cells, urine per micros mod    Crystals, Ur, HPF, POC none    Casts, Ur, LPF, POC none    Yeast, UA none      Also takes fosamax weekly (can continue this as long as GFR is over 35   Fluid intake - is all water  She sips from a cup all day   At least 40-60 oz per day     Lab Results  Component Value Date   HGBA1C 5.6 12/23/2022       Patient Active Problem List   Diagnosis Date Noted   Enlarged and hypertrophic nails 12/23/2022   Decreased GFR 12/23/2022   History of COVID-19 11/23/2020   Chronic diastolic CHF (congestive heart failure) (HCC) 08/30/2018   Pedal edema 08/23/2018   PVC (premature ventricular contraction) 01/18/2018   Poor  balance 10/05/2017   Post herpetic neuralgia 09/23/2017   Encounter for anticoagulation discussion and counseling 09/20/2015   Irregular heart rate 09/17/2015   Permanent atrial fibrillation (HCC) 09/17/2015   Hearing loss 09/17/2015   Hypothyroidism 12/12/2014   Prediabetes 09/11/2014   Encounter for Medicare annual wellness exam 09/11/2014   Estrogen deficiency 09/11/2014   Routine general medical examination at a health care facility 09/09/2013   Low back pain 03/21/2013   Other screening mammogram 04/19/2012   Post-menopausal 04/19/2012   Obesity 08/18/2011   Hyperlipidemia 06/12/2010   Osteoporosis 06/12/2010   Allergic rhinitis 04/12/2008   History of colonic polyps 04/02/2007   Disorder of eye movements 03/02/2007   Essential hypertension 03/02/2007   Past Medical History:  Diagnosis Date   Arthritis    hips, knees   Arthritis    Atrial fibrillation (HCC)    chronic   Back pain    Balance problem    Cardiomegaly    Cataract 2020   Right eye December 06, 2018, Left eye date pending   CHF (congestive heart failure) (HCC) 07/2018   chronic   Hypertension    Osteopenia    Pleural  effusion 08/25/2018   [facility]   Shingles 08/2017   H/O   SOB (shortness of breath)    Past Surgical History:  Procedure Laterality Date   BREAST BIOPSY Left 2011   2 areas, cysts per pt   CATARACT EXTRACTION W/PHACO Right 12/06/2018   Procedure: CATARACT EXTRACTION PHACO AND INTRAOCULAR LENS PLACEMENT (IOC) RIGHT;  Surgeon: Nevada Crane, MD;  Location: [facility];  Service: Ophthalmology;  Laterality: Right;   CATARACT EXTRACTION W/PHACO Left 01/17/2019   Procedure: CATARACT EXTRACTION PHACO AND INTRAOCULAR LENS PLACEMENT (IOC) LEFT;  Surgeon: Nevada Crane, MD;  Location: [facility];  Service: Ophthalmology;  Laterality: Left;   Social History   Tobacco Use   Smoking status: Never   Smokeless tobacco: Never  Vaping Use   Vaping status: Never Used  Substance Use Topics   Alcohol use: No    Alcohol/week: 0.0 standard drinks of alcohol   Drug use: No   Family History  Problem Relation Age of Onset   Leukemia Sister    Breast cancer Sister 14   Breast cancer Maternal Aunt 50   Allergies  Allergen Reactions   Ace Inhibitors Cough        Hydrocodone Bit-Homatrop Mbr Nausea Only   Tramadol Hcl Nausea Only        Current Outpatient Medications on File Prior to Visit  Medication Sig Dispense Refill   alendronate (FOSAMAX) 70 MG tablet TAKE 1 TABLET (70 MG TOTAL) EVERY 7 DAYS. TAKE WITH A FULL GLASS OF WATER ON AN EMPTY STOMACH. 12 tablet 3   apixaban (ELIQUIS) 5 MG TABS tablet Take 1 tablet (5 mg total) by mouth 2 (two) times daily. 180 tablet 3   diltiazem (CARDIZEM CD) 120 MG 24 hr capsule TAKE 1 CAPSULE BY MOUTH EVERY DAY 90 capsule 3   doxazosin (CARDURA) 8 MG tablet TAKE 1/2 TABLET (4 MG TOTAL) BY MOUTH 2 (TWO) TIMES DAILY. 30 tablet 5   furosemide (LASIX) 20 MG tablet TAKE 1 TABLET(20 MG) BY MOUTH ONCE DAILY FOR WEIGHT GREATER THEN 173 LBS. PLEASE CALL [phone] TO SCHEDULE A YEARLY FOLLOW UP APPOINTMENT AND TO RECEIVE FURTHER REFILLS. THANK YOU.  90 tablet 0   latanoprost (XALATAN) 0.005 % ophthalmic solution INSTILL ONE DROP IN BOTH EYES AT BEDTIME.  3   losartan (  COZAAR) 100 MG tablet TAKE 1 TABLET BY MOUTH EVERY DAY 90 tablet 0   metoprolol succinate (TOPROL-XL) 25 MG 24 hr tablet TAKE 1 TABLET (25 MG TOTAL) BY MOUTH DAILY. 90 tablet 0   potassium chloride (KLOR-CON) 10 MEQ tablet TAKE 1 TABLET BY MOUTH EVERY DAY 90 tablet 0   No current facility-administered medications on file prior to visit.    Review of Systems  Constitutional:  Negative for activity change, appetite change, fatigue, fever and unexpected weight change.  HENT:  Negative for congestion, ear pain, rhinorrhea, sinus pressure and sore throat.   Eyes:  Negative for pain, redness and visual disturbance.  Respiratory:  Negative for cough, shortness of breath and wheezing.   Cardiovascular:  Negative for chest pain and palpitations.  Gastrointestinal:  Negative for abdominal pain, blood in stool, constipation and diarrhea.  Endocrine: Negative for polydipsia and polyuria.  Genitourinary:  Negative for dysuria, frequency and urgency.  Musculoskeletal:  Negative for arthralgias, back pain and myalgias.  Skin:  Negative for pallor and rash.  Allergic/Immunologic: Negative for environmental allergies.  Neurological:  Negative for dizziness, syncope and headaches.  Hematological:  Negative for adenopathy. Does not bruise/bleed easily.  Psychiatric/Behavioral:  Negative for decreased concentration and dysphoric mood. The patient is not nervous/anxious.        Objective:   Physical Exam Constitutional:      General: She is not in acute distress.    Appearance: Normal appearance. She is well-developed. She is not ill-appearing or diaphoretic.     Comments: overwt  HENT:     Head: Normocephalic and atraumatic.  Eyes:     Conjunctiva/sclera: Conjunctivae normal.     Pupils: Pupils are equal, round, and reactive to light.     Comments: Strabismus noted, baseline   Neck:     Thyroid: No thyromegaly.     Vascular: No carotid bruit or JVD.  Cardiovascular:     Rate and Rhythm: Normal rate and regular rhythm.     Heart sounds: Normal heart sounds.     No gallop.  Pulmonary:     Effort: Pulmonary effort is normal. No respiratory distress.     Breath sounds: Normal breath sounds. No wheezing or rales.  Abdominal:     General: There is no distension or abdominal bruit.     Palpations: Abdomen is soft.     Tenderness: There is no abdominal tenderness. There is no right CVA tenderness, left CVA tenderness, guarding or rebound.     Comments: No suprapubic tenderness or fullness    Musculoskeletal:     Cervical back: Normal range of motion and neck supple.     Right lower leg: No edema.     Left lower leg: No edema.  Lymphadenopathy:     Cervical: No cervical adenopathy.  Skin:    General: Skin is warm and dry.     Coloration: Skin is not jaundiced or pale.     Findings: No bruising or rash.  Neurological:     Mental Status: She is alert.     Coordination: Coordination normal.     Deep Tendon Reflexes: Reflexes are normal and symmetric. Reflexes normal.  Psychiatric:        Mood and Affect: Mood normal.           Assessment & Plan:   Problem List Items Addressed This Visit       Cardiovascular and Mediastinum   Essential hypertension    bp in fair control at this  time  BP Readings from Last 1 Encounters:  04/06/23 131/80   No changes needed Most recent labs reviewed  Disc lifstyle change with low sodium diet and exercise  Under card care Plan to continue Cardizem 120 mg daily  Cardura 4 mg bid Lasix 20 mg daily Metoprolol xl 25 mg daily Losartan 100 mg daily  Also under cardiology care  Watching GFR         Other   Decreased GFR - Primary    GFR has been going down gradually with age  Now 43.6 Urinalysis today notes leukocytes and culture pending  Blood pressure is better on 2nd check  On losartan  Also lasix    Per pt no fluid restriction and she consumes a fair amt of water  Discussed renal health  Handout given  Willl treat for uti if culture is positive Otherwise re check in 3 months        Relevant Orders   POCT Urinalysis Dipstick (Automated) (Completed)   Urine Culture   Other Visit Diagnoses     Abnormal urinalysis       Relevant Orders   Urine Culture

## 2023-04-06 NOTE — Assessment & Plan Note (Addendum)
GFR has been going down gradually with age  Now 62.6 Urinalysis today notes leukocytes and culture pending  Blood pressure is better on 2nd check  On losartan  Also lasix   Per pt no fluid restriction and she consumes a fair amt of water  Discussed renal health  Handout given  Willl treat for uti if culture is positive Otherwise re check in 3 months

## 2023-04-07 LAB — URINE CULTURE
MICRO NUMBER:: 15560798
Result:: NO GROWTH
SPECIMEN QUALITY:: ADEQUATE

## 2023-04-09 ENCOUNTER — Telehealth: Payer: Self-pay | Admitting: *Deleted

## 2023-04-09 NOTE — Telephone Encounter (Signed)
Called pt to f/u with labs (addressed).   Pt wanted me to let PCP and Joellen know she received at letter saying her insurance denied our request to cover Prolia so she said she would just stay on the fosamax for now and she can discuss this with PCP at next appt

## 2023-04-09 NOTE — Telephone Encounter (Signed)
Aware, that is unfortunate  Will discuss at next visit

## 2023-04-13 NOTE — Telephone Encounter (Signed)
Thanks for letting me know  She is able to take oral alendronate and I doubt injectable would work any better  It was worth a try   Will discuss at next visit -we will discuss further then

## 2023-04-15 ENCOUNTER — Other Ambulatory Visit: Payer: Self-pay | Admitting: Cardiovascular Disease

## 2023-04-17 ENCOUNTER — Ambulatory Visit: Payer: Medicare Other | Admitting: Podiatry

## 2023-04-17 DIAGNOSIS — M79674 Pain in right toe(s): Secondary | ICD-10-CM | POA: Diagnosis not present

## 2023-04-17 DIAGNOSIS — M79675 Pain in left toe(s): Secondary | ICD-10-CM | POA: Diagnosis not present

## 2023-04-17 DIAGNOSIS — B351 Tinea unguium: Secondary | ICD-10-CM

## 2023-04-17 NOTE — Progress Notes (Signed)
   No chief complaint on file.   SUBJECTIVE Patient presents to office today complaining of elongated, thickened nails that cause pain while ambulating in shoes.  Patient is unable to trim their own nails. Patient is here for further evaluation and treatment.  Past Medical History:  Diagnosis Date   Arthritis    hips, knees   Arthritis    Atrial fibrillation (HCC)    chronic   Back pain    Balance problem    Cardiomegaly    Cataract 2020   Right eye December 06, 2018, Left eye date pending   CHF (congestive heart failure) (HCC) 07/2018   chronic   Hypertension    Osteopenia    Pleural effusion 08/25/2018   [facility]   Shingles 08/2017   H/O   SOB (shortness of breath)     Allergies  Allergen Reactions   Ace Inhibitors Cough        Hydrocodone Bit-Homatrop Mbr Nausea Only   Tramadol Hcl Nausea Only          OBJECTIVE General Patient is awake, alert, and oriented x 3 and in no acute distress. Derm Skin is dry and supple bilateral. Negative open lesions or macerations. Remaining integument unremarkable. Nails are tender, long, thickened and dystrophic with subungual debris, consistent with onychomycosis, 1-5 bilateral. No signs of infection noted. Vasc  DP and PT pedal pulses palpable bilaterally. Temperature gradient within normal limits.  Neuro Epicritic and protective threshold sensation grossly intact bilaterally.  Musculoskeletal Exam No symptomatic pedal deformities noted bilateral. Muscular strength within normal limits.  ASSESSMENT 1.  Pain due to onychomycosis of toenails both  PLAN OF CARE 1. Patient evaluated today.  2. Instructed to maintain good pedal hygiene and foot care.  3. Mechanical debridement of nails 1-5 bilaterally performed using a nail nipper. Filed with dremel without incident.  4. Return to clinic in 3 mos.    Felecia Shelling, DPM [facility]  Dr. Felecia Shelling, DPM    [address] [place], [place] [address]                Office [phone]  Fax [phone]

## 2023-04-18 NOTE — Progress Notes (Unsigned)
Cardiology Office Note  Date:  04/20/2023   ID:  Linda Johnson, DOB 1936/01/05, MRN [id number]  PCP:  Judy Pimple, MD   Chief Complaint  Patient presents with   12 month follow up     "Doing well." Medications reviewed by the patient verbally.     HPI:  Linda Johnson is a 88 y.o. woman with a hx of  Permanent atrial fibrillation,since 2017 previously declined cardioversion On anticoagulation/eliquis hypertension who presents for routine follow-up of her  PVCs, atrial fibrillation  LOV 9/23 In follow-up today she reports doing well Denies significant chest pain or leg swelling Chronic mild shortness of breath on exertion Still driving, just renewed her license Likes to drive and visit daughter, Doctor visits Reports she does not like going to the shopping stores, groceries Uses a walker, no recent falls No regular exercise program Sedentary baseline Denies any tachypalpitations No bleeding  Other past medical history reviewed Covid in may 2022  Prior history of supratherapeutic INR of 7 on warfarin Changed to Eliquis  Previously felt she had nausea from the low-dose amiodarone  Atrial fibrillation picked up by primary care on routine EKG early 2017    PMH:   has a past medical history of Arthritis, Arthritis, Atrial fibrillation (HCC), Back pain, Balance problem, Cardiomegaly, Cataract (2020), CHF (congestive heart failure) (HCC) (07/2018), Hypertension, Osteopenia, Pleural effusion (08/25/2018), Shingles (08/2017), and SOB (shortness of breath).  PSH:    Past Surgical History:  Procedure Laterality Date   BREAST BIOPSY Left 2011   2 areas, cysts per pt   CATARACT EXTRACTION W/PHACO Right 12/06/2018   Procedure: CATARACT EXTRACTION PHACO AND INTRAOCULAR LENS PLACEMENT (IOC) RIGHT;  Surgeon: Nevada Crane, MD;  Location: [facility];  Service: Ophthalmology;  Laterality: Right;   CATARACT EXTRACTION W/PHACO Left 01/17/2019   Procedure: CATARACT EXTRACTION  PHACO AND INTRAOCULAR LENS PLACEMENT (IOC) LEFT;  Surgeon: Nevada Crane, MD;  Location: [facility];  Service: Ophthalmology;  Laterality: Left;    Current Outpatient Medications  Medication Sig Dispense Refill   alendronate (FOSAMAX) 70 MG tablet TAKE 1 TABLET (70 MG TOTAL) EVERY 7 DAYS. TAKE WITH A FULL GLASS OF WATER ON AN EMPTY STOMACH. 12 tablet 3   apixaban (ELIQUIS) 5 MG TABS tablet Take 1 tablet (5 mg total) by mouth 2 (two) times daily. 180 tablet 3   diltiazem (CARDIZEM CD) 120 MG 24 hr capsule TAKE 1 CAPSULE BY MOUTH EVERY DAY 90 capsule 3   doxazosin (CARDURA) 8 MG tablet TAKE 1/2 TABLET (4 MG TOTAL) BY MOUTH 2 (TWO) TIMES DAILY. 30 tablet 5   furosemide (LASIX) 20 MG tablet Take 20 mg by mouth daily.     latanoprost (XALATAN) 0.005 % ophthalmic solution INSTILL ONE DROP IN BOTH EYES AT BEDTIME.  3   losartan (COZAAR) 100 MG tablet TAKE 1 TABLET BY MOUTH EVERY DAY 90 tablet 0   metoprolol succinate (TOPROL-XL) 25 MG 24 hr tablet TAKE 1 TABLET (25 MG TOTAL) BY MOUTH DAILY. 90 tablet 0   potassium chloride (KLOR-CON) 10 MEQ tablet TAKE 1 TABLET BY MOUTH EVERY DAY 90 tablet 0   No current facility-administered medications for this visit.    Allergies:   Ace inhibitors, Hydrocodone bit-homatrop mbr, and Tramadol hcl   Social History:  The patient  reports that she has never smoked. She has never used smokeless tobacco. She reports that she does not drink alcohol and does not use drugs.   Family History:  family history includes Breast cancer (age of onset: 6) in her maternal aunt; Breast cancer (age of onset: 72) in her sister; Leukemia in her sister.    Review of Systems: Review of Systems  HENT: Negative.    Eyes: Negative.   Respiratory: Negative.    Cardiovascular: Negative.   Gastrointestinal: Negative.   Genitourinary: Negative.   Musculoskeletal: Negative.   Neurological: Negative.   Psychiatric/Behavioral: Negative.    All other systems reviewed and  are negative.   PHYSICAL EXAM: VS:  BP (!) 120/56 (BP Location: Left Arm, Patient Position: Sitting, Cuff Size: Normal)   Pulse 79   Ht 5\' 3"  (1.6 m)   Wt 171 lb 2 oz (77.6 kg)   LMP  (LMP Unknown)   SpO2 97%   BMI 30.31 kg/m  , BMI Body mass index is 30.31 kg/m.  Constitutional:  oriented to person, place, and time. No distress.  HENT:  Head: Grossly normal Eyes:  no discharge. No scleral icterus.  Neck: No JVD, no carotid bruits  Cardiovascular: Regular rate and rhythm, no murmurs appreciated Pulmonary/Chest: Clear to auscultation bilaterally, no wheezes or rails Abdominal: Soft.  no distension.  no tenderness.  Musculoskeletal: Normal range of motion Neurological:  normal muscle tone. Coordination normal. No atrophy Skin: Skin warm and dry Psychiatric: normal affect, pleasant  Recent Labs: 12/23/2022: ALT 7; Hemoglobin 13.6; Platelets 202.0; TSH 4.54 03/30/2023: BUN 17; Creatinine, Ser 1.15; Potassium 4.3; Sodium 140    Lipid Panel Lab Results  Component Value Date   CHOL 133 12/23/2022   HDL 41.50 12/23/2022   LDLCALC 74 12/23/2022   TRIG 88.0 12/23/2022      Wt Readings from Last 3 Encounters:  04/20/23 171 lb 2 oz (77.6 kg)  04/06/23 163 lb 6 oz (74.1 kg)  12/23/22 167 lb 6.4 oz (75.9 kg)     ASSESSMENT AND PLAN:  Chronic diastolic CHF Chronic shortness of breath, symptoms stable Recommend she continue Lasix daily, BMP stable Deconditioned, sedentary likely contributing  Permanent atrial fibrillation (HCC)  Tolerating Eliquis 5 mg twice a day , Rate control with diltiazem 120 daily No changes made  Essential hypertension Blood pressure is well controlled on today's visit. No changes made to the medications.  Gait instability Walks with a walker, no recent falls Sedentary, no exercise program Will monitor closely as she is on anticoagulation  PVCs Not having any symptoms Rare PVCs by history    Signed, Dossie Arbour, M.D., Ph.D. 04/20/2023   [facility] [place], [place] [phone]

## 2023-04-20 ENCOUNTER — Ambulatory Visit: Payer: Medicare Other | Attending: Cardiovascular Disease | Admitting: Cardiovascular Disease

## 2023-04-20 ENCOUNTER — Encounter: Payer: Self-pay | Admitting: Cardiovascular Disease

## 2023-04-20 VITALS — BP 120/56 | HR 79 | Ht 63.0 in | Wt 171.1 lb

## 2023-04-20 DIAGNOSIS — I5032 Chronic diastolic (congestive) heart failure: Secondary | ICD-10-CM

## 2023-04-20 DIAGNOSIS — I482 Chronic atrial fibrillation, unspecified: Secondary | ICD-10-CM

## 2023-04-20 DIAGNOSIS — I10 Essential (primary) hypertension: Secondary | ICD-10-CM

## 2023-04-20 DIAGNOSIS — E782 Mixed hyperlipidemia: Secondary | ICD-10-CM | POA: Diagnosis not present

## 2023-04-20 DIAGNOSIS — I493 Ventricular premature depolarization: Secondary | ICD-10-CM | POA: Diagnosis not present

## 2023-04-20 NOTE — Patient Instructions (Signed)

## 2023-04-28 ENCOUNTER — Telehealth: Payer: Self-pay | Admitting: Family Medicine

## 2023-04-28 DIAGNOSIS — M810 Age-related osteoporosis without current pathological fracture: Secondary | ICD-10-CM

## 2023-04-28 NOTE — Telephone Encounter (Signed)
Will route to Amy 

## 2023-04-28 NOTE — Telephone Encounter (Signed)
Pt called stating she had a scan done at [facility] on 10/16 was told by Insurance that the scan wouldn't be covered due to coding issues. Pt stated she contacted our office last Fri, 10/26 & was told someone in our office would handle issue for her. I didn't see any notations regarding coding issues. Pt states she contacted [facility] as well & was told by that office, that [facility] was the only ones who could resolve issue. Please advise. Call back # [phone]

## 2023-04-29 NOTE — Addendum Note (Signed)
Addended by: [name] A on: 04/29/2023 01:10 PM   Modules accepted: Orders

## 2023-04-29 NOTE — Telephone Encounter (Signed)
I sent a new order

## 2023-04-29 NOTE — Telephone Encounter (Signed)
Per Dawn-Primary diagnosis should be M81.0, Once the order is updated let me know, I will need to call over to [facility] to get this corrected.

## 2023-05-17 ENCOUNTER — Other Ambulatory Visit: Payer: Self-pay | Admitting: Cardiovascular Disease

## 2023-06-03 ENCOUNTER — Other Ambulatory Visit: Payer: Self-pay | Admitting: Cardiovascular Disease

## 2023-07-08 ENCOUNTER — Telehealth: Payer: Self-pay | Admitting: Family Medicine

## 2023-07-08 DIAGNOSIS — R7303 Prediabetes: Secondary | ICD-10-CM

## 2023-07-08 DIAGNOSIS — I10 Essential (primary) hypertension: Secondary | ICD-10-CM

## 2023-07-08 DIAGNOSIS — R944 Abnormal results of kidney function studies: Secondary | ICD-10-CM

## 2023-07-08 NOTE — Telephone Encounter (Signed)
-----   Message from Alvina Chou sent at 06/25/2023  3:05 PM EST ----- Regarding: Lab order for Linda Johnson, 1.9.25 Lab orders for a 3 month follow up appt.

## 2023-07-09 ENCOUNTER — Other Ambulatory Visit: Payer: Medicare Other

## 2023-07-12 ENCOUNTER — Other Ambulatory Visit: Payer: Self-pay | Admitting: Cardiovascular Disease

## 2023-07-13 ENCOUNTER — Telehealth: Payer: Self-pay | Admitting: Family Medicine

## 2023-07-13 ENCOUNTER — Other Ambulatory Visit: Payer: Self-pay | Admitting: Cardiovascular Disease

## 2023-07-13 NOTE — Telephone Encounter (Signed)
-----   Message from Lovena Neighbours sent at 07/07/2023  2:12 PM EST ----- Regarding: Labs for Thursday 1.16.25 Please put lab orders in future. Thank you, Denny Peon

## 2023-07-13 NOTE — Telephone Encounter (Signed)
 Last office visit: 04/20/23--Recommend she continue Lasix daily-- with plan to f/u 12 months. next office visit: none/active recall

## 2023-07-16 ENCOUNTER — Other Ambulatory Visit (INDEPENDENT_AMBULATORY_CARE_PROVIDER_SITE_OTHER): Payer: Medicare Other

## 2023-07-16 DIAGNOSIS — R7303 Prediabetes: Secondary | ICD-10-CM | POA: Diagnosis not present

## 2023-07-16 DIAGNOSIS — R944 Abnormal results of kidney function studies: Secondary | ICD-10-CM | POA: Diagnosis not present

## 2023-07-16 DIAGNOSIS — I10 Essential (primary) hypertension: Secondary | ICD-10-CM | POA: Diagnosis not present

## 2023-07-16 LAB — BASIC METABOLIC PANEL
BUN: 27 mg/dL — ABNORMAL HIGH (ref 6–23)
CO2: 34 meq/L — ABNORMAL HIGH (ref 19–32)
Calcium: 9.7 mg/dL (ref 8.4–10.5)
Chloride: 99 meq/L (ref 96–112)
Creatinine, Ser: 1.19 mg/dL (ref 0.40–1.20)
GFR: 41.76 mL/min — ABNORMAL LOW (ref >60.00)
Glucose, Bld: 160 mg/dL — ABNORMAL HIGH (ref 70–99)
Potassium: 4.2 meq/L (ref 3.5–5.1)
Sodium: 142 meq/L (ref 135–145)

## 2023-07-16 LAB — HEMOGLOBIN A1C: Hgb A1c MFr Bld: 5.7 % (ref 4.6–6.5)

## 2023-07-17 ENCOUNTER — Encounter: Payer: Self-pay | Admitting: Family Medicine

## 2023-07-19 NOTE — Addendum Note (Signed)
Addended by: [name] A on: 07/19/2023 11:32 AM   Modules accepted: Orders

## 2023-07-31 ENCOUNTER — Ambulatory Visit: Payer: Medicare Other | Admitting: Podiatry

## 2023-07-31 DIAGNOSIS — M79675 Pain in left toe(s): Secondary | ICD-10-CM

## 2023-07-31 DIAGNOSIS — M79674 Pain in right toe(s): Secondary | ICD-10-CM | POA: Diagnosis not present

## 2023-07-31 DIAGNOSIS — B351 Tinea unguium: Secondary | ICD-10-CM

## 2023-07-31 NOTE — Progress Notes (Signed)
   No chief complaint on file.   SUBJECTIVE Patient presents to office today complaining of elongated, thickened nails that cause pain while ambulating in shoes.  Patient is unable to trim their own nails. Patient is here for further evaluation and treatment.  Past Medical History:  Diagnosis Date   Arthritis    hips, knees   Arthritis    Atrial fibrillation (HCC)    chronic   Back pain    Balance problem    Cardiomegaly    Cataract 2020   Right eye December 06, 2018, Left eye date pending   CHF (congestive heart failure) (HCC) 07/2018   chronic   Hypertension    Osteopenia    Pleural effusion 08/25/2018   [facility]   Shingles 08/2017   H/O   SOB (shortness of breath)     Allergies  Allergen Reactions   Ace Inhibitors Cough        Hydrocodone Bit-Homatrop Mbr Nausea Only   Tramadol Hcl Nausea Only          OBJECTIVE General Patient is awake, alert, and oriented x 3 and in no acute distress. Derm Skin is dry and supple bilateral. Negative open lesions or macerations. Remaining integument unremarkable. Nails are tender, long, thickened and dystrophic with subungual debris, consistent with onychomycosis, 1-5 bilateral. No signs of infection noted. Vasc  DP and PT pedal pulses palpable bilaterally. Temperature gradient within normal limits.  Neuro Epicritic and protective threshold sensation grossly intact bilaterally.  Musculoskeletal Exam No symptomatic pedal deformities noted bilateral. Muscular strength within normal limits.  ASSESSMENT 1.  Pain due to onychomycosis of toenails both  PLAN OF CARE 1. Patient evaluated today.  2. Instructed to maintain good pedal hygiene and foot care.  3. Mechanical debridement of nails 1-5 bilaterally performed using a nail nipper. Filed with dremel without incident.  4. Return to clinic in 3 mos.    Felecia Shelling, DPM [facility]  Dr. Felecia Shelling, DPM    [address] [place], [place] [address]                Office [phone]  Fax [phone]

## 2023-08-10 ENCOUNTER — Encounter: Payer: Self-pay | Admitting: Family Medicine

## 2023-08-10 ENCOUNTER — Other Ambulatory Visit (INDEPENDENT_AMBULATORY_CARE_PROVIDER_SITE_OTHER): Payer: Medicare Other

## 2023-08-10 DIAGNOSIS — R944 Abnormal results of kidney function studies: Secondary | ICD-10-CM

## 2023-08-10 DIAGNOSIS — I10 Essential (primary) hypertension: Secondary | ICD-10-CM

## 2023-08-10 LAB — BASIC METABOLIC PANEL
BUN: 22 mg/dL (ref 6–23)
CO2: 32 meq/L (ref 19–32)
Calcium: 9.4 mg/dL (ref 8.4–10.5)
Chloride: 97 meq/L (ref 96–112)
Creatinine, Ser: 1.07 mg/dL (ref 0.40–1.20)
GFR: 47.42 mL/min — ABNORMAL LOW (ref >60.00)
Glucose, Bld: 126 mg/dL — ABNORMAL HIGH (ref 70–99)
Potassium: 4 meq/L (ref 3.5–5.1)
Sodium: 141 meq/L (ref 135–145)

## 2023-10-08 ENCOUNTER — Telehealth: Payer: Self-pay | Admitting: Cardiovascular Disease

## 2023-10-08 DIAGNOSIS — I482 Chronic atrial fibrillation, unspecified: Secondary | ICD-10-CM

## 2023-10-08 MED ORDER — APIXABAN 5 MG PO TABS
5.0000 mg | ORAL_TABLET | Freq: Two times a day (BID) | ORAL | 1 refills | Status: DC
Start: 2023-10-08 — End: 2024-03-04

## 2023-10-08 NOTE — Telephone Encounter (Signed)
*  STAT* If patient is at the pharmacy, call can be transferred to refill team.   1. Which medications need to be refilled? (please list name of each medication and dose if known) apixaban (ELIQUIS) 5 MG TABS tablet   2. Which pharmacy/location (including street and city if local pharmacy) is medication to be sent to? [id number] - [place], [place] - [address] [phone]   3. Do they need a 30 day or 90 day supply? 90 Pt not sure if she rec's pt assist or not, can you please check on this

## 2023-10-08 NOTE — Telephone Encounter (Signed)
 Prescription refill request for Eliquis received. Indication: Afib  Last office visit: 04/20/23 Linda Johnson)  Scr: 1.07 (08/10/23)  Age: 88 Weight: 77.6kg  Appropriate dose. Refill sent.

## 2023-10-09 ENCOUNTER — Telehealth: Payer: Self-pay | Admitting: Pharmacy Technician

## 2023-10-09 ENCOUNTER — Other Ambulatory Visit (HOSPITAL_COMMUNITY): Payer: Self-pay

## 2023-10-09 NOTE — Telephone Encounter (Signed)
 PAP: Patient assistance application for Eliquis through [employer] ([employer]) has been mailed to pt's home address on file. Provider portion of application will be faxed to provider's office.

## 2023-10-13 ENCOUNTER — Telehealth: Payer: Self-pay | Admitting: Cardiovascular Disease

## 2023-10-13 NOTE — Telephone Encounter (Signed)
 Pt c/o medication issue:  1. Name of Medication: Eliquis  2. How are you currently taking this medication (dosage and times per day)?   3. Are you having a reaction (difficulty breathing--STAT)?   4. What is your medication issue? Patient wants to know if she can get some assistance with her Eliquis

## 2023-10-14 NOTE — Telephone Encounter (Signed)
 YouJust now (9:26 AM)    Application had been mailed to the patient 10/09/23 and a mychart had been sent out at that time as well. I called the pt and left a message       Note   Linda Johnson routed conversation to Rx Med Assistance Team16 hours ago (4:28 PM)   [name] M16 hours ago (4:28 PM)   SW Pt c/o medication issue:   1. Name of Medication: Eliquis   2. How are you currently taking this medication (dosage and times per day)?    3. Are you having a reaction (difficulty breathing--STAT)?    4. What is your medication issue? Patient wants to know if she can get some assistance with her Eliquis

## 2023-10-14 NOTE — Telephone Encounter (Signed)
 Application had been mailed to the patient 10/09/23 and a mychart had been sent out at that time as well. I called the pt and left a message

## 2023-10-29 NOTE — Telephone Encounter (Signed)
 Called patient and she said she will get it in as soon as she can.  She just bought 3 months on her insurance so she said she is alright for the moment. She will send it in June

## 2023-11-02 ENCOUNTER — Ambulatory Visit: Payer: Medicare Other | Admitting: Podiatry

## 2023-11-02 ENCOUNTER — Encounter: Payer: Self-pay | Admitting: Podiatry

## 2023-11-02 DIAGNOSIS — M79675 Pain in left toe(s): Secondary | ICD-10-CM

## 2023-11-02 DIAGNOSIS — B351 Tinea unguium: Secondary | ICD-10-CM | POA: Diagnosis not present

## 2023-11-02 DIAGNOSIS — M79674 Pain in right toe(s): Secondary | ICD-10-CM | POA: Diagnosis not present

## 2023-11-07 ENCOUNTER — Encounter: Payer: Self-pay | Admitting: Podiatry

## 2023-11-07 NOTE — Progress Notes (Signed)
  Subjective:  Patient ID: Linda Johnson, female    DOB: 1936/06/26,  MRN: [id number]  88 y.o. female presents painful elongated mycotic toenails 1-5 bilaterally which are tender when wearing enclosed shoe gear. Pain is relieved with periodic professional debridement. Chief Complaint  Patient presents with   Nail Problem    "I'm here for a toenail trimming."   New problem(s): None   PCP is Tower, Manley Seeds, MD , and last visit was April 06, 2023.  Allergies  Allergen Reactions   Ace Inhibitors Cough        Hydrocodone  Bit-Homatrop Mbr Nausea Only   Tramadol Hcl Nausea Only         Review of Systems: Negative except as noted in the HPI.   Objective:  Linda Johnson is a pleasant 88 y.o. female WD, WN in NAD. AAO x 3.  Vascular Examination: Vascular status intact b/l with palpable pedal pulses. CFT immediate b/l. Pedal hair present. No edema. No pain with calf compression b/l. Skin temperature gradient WNL b/l. No varicosities noted. No cyanosis or clubbing noted.  Neurological Examination: Sensation grossly intact b/l with 10 gram monofilament. Vibratory sensation intact b/l.  Dermatological Examination: Pedal skin with normal turgor, texture and tone b/l. No open wounds nor interdigital macerations noted. Toenails 1-5 b/l thick, discolored, elongated with subungual debris and pain on dorsal palpation. No hyperkeratotic lesions noted b/l.   Musculoskeletal Examination: Muscle strength 5/5 to b/l LE.  No pain, crepitus noted b/l. No gross pedal deformities. Patient ambulates independently without assistive aids.   Radiographs: None  Last A1c:      Latest Ref Rng & Units 07/16/2023   10:43 AM 12/23/2022   12:11 PM  Hemoglobin A1C  Hemoglobin-A1c 4.6 - 6.5 % 5.7  5.6     Assessment:   1. Pain due to onychomycosis of toenails of both feet    Plan:  -Consent given for treatment as described below: -Examined patient. -Patient to continue soft, supportive shoe gear  daily. -Mycotic toenails 1-5 bilaterally were debrided in length and girth with sterile nail nippers and dremel without incident. -For dry skin, recommended daily use of Bag Balm Hand and Body Moistuizer which may be purchased at local drug store or on [employer]. -Patient/POA to call should there be question/concern in the interim.  Return in about 3 months (around 02/02/2024).  Linda Johnson, DPM      [place] LOCATION: [address] [place] [address]                   Office [phone]   [facility] LOCATION: [address] [place], [place] [address] Office [phone]

## 2023-11-25 ENCOUNTER — Other Ambulatory Visit: Payer: Self-pay | Admitting: Family Medicine

## 2023-11-25 DIAGNOSIS — Z1231 Encounter for screening mammogram for malignant neoplasm of breast: Secondary | ICD-10-CM

## 2023-12-02 ENCOUNTER — Telehealth: Payer: Self-pay

## 2023-12-02 NOTE — Telephone Encounter (Signed)
 Does she want to do labs earlier than the appointment ?  (Like that morning?)  If so I will put in orders  If not I will do orders during our visit and then send her to the lab  Either way is fine, let me know   Thanks

## 2023-12-02 NOTE — Telephone Encounter (Signed)
 Copied from CRM [id number]. Topic: Appointments - Appointment Info/Confirmation >> Dec 02, 2023 10:26 AM Howard Macho wrote: Patient/patient representative is calling for information regarding an appointment.  Patient called wanting to see if she can do her labs on the same day as her physical on 7/1 and want the nurse to put orders in. Patient would like a call back CB [phone]

## 2023-12-02 NOTE — Telephone Encounter (Signed)
 Patient was notified and made aware. Patient says she just did not want to make two trips to the office. Patient was informed she will not and her Wellness Visit will be via telephone. Patient verbalized understanding and has no further questions at this time.

## 2023-12-17 ENCOUNTER — Encounter: Payer: Medicare Other | Admitting: Family Medicine

## 2023-12-28 ENCOUNTER — Telehealth: Payer: Self-pay | Admitting: Pharmacy Technician

## 2023-12-28 ENCOUNTER — Telehealth: Payer: Self-pay | Admitting: Cardiovascular Disease

## 2023-12-28 NOTE — Telephone Encounter (Signed)
 Patient daughter came by office and dropped off Patient Assistance paperwork for Eliquis   Faxed to Patient Assistance

## 2023-12-28 NOTE — Telephone Encounter (Signed)
 PAP: Application for Eliquis  has been submitted to [employer] ([employer]), via fax    Missing provider portion. Says its a renewal. Seeing if [employer] needs provider portion.  Faxed patient form, oop, proof of income and insurance card

## 2023-12-29 ENCOUNTER — Ambulatory Visit: Admitting: Family Medicine

## 2023-12-29 ENCOUNTER — Ambulatory Visit: Payer: Self-pay | Admitting: Family Medicine

## 2023-12-29 ENCOUNTER — Encounter: Payer: Self-pay | Admitting: Family Medicine

## 2023-12-29 VITALS — BP 134/78 | HR 95 | Temp 97.6°F | Ht 63.0 in | Wt 170.0 lb

## 2023-12-29 DIAGNOSIS — Z0000 Encounter for general adult medical examination without abnormal findings: Secondary | ICD-10-CM | POA: Diagnosis not present

## 2023-12-29 DIAGNOSIS — M810 Age-related osteoporosis without current pathological fracture: Secondary | ICD-10-CM | POA: Diagnosis not present

## 2023-12-29 DIAGNOSIS — E782 Mixed hyperlipidemia: Secondary | ICD-10-CM | POA: Diagnosis not present

## 2023-12-29 DIAGNOSIS — R7303 Prediabetes: Secondary | ICD-10-CM

## 2023-12-29 DIAGNOSIS — I5032 Chronic diastolic (congestive) heart failure: Secondary | ICD-10-CM | POA: Diagnosis not present

## 2023-12-29 DIAGNOSIS — N1831 Chronic kidney disease, stage 3a: Secondary | ICD-10-CM

## 2023-12-29 DIAGNOSIS — E66811 Obesity, class 1: Secondary | ICD-10-CM

## 2023-12-29 DIAGNOSIS — E039 Hypothyroidism, unspecified: Secondary | ICD-10-CM

## 2023-12-29 DIAGNOSIS — I10 Essential (primary) hypertension: Secondary | ICD-10-CM | POA: Diagnosis not present

## 2023-12-29 DIAGNOSIS — I4821 Permanent atrial fibrillation: Secondary | ICD-10-CM | POA: Diagnosis not present

## 2023-12-29 DIAGNOSIS — Z6830 Body mass index (BMI) 30.0-30.9, adult: Secondary | ICD-10-CM

## 2023-12-29 DIAGNOSIS — H9193 Unspecified hearing loss, bilateral: Secondary | ICD-10-CM

## 2023-12-29 LAB — LIPID PANEL
Cholesterol: 140 mg/dL (ref 0–200)
HDL: 44.8 mg/dL (ref >39.00)
LDL Cholesterol: 81 mg/dL (ref 0–99)
NonHDL: 95.07
Total CHOL/HDL Ratio: 3
Triglycerides: 69 mg/dL (ref 0.0–149.0)
VLDL: 13.8 mg/dL (ref 0.0–40.0)

## 2023-12-29 LAB — CBC WITH DIFFERENTIAL/PLATELET
Basophils Absolute: 0 10*3/uL (ref 0.0–0.1)
Basophils Relative: 0.7 % (ref 0.0–3.0)
Eosinophils Absolute: 0.1 10*3/uL (ref 0.0–0.7)
Eosinophils Relative: 0.9 % (ref 0.0–5.0)
HCT: 41.3 % (ref 36.0–46.0)
Hemoglobin: 13.6 g/dL (ref 12.0–15.0)
Lymphocytes Relative: 18.4 % (ref 12.0–46.0)
Lymphs Abs: 1 10*3/uL (ref 0.7–4.0)
MCHC: 33 g/dL (ref 30.0–36.0)
MCV: 93.6 fl (ref 78.0–100.0)
Monocytes Absolute: 0.3 10*3/uL (ref 0.1–1.0)
Monocytes Relative: 5.1 % (ref 3.0–12.0)
Neutro Abs: 4 10*3/uL (ref 1.4–7.7)
Neutrophils Relative %: 74.9 % (ref 43.0–77.0)
Platelets: 180 10*3/uL (ref 150.0–400.0)
RBC: 4.41 Mil/uL (ref 3.87–5.11)
RDW: 14.2 % (ref 11.5–15.5)
WBC: 5.4 10*3/uL (ref 4.0–10.5)

## 2023-12-29 LAB — HEMOGLOBIN A1C: Hgb A1c MFr Bld: 5.9 % (ref 4.6–6.5)

## 2023-12-29 LAB — COMPREHENSIVE METABOLIC PANEL WITH GFR
ALT: 9 U/L (ref 0–35)
AST: 12 U/L (ref 0–37)
Albumin: 4.2 g/dL (ref 3.5–5.2)
Alkaline Phosphatase: 64 U/L (ref 39–117)
BUN: 22 mg/dL (ref 6–23)
CO2: 33 meq/L — ABNORMAL HIGH (ref 19–32)
Calcium: 9.3 mg/dL (ref 8.4–10.5)
Chloride: 100 meq/L (ref 96–112)
Creatinine, Ser: 1.06 mg/dL (ref 0.40–1.20)
GFR: 47.83 mL/min — ABNORMAL LOW (ref >60.00)
Glucose, Bld: 106 mg/dL — ABNORMAL HIGH (ref 70–99)
Potassium: 4.1 meq/L (ref 3.5–5.1)
Sodium: 140 meq/L (ref 135–145)
Total Bilirubin: 1 mg/dL (ref 0.2–1.2)
Total Protein: 7.3 g/dL (ref 6.0–8.3)

## 2023-12-29 LAB — TSH: TSH: 3.72 u[IU]/mL (ref 0.35–5.50)

## 2023-12-29 LAB — VITAMIN D 25 HYDROXY (VIT D DEFICIENCY, FRACTURES): VITD: 56.82 ng/mL (ref 30.00–100.00)

## 2023-12-29 MED ORDER — ALENDRONATE SODIUM 70 MG PO TABS: 70.0000 mg | ORAL_TABLET | ORAL | 3 refills | Status: DC

## 2023-12-29 NOTE — Assessment & Plan Note (Signed)
Disc goals for lipids and reasons to control them Rev last labs with pt Rev low sat fat diet in detail  Lab today

## 2023-12-29 NOTE — Assessment & Plan Note (Signed)
 A1c ordered  disc imp of low glycemic diet and wt loss to prevent DM2

## 2023-12-29 NOTE — Patient Instructions (Addendum)
  I want you to have a hearing evaluation in the future  Check with your insurance to see if they would help pay for hearing aides  Let us  know   I want you to get stronger  Add some strength training to your routine, this is important for bone and brain health and can reduce your risk of falls and help your body use insulin properly and regulate weight  Light weights, exercise bands , and internet videos are a good way to start  Yoga (chair or regular), machines , floor exercises or a gym with machines are also good options   Labs today  If labs look ok I will put you back on the fosamax  for osteoporosis

## 2023-12-29 NOTE — Assessment & Plan Note (Signed)
 bp in fair control at this time  BP Readings from Last 1 Encounters:  12/29/23 134/78   No changes needed Most recent labs reviewed  Disc lifstyle change with low sodium diet and exercise  Under card care Plan to continue Cardizem  120 mg daily  Cardura  4 mg bid Lasix  20 mg daily Metoprolol  xl 25 mg daily Losartan  100 mg daily  Also under cardiology care  Watching GFR  Lab today

## 2023-12-29 NOTE — Assessment & Plan Note (Signed)
 Lab today Per pt good fluid intake

## 2023-12-29 NOTE — Assessment & Plan Note (Signed)
 Bilateral Worsening  Offered referral to audiology  Pt is unsure if insurance would help pay for hearing aid   She will check and let us  know  I shared concern about safety with poor hearing She continues to be social

## 2023-12-29 NOTE — Assessment & Plan Note (Signed)
 TSH today No clinical changes No supplementation

## 2023-12-29 NOTE — Assessment & Plan Note (Signed)
 No clinical changes Under care of cardiology  Continues cardizem  and metoprolol    Some issues paying for eliquis -per pt cardiology is helping with that

## 2023-12-29 NOTE — Assessment & Plan Note (Signed)
 Reviewed health habits including diet and exercise and skin cancer prevention Reviewed appropriate screening tests for age  Also reviewed health mt list, fam hx and immunization status , as well as social and family history   See HPI Labs reviewed and ordered Health Maintenance  Topic Date Due   COVID-19 Vaccine (7 - 2024-25 season) 01/13/2026*   Mammogram  01/14/2024   Flu Shot  01/29/2024   Medicare Annual Wellness Visit  12/28/2024   DTaP/Tdap/Td vaccine (3 - Td or Tdap) 03/30/2033   Pneumococcal Vaccine for age over 30  Completed   DEXA scan (bone density measurement)  Completed   Zoster (Shingles) Vaccine  Completed   Hepatitis B Vaccine  Aged Out   HPV Vaccine  Aged Out   Meningitis B Vaccine  Aged Out  *Topic was postponed. The date shown is not the original due date.    Discussed fall prevention, supplements and exercise for bone density  No falls or fracture Needs hearing aides-pt declines audiology ref until she knows if insu would pay Mammogram is scheduled this mo  PHQ 0

## 2023-12-29 NOTE — Progress Notes (Signed)
 Subjective:    Patient ID: Rock JONELLE Spain, female    DOB: Feb 15, 1936, 88 y.o.   MRN: [id number]  HPI  Here for health maintenance exam and to review chronic medical problems   Wt Readings from Last 3 Encounters:  12/29/23 170 lb (77.1 kg)  04/20/23 171 lb 2 oz (77.6 kg)  04/06/23 163 lb 6 oz (74.1 kg)   30.11 kg/m  Vitals:   12/29/23 1058  BP: 134/78  Pulse: 95  Temp: 97.6 F (36.4 C)  SpO2: 95%    Immunization History  Administered Date(s) Administered   Fluad Quad(high Dose 65+) 03/01/2019, 04/09/2022   Influenza Split 04/01/2011, 03/17/2012   Influenza Whole 04/23/2006, 04/02/2007, 03/27/2008, 04/16/2009, 04/23/2010   Influenza, High Dose Seasonal PF 03/23/2020, 04/04/2021, 03/31/2023   Influenza,inj,Quad PF,6+ Mos 03/24/2013, 04/10/2014, 04/13/2015, 03/19/2016, 03/25/2017, 05/06/2018   PFIZER(Purple Top)SARS-COV-2 Vaccination 07/11/2019, 08/01/2019, 03/23/2020   Pfizer Covid-19 Vaccine Bivalent Booster 33yrs & up 04/04/2021   Pfizer(Comirnaty)Fall Seasonal Vaccine 12 years and older 04/09/2022, 03/31/2023   Pneumococcal Conjugate-13 09/11/2014   Pneumococcal Polysaccharide-23 04/16/2009   Td 04/23/2006   Tdap 03/31/2023   Zoster Recombinant(Shingrix) 03/22/2019, 06/10/2019    There are no preventive care reminders to display for this patient.  Feeling ok overall   Mammogram 12/2022 -has it scheduled for the 28th of the month  Self breast exam no lumps   Gyn health No problems    Colon cancer screening -out aged/ declines follow up colonoscopy for polyps   Bone health  Dexa 01/2023 - osteoporosis Was on alendronate  -took her off of it  Prolia denied by insurance  Falls- none Fractures- none  Supplements  Last vitamin D  Lab Results  Component Value Date   VD25OH 56.82 12/29/2023    Exercise  Trying to stay active Does some leg and arm exercises every day (lifts)  Does everything at home  Uses a walker      Hearing loss is a problem   Declines testing/audiology - pt is afraid her insurance does not pay for hearing aides    Mood    04/06/2023   11:56 AM 12/23/2022   11:33 AM 12/16/2022   10:14 AM 12/18/2021   11:21 AM 12/11/2021   10:35 AM  Depression screen PHQ 2/9  Decreased Interest 0 0 0 0 0  Down, Depressed, Hopeless 0 0 0 0 0  PHQ - 2 Score 0 0 0 0 0  Altered sleeping 0 0 0  0  Tired, decreased energy 0 3 3  0  Change in appetite 0 0 0  0  Feeling bad or failure about yourself  0 0 0  0  Trouble concentrating 0 0 0  0  Moving slowly or fidgety/restless 0 0 0  0  Suicidal thoughts 0 0 0  0  PHQ-9 Score 0 3 3  0  Difficult doing work/chores Not difficult at all Not difficult at all Not difficult at all  Not difficult at all   Does socialize a lot and talk to friends   HTN bp is stable today  No cp or palpitations or headaches or edema  No side effects to medicines  BP Readings from Last 3 Encounters:  12/29/23 134/78  04/20/23 (!) 120/56  04/06/23 131/80   Cardizem  120 mg daily  Cardura  4 mg bid Lasix  20 mg daily Metoprolol  xl 25 mg daily Losartan  100 mg daily   Pulse Readings from Last 3 Encounters:  12/29/23 95  04/20/23 79  04/06/23 92  A fib -no new symptoms  Sees cardiology  Eliquis  Rate control with cardizem  and metoprolol      CKD 3a Lab Results  Component Value Date   NA 140 12/29/2023   K 4.1 12/29/2023   CO2 33 (H) 12/29/2023   GLUCOSE 106 (H) 12/29/2023   BUN 22 12/29/2023   CREATININE 1.06 12/29/2023   CALCIUM  9.3 12/29/2023   GFR 47.83 (L) 12/29/2023   EGFR 59 (L) 09/23/2021   GFRNONAA 45 (L) 08/26/2018   Is drinking more water in the hot weather    Hypothyroidism in past  Pt has no clinical changes No change in energy level/ hair or skin/ edema and no tremor Lab Results  Component Value Date   TSH 3.72 12/29/2023    No current thyroid  supplementation   Hyperlipidemia Lab Results  Component Value Date   CHOL 140 12/29/2023   HDL 44.80 12/29/2023    LDLCALC 81 12/29/2023   LDLDIRECT 132.4 05/06/2010   TRIG 69.0 12/29/2023   CHOLHDL 3 12/29/2023   Due for labs  Diet controlled    Prediabetes Lab Results  Component Value Date   HGBA1C 5.9 12/29/2023   HGBA1C 5.7 07/16/2023   HGBA1C 5.6 12/23/2022   Due for labs   Eating a healthy diet  A lot of salads Avoids fast foods      Patient Active Problem List   Diagnosis Date Noted   Enlarged and hypertrophic nails 12/23/2022   CKD stage 3a, GFR 45-59 ml/min (HCC) 12/23/2022   History of COVID-19 11/23/2020   Chronic diastolic CHF (congestive heart failure) (HCC) 08/30/2018   Pedal edema 08/23/2018   PVC (premature ventricular contraction) 01/18/2018   Poor balance 10/05/2017   Post herpetic neuralgia 09/23/2017   Encounter for anticoagulation discussion and counseling 09/20/2015   Irregular heart rate 09/17/2015   Permanent atrial fibrillation (HCC) 09/17/2015   Hearing loss 09/17/2015   Hypothyroidism 12/12/2014   Prediabetes 09/11/2014   Estrogen deficiency 09/11/2014   Routine general medical examination at a health care facility 09/09/2013   Low back pain 03/21/2013   Other screening mammogram 04/19/2012   Post-menopausal 04/19/2012   Obesity 08/18/2011   Hyperlipidemia 06/12/2010   Osteoporosis 06/12/2010   Allergic rhinitis 04/12/2008   History of colonic polyps 04/02/2007   Disorder of eye movements 03/02/2007   Essential hypertension 03/02/2007   Past Medical History:  Diagnosis Date   Arthritis    hips, knees   Arthritis    Atrial fibrillation (HCC)    chronic   Back pain    Balance problem    Cardiomegaly    Cataract 2020   Right eye December 06, 2018, Left eye date pending   CHF (congestive heart failure) (HCC) 07/2018   chronic   Hypertension    Osteopenia    Pleural effusion 08/25/2018   [facility]   Shingles 08/2017   H/O   SOB (shortness of breath)    Past Surgical History:  Procedure Laterality Date   BREAST BIOPSY Left 2011   2 areas,  cysts per pt   CATARACT EXTRACTION W/PHACO Right 12/06/2018   Procedure: CATARACT EXTRACTION PHACO AND INTRAOCULAR LENS PLACEMENT (IOC) RIGHT;  Surgeon: Myrna Adine Anes, MD;  Location[facility];  Service: Ophthalmology;  Laterality: Right;   CATARACT EXTRACTION W/PHACO Left 01/17/2019   Procedure: CATARACT EXTRACTION PHACO AND INTRAOCULAR LENS PLACEMENT (IOC) LEFT;  Surgeon: Myrna Adine Anes, MD;  Location: [facility];  Service: Ophthalmology;  Laterality: Left;   Social History  Tobacco Use   Smoking status: Never   Smokeless tobacco: Never  Vaping Use   Vaping status: Never Used  Substance Use Topics   Alcohol use: No    Alcohol/week: 0.0 standard drinks of alcohol   Drug use: No   Family History  Problem Relation Age of Onset   Leukemia Sister    Breast cancer Sister 71   Breast cancer Maternal Aunt 50   Allergies  Allergen Reactions   Ace Inhibitors Cough        Hydrocodone  Bit-Homatrop Mbr Nausea Only   Tramadol Hcl Nausea Only        Current Outpatient Medications on File Prior to Visit  Medication Sig Dispense Refill   apixaban  (ELIQUIS ) 5 MG TABS tablet Take 1 tablet (5 mg total) by mouth 2 (two) times daily. 180 tablet 1   diltiazem  (CARDIZEM  CD) 120 MG 24 hr capsule TAKE 1 CAPSULE BY MOUTH EVERY DAY 90 capsule 2   doxazosin  (CARDURA ) 8 MG tablet TAKE 1/2 TABLET (4 MG TOTAL) BY MOUTH 2 (TWO) TIMES DAILY. 90 tablet 3   furosemide  (LASIX ) 20 MG tablet Take 1 tablet (20 mg total) by mouth daily. 90 tablet 2   latanoprost  (XALATAN ) 0.005 % ophthalmic solution INSTILL ONE DROP IN BOTH EYES AT BEDTIME.  3   losartan  (COZAAR ) 100 MG tablet TAKE 1 TABLET BY MOUTH EVERY DAY 90 tablet 3   metoprolol  succinate (TOPROL -XL) 25 MG 24 hr tablet TAKE 1 TABLET (25 MG TOTAL) BY MOUTH DAILY. 90 tablet 3   potassium chloride  (KLOR-CON ) 10 MEQ tablet TAKE 1 TABLET BY MOUTH EVERY DAY 90 tablet 3   No current facility-administered medications on file prior to  visit.    Review of Systems  Constitutional:  Positive for fatigue. Negative for activity change, appetite change, fever and unexpected weight change.  HENT:  Positive for hearing loss. Negative for congestion, ear pain, rhinorrhea, sinus pressure and sore throat.   Eyes:  Negative for pain, redness and visual disturbance.  Respiratory:  Negative for cough, shortness of breath and wheezing.   Cardiovascular:  Negative for chest pain and palpitations.  Gastrointestinal:  Negative for abdominal pain, blood in stool, constipation and diarrhea.  Endocrine: Negative for polydipsia and polyuria.  Genitourinary:  Negative for dysuria, frequency and urgency.  Musculoskeletal:  Positive for arthralgias. Negative for back pain and myalgias.  Skin:  Negative for pallor and rash.  Allergic/Immunologic: Negative for environmental allergies.  Neurological:  Negative for dizziness, syncope and headaches.  Hematological:  Negative for adenopathy. Does not bruise/bleed easily.  Psychiatric/Behavioral:  Negative for decreased concentration and dysphoric mood. The patient is not nervous/anxious.        Objective:   Physical Exam Constitutional:      General: She is not in acute distress.    Appearance: Normal appearance. She is well-developed. She is obese. She is not ill-appearing or diaphoretic.  HENT:     Head: Normocephalic and atraumatic.     Right Ear: Tympanic membrane, ear canal and external ear normal.     Left Ear: Tympanic membrane, ear canal and external ear normal.     Nose: Nose normal. No congestion.     Mouth/Throat:     Mouth: Mucous membranes are moist.     Pharynx: Oropharynx is clear. No posterior oropharyngeal erythema.   Eyes:     General: No scleral icterus.    Extraocular Movements: Extraocular movements intact.     Conjunctiva/sclera: Conjunctivae normal.  Pupils: Pupils are equal, round, and reactive to light.   Neck:     Thyroid : No thyromegaly.     Vascular: No  carotid bruit or JVD.   Cardiovascular:     Rate and Rhythm: Normal rate and regular rhythm.     Pulses: Normal pulses.     Heart sounds: Normal heart sounds.     No gallop.  Pulmonary:     Effort: Pulmonary effort is normal. No respiratory distress.     Breath sounds: Normal breath sounds. No wheezing.     Comments: Good air exch Chest:     Chest wall: No tenderness.  Abdominal:     General: Bowel sounds are normal. There is no distension or abdominal bruit.     Palpations: Abdomen is soft. There is no mass.     Tenderness: There is no abdominal tenderness.     Hernia: No hernia is present.  Genitourinary:    Comments: Breast exam: No mass, nodules, thickening, tenderness, bulging, retraction, inflamation, nipple discharge or skin changes noted.  No axillary or clavicular LA.      Musculoskeletal:        General: No tenderness. Normal range of motion.     Cervical back: Normal range of motion and neck supple. No rigidity. No muscular tenderness.     Right lower leg: No edema.     Left lower leg: No edema.     Comments: mild kyphosis   Lymphadenopathy:     Cervical: No cervical adenopathy.   Skin:    General: Skin is warm and dry.     Coloration: Skin is not pale.     Findings: No erythema or rash.     Comments: Solar lentigines diffusely    Neurological:     Mental Status: She is alert. Mental status is at baseline.     Cranial Nerves: No cranial nerve deficit.     Motor: No abnormal muscle tone.     Coordination: Coordination normal.     Gait: Gait normal.     Deep Tendon Reflexes: Reflexes are normal and symmetric. Reflexes normal.   Psychiatric:        Mood and Affect: Mood normal.        Cognition and Memory: Cognition and memory normal.           Assessment & Plan:   Problem List Items Addressed This Visit       Cardiovascular and Mediastinum   Permanent atrial fibrillation (HCC)   No clinical changes Under care of cardiology  Continues cardizem   and metoprolol    Some issues paying for eliquis -per pt cardiology is helping with that       Essential hypertension   bp in fair control at this time  BP Readings from Last 1 Encounters:  12/29/23 134/78   No changes needed Most recent labs reviewed  Disc lifstyle change with low sodium diet and exercise  Under card care Plan to continue Cardizem  120 mg daily  Cardura  4 mg bid Lasix  20 mg daily Metoprolol  xl 25 mg daily Losartan  100 mg daily  Also under cardiology care  Watching GFR  Lab today      Relevant Orders   Comprehensive metabolic panel with GFR (Completed)   Lipid Panel (Completed)   TSH (Completed)   CBC with Differential/Platelet (Completed)   Chronic diastolic CHF (congestive heart failure) (HCC)   Clinically stable Under care of cardiology   Lasix  20 mg  Losartan  100 mg daily  Endocrine   Hypothyroidism   TSH today No clinical changes No supplementation       Relevant Orders   TSH (Completed)     Nervous and Auditory   Hearing loss   Bilateral Worsening  Offered referral to audiology  Pt is unsure if insurance would help pay for hearing aid   She will check and let us  know  I shared concern about safety with poor hearing She continues to be social        Musculoskeletal and Integument   Osteoporosis   Dexa noted worsening  Insurance declined prolia Lab today-if GFR is acceptable will re start alendronate   Discussed fall prevention, supplements and exercise for bone density        Relevant Orders   VITAMIN D  25 Hydroxy (Vit-D Deficiency, Fractures) (Completed)     Genitourinary   CKD stage 3a, GFR 45-59 ml/min (HCC)   Lab today Per pt good fluid intake      Relevant Orders   Comprehensive metabolic panel with GFR (Completed)     Other   Routine general medical examination at a health care facility - Primary   Reviewed health habits including diet and exercise and skin cancer prevention Reviewed appropriate  screening tests for age  Also reviewed health mt list, fam hx and immunization status , as well as social and family history   See HPI Labs reviewed and ordered Health Maintenance  Topic Date Due   COVID-19 Vaccine (7 - 2024-25 season) 01/13/2026*   Mammogram  01/14/2024   Flu Shot  01/29/2024   Medicare Annual Wellness Visit  12/28/2024   DTaP/Tdap/Td vaccine (3 - Td or Tdap) 03/30/2033   Pneumococcal Vaccine for age over 54  Completed   DEXA scan (bone density measurement)  Completed   Zoster (Shingles) Vaccine  Completed   Hepatitis B Vaccine  Aged Out   HPV Vaccine  Aged Out   Meningitis B Vaccine  Aged Out  *Topic was postponed. The date shown is not the original due date.    Discussed fall prevention, supplements and exercise for bone density  No falls or fracture Needs hearing aides-pt declines audiology ref until she knows if insu would pay Mammogram is scheduled this mo  PHQ 0        Prediabetes   A1c ordered  disc imp of low glycemic diet and wt loss to prevent DM2       Relevant Orders   Hemoglobin A1c (Completed)   Obesity   Discussed how this problem influences overall health and the risks it imposes  Reviewed plan for weight loss with lower calorie diet (via better food choices (lower glycemic and portion control) along with exercise building up to or more than 30 minutes 5 days per week including some aerobic activity and strength training         Hyperlipidemia   Disc goals for lipids and reasons to control them Rev last labs with pt Rev low sat fat diet in detail  Lab today      Relevant Orders   Comprehensive metabolic panel with GFR (Completed)   Lipid Panel (Completed)

## 2023-12-29 NOTE — Assessment & Plan Note (Signed)
Clinically stable Under care of cardiology   Lasix 20 mg  Losartan 100 mg daily

## 2023-12-29 NOTE — Assessment & Plan Note (Signed)
 Dexa noted worsening  Insurance declined prolia Lab today-if GFR is acceptable will re start alendronate   Discussed fall prevention, supplements and exercise for bone density

## 2023-12-29 NOTE — Assessment & Plan Note (Signed)
 Discussed how this problem influences overall health and the risks it imposes  Reviewed plan for weight loss with lower calorie diet (via better food choices (lower glycemic and portion control) along with exercise building up to or more than 30 minutes 5 days per week including some aerobic activity and strength training

## 2023-12-31 NOTE — Telephone Encounter (Signed)
 I called [employer] and they do need the provider portion. A blank provider portion is scanned in media on 10/29/23. Can someone get the provider to sign and fax back to us  at [phone] please and thank you

## 2023-12-31 NOTE — Telephone Encounter (Signed)
 Form signed and faxed back to [employer]

## 2023-12-31 NOTE — Telephone Encounter (Signed)
 Faxed [employer] provider form

## 2024-01-05 ENCOUNTER — Other Ambulatory Visit: Payer: Self-pay | Admitting: Cardiovascular Disease

## 2024-01-05 NOTE — Telephone Encounter (Signed)
 I called [employer] and they have everything and they have it under review

## 2024-01-06 ENCOUNTER — Other Ambulatory Visit (HOSPITAL_COMMUNITY): Payer: Self-pay

## 2024-01-06 NOTE — Telephone Encounter (Signed)
   LMOM for patient need to go over options

## 2024-01-06 NOTE — Telephone Encounter (Signed)
 Per test claims: Xarelto 20mg  would be 145.52 for 30 days  Dabigatran would be 39.77 for 30 days or 118.93 for 90 days  Pradaxa would be 48.40 for 30 days but says consider generic Savaysa 60mg  334.92 for 90 days  Eliquis  too soon until 03/04/24 to see price of it again  She was on warfarin in the past   She is going to wait until sept and see what eliquis  is then and will send more oop when she gets it

## 2024-01-21 ENCOUNTER — Ambulatory Visit

## 2024-01-21 VITALS — BP 134/78 | Ht 63.0 in | Wt 170.0 lb

## 2024-01-21 DIAGNOSIS — Z0000 Encounter for general adult medical examination without abnormal findings: Secondary | ICD-10-CM | POA: Diagnosis not present

## 2024-01-21 NOTE — Progress Notes (Signed)
 Because this visit was a virtual/telehealth visit,  certain criteria was not obtained, such a blood pressure, CBG if applicable, and timed get up and go. Any medications not marked as taking were not mentioned during the medication reconciliation part of the visit. Any vitals not documented were not able to be obtained due to this being a telehealth visit or patient was unable to self-report a recent blood pressure reading due to a lack of equipment at home via telehealth. Vitals that have been documented are verbally provided by the patient.   This visit was performed by a medical professional under my direct supervision. I was immediately available for consultation/collaboration. I have reviewed and agree with the Annual Wellness Visit documentation.  Subjective:   Linda Johnson is a 88 y.o. who presents for a Medicare Wellness preventive visit.  As a reminder, Annual Wellness Visits don't include a physical exam, and some assessments may be limited, especially if this visit is performed virtually. We may recommend an in-person follow-up visit with your provider if needed.  Visit Complete: Virtual I connected with  Linda Johnson on 01/21/24 by a audio enabled telemedicine application and verified that I am speaking with the correct person using two identifiers.  Patient Location: Home  Provider Location: Home Office  I discussed the limitations of evaluation and management by telemedicine. The patient expressed understanding and agreed to proceed.  Vital Signs: Because this visit was a virtual/telehealth visit, some criteria may be missing or patient reported. Any vitals not documented were not able to be obtained and vitals that have been documented are patient reported.  VideoDeclined- This patient declined Librarian, academic. Therefore the visit was completed with audio only.  Persons Participating in Visit: Patient.  AWV Questionnaire: No: Patient  Medicare AWV questionnaire was not completed prior to this visit.  Cardiac Risk Factors include: advanced age (>38men, >39 women);hypertension;Other (see comment);obesity (BMI >30kg/m2);dyslipidemia     Objective:    Today's Vitals   01/21/24 1026  BP: 134/78  Weight: 170 lb (77.1 kg)  Height: 5' 3 (1.6 m)   Body mass index is 30.11 kg/m.     01/21/2024   10:24 AM 12/16/2022   10:13 AM 12/11/2021   10:39 AM 12/05/2020   12:05 PM 01/17/2019   10:44 AM 12/06/2018   10:03 AM 11/23/2018   12:23 PM  Advanced Directives  Does Patient Have a Medical Advance Directive? Yes Yes Yes Yes Yes Yes Yes  Type of Estate agent of [place];Living will Living will Healthcare Power of [place];Living will Healthcare Power of [place];Living will Healthcare Power of [place];Living will Healthcare Power of [place];Living will Healthcare Power of [place];Living will  Does patient want to make changes to medical advance directive? No - Patient declined No - Patient declined Yes (Inpatient - patient defers changing a medical advance directive and declines information at this time)  Yes (MAU/Ambulatory/Procedural Areas - Information given)  No - Patient declined    Copy of Healthcare Power of Attorney in Chart? Yes - validated most recent copy scanned in chart (See row information)  Yes - validated most recent copy scanned in chart (See row information) Yes - validated most recent copy scanned in chart (See row information) No - copy requested  Yes - validated most recent copy scanned in chart (See row information)  No - copy requested      Data saved with a previous flowsheet row definition    Current Medications (verified) Outpatient Encounter Medications  as of 01/21/2024  Medication Sig   alendronate  (FOSAMAX ) 70 MG tablet Take 1 tablet (70 mg total) by mouth every 7 (seven) days. Take with a full glass of water on an empty stomach.   apixaban  (ELIQUIS ) 5 MG TABS tablet Take 1 tablet  (5 mg total) by mouth 2 (two) times daily.   diltiazem  (CARDIZEM  CD) 120 MG 24 hr capsule TAKE 1 CAPSULE BY MOUTH EVERY DAY   doxazosin  (CARDURA ) 8 MG tablet TAKE 1/2 TABLET (4 MG TOTAL) BY MOUTH 2 (TWO) TIMES DAILY.   furosemide  (LASIX ) 20 MG tablet Take 1 tablet (20 mg total) by mouth daily.   latanoprost  (XALATAN ) 0.005 % ophthalmic solution INSTILL ONE DROP IN BOTH EYES AT BEDTIME.   losartan  (COZAAR ) 100 MG tablet TAKE 1 TABLET BY MOUTH EVERY DAY   metoprolol  succinate (TOPROL -XL) 25 MG 24 hr tablet TAKE 1 TABLET (25 MG TOTAL) BY MOUTH DAILY.   potassium chloride  (KLOR-CON ) 10 MEQ tablet TAKE 1 TABLET BY MOUTH EVERY DAY   No facility-administered encounter medications on file as of 01/21/2024.    Allergies (verified) Ace inhibitors, Hydrocodone  bit-homatrop mbr, and Tramadol hcl   History: Past Medical History:  Diagnosis Date   Arthritis    hips, knees   Arthritis    Atrial fibrillation (HCC)    chronic   Back pain    Balance problem    Cardiomegaly    Cataract 2020   Right eye December 06, 2018, Left eye date pending   CHF (congestive heart failure) (HCC) 07/2018   chronic   Hypertension    Osteopenia    Pleural effusion 08/25/2018   [facility]   Shingles 08/2017   H/O   SOB (shortness of breath)    Past Surgical History:  Procedure Laterality Date   BREAST BIOPSY Left 2011   2 areas, cysts per pt   CATARACT EXTRACTION W/PHACO Right 12/06/2018   Procedure: CATARACT EXTRACTION PHACO AND INTRAOCULAR LENS PLACEMENT (IOC) RIGHT;  Surgeon: Myrna Adine Anes, MD;  [facility];  Service: Ophthalmology;  Laterality: Right;   CATARACT EXTRACTION W/PHACO Left 01/17/2019   Procedure: CATARACT EXTRACTION PHACO AND INTRAOCULAR LENS PLACEMENT (IOC) LEFT;  Surgeon: Myrna Adine Anes, MD;  Location: [facility];  Service: Ophthalmology;  Laterality: Left;   Family History  Problem Relation Age of Onset   Leukemia Sister    Breast cancer Sister 30   Breast cancer  Maternal Aunt 66   Social History   Socioeconomic History   Marital status: Divorced    Spouse name: Not on file   Number of children: 3   Years of education: 12   Highest education level: High school graduate  Occupational History   Occupation: retired  Tobacco Use   Smoking status: Never   Smokeless tobacco: Never  Vaping Use   Vaping status: Never Used  Substance and Sexual Activity   Alcohol use: No    Alcohol/week: 0.0 standard drinks of alcohol   Drug use: No   Sexual activity: Never  Other Topics Concern   Not on file  Social History Narrative   Not on file   Social Drivers of Health   Financial Resource Strain: Low Risk  (01/21/2024)   Overall Financial Resource Strain (CARDIA)    Difficulty of Paying Living Expenses: Not hard at all  Food Insecurity: No Food Insecurity (01/21/2024)   Hunger Vital Sign    Worried About Running Out of Food in the Last Year: Never true  Ran Out of Food in the Last Year: Never true  Transportation Needs: No Transportation Needs (01/21/2024)   PRAPARE - Administrator, Civil Service (Medical): No    Lack of Transportation (Non-Medical): No  Physical Activity: Insufficiently Active (01/21/2024)   Exercise Vital Sign    Days of Exercise per Week: 7 days    Minutes of Exercise per Session: 20 min  Stress: No Stress Concern Present (01/21/2024)   [employer] of Occupational Health - Occupational Stress Questionnaire    Feeling of Stress: Not at all  Social Connections: Socially Isolated (01/21/2024)   Social Connection and Isolation Panel    Frequency of Communication with Friends and Family: More than three times a week    Frequency of Social Gatherings with Friends and Family: Twice a week    Attends Religious Services: Never    Database administrator or Organizations: No    Attends Engineer, structural: Never    Marital Status: Divorced    Tobacco Counseling Counseling given: Not  Answered    Clinical Intake:  Pre-visit preparation completed: Yes  Pain : No/denies pain     BMI - recorded: 30.11 Nutritional Status: BMI > 30  Obese Nutritional Risks: None Diabetes: No  Lab Results  Component Value Date   HGBA1C 5.9 12/29/2023   HGBA1C 5.7 07/16/2023   HGBA1C 5.6 12/23/2022     How often do you need to have someone help you when you read instructions, pamphlets, or other written materials from your doctor or pharmacy?: 1 - Never  Interpreter Needed?: No  Information entered by :: Davine Sweney,CMA   Activities of Daily Living     01/21/2024   10:31 AM  In your present state of health, do you have any difficulty performing the following activities:  Hearing? 1  Vision? 0  Difficulty concentrating or making decisions? 0  Walking or climbing stairs? 0  Dressing or bathing? 0  Doing errands, shopping? 0  Preparing Food and eating ? N  Using the Toilet? N  In the past six months, have you accidently leaked urine? N  Do you have problems with loss of bowel control? N  Managing your Medications? N  Managing your Finances? N  Housekeeping or managing your Housekeeping? N    Patient Care Team: Tower, Laine LABOR, MD as PCP - General Gollan, Evalene PARAS, MD as Consulting Physician (Cardiology) Myrna Adine Anes, MD as Consulting Physician (Ophthalmology)  I have updated your Care Teams any recent Medical Services you may have received from other providers in the past year.     Assessment:   This is a routine wellness examination for [place].  Hearing/Vision screen Hearing Screening - Comments:: Patient has some hearing difficulties Vision Screening - Comments:: Patient wears glasses   Goals Addressed             This Visit's Progress    Patient Stated       Patient would like to get ear checked       Depression Screen     01/21/2024   10:35 AM 04/06/2023   11:56 AM 12/23/2022   11:33 AM 12/16/2022   10:14 AM 12/18/2021   11:21 AM  12/11/2021   10:35 AM 12/05/2020   12:07 PM  PHQ 2/9 Scores  PHQ - 2 Score 0 0 0 0 0 0 0  PHQ- 9 Score 0 0 3 3  0 0    Fall Risk     01/21/2024  10:29 AM 04/06/2023   11:56 AM 12/23/2022   11:33 AM 12/16/2022    9:49 AM 12/18/2021   11:21 AM  Fall Risk   Falls in the past year? 0 0 0 0 0  Number falls in past yr: 0 0 0 0   Injury with Fall? 0 0 0 0   Risk for fall due to : No Fall Risks No Fall Risks No Fall Risks No Fall Risks   Follow up Falls evaluation completed Falls evaluation completed Falls evaluation completed Education provided;Falls prevention discussed Falls evaluation completed      Data saved with a previous flowsheet row definition    MEDICARE RISK AT HOME:  Medicare Risk at Home Any stairs in or around the home?: Yes If so, are there any without handrails?: No Home free of loose throw rugs in walkways, pet beds, electrical cords, etc?: Yes Adequate lighting in your home to reduce risk of falls?: Yes Life alert?: No Use of a cane, walker or w/c?: No Grab bars in the bathroom?: Yes Shower chair or bench in shower?: Yes Elevated toilet seat or a handicapped toilet?: Yes  TIMED UP AND GO:  Was the test performed?  No  Cognitive Function: 6CIT completed    12/05/2020   12:11 PM 11/23/2018   12:14 PM 10/02/2017   11:36 AM 09/23/2016    2:05 PM 09/13/2015   11:06 AM  MMSE - Mini Mental State Exam  Not completed: Refused      Orientation to time  5 5 5  5    Orientation to Place  5 5 5  5    Registration  3 3 3  3    Attention/ Calculation  0 0 0  5   Recall  3 3 3  3    Language- name 2 objects  0 0 0  0   Language- repeat  1 1 1 1   Language- follow 3 step command  0 2 3  3    Language- follow 3 step command-comments   unable to follow 1 step of 3 step command    Language- read & follow direction  0 0 0  1   Write a sentence  0 0 0  0   Copy design  0 0 0  0   Total score  17 19 20  26       Data saved with a previous flowsheet row definition        01/21/2024    10:39 AM 12/16/2022   10:19 AM  6CIT Screen  What Year? 0 points 0 points  What month? 0 points 0 points  What time? 0 points 0 points  Count back from 20 0 points 2 points  Months in reverse 0 points 4 points  Repeat phrase 0 points 0 points  Total Score 0 points 6 points    Immunizations Immunization History  Administered Date(s) Administered   Fluad Quad(high Dose 65+) 03/01/2019, 04/09/2022   Influenza Split 04/01/2011, 03/17/2012   Influenza Whole 04/23/2006, 04/02/2007, 03/27/2008, 04/16/2009, 04/23/2010   Influenza, High Dose Seasonal PF 03/23/2020, 04/04/2021, 03/31/2023   Influenza,inj,Quad PF,6+ Mos 03/24/2013, 04/10/2014, 04/13/2015, 03/19/2016, 03/25/2017, 05/06/2018   PFIZER(Purple Top)SARS-COV-2 Vaccination 07/11/2019, 08/01/2019, 03/23/2020   Pfizer Covid-19 Vaccine Bivalent Booster 37yrs & up 04/04/2021   Pfizer(Comirnaty)Fall Seasonal Vaccine 12 years and older 04/09/2022, 03/31/2023   Pneumococcal Conjugate-13 09/11/2014   Pneumococcal Polysaccharide-23 04/16/2009   Td 04/23/2006   Tdap 03/31/2023   Zoster Recombinant(Shingrix) 03/22/2019, 06/10/2019    Screening Tests  Health Maintenance  Topic Date Due   MAMMOGRAM  01/14/2024   COVID-19 Vaccine (7 - 2024-25 season) 01/13/2026 (Originally 09/29/2023)   INFLUENZA VACCINE  01/29/2024   Medicare Annual Wellness (AWV)  01/20/2025   DTaP/Tdap/Td (3 - Td or Tdap) 03/30/2033   Pneumococcal Vaccine: 50+ Years  Completed   DEXA SCAN  Completed   Zoster Vaccines- Shingrix  Completed   Hepatitis B Vaccines  Aged Out   HPV VACCINES  Aged Out   Meningococcal B Vaccine  Aged Out    Health Maintenance  Health Maintenance Due  Topic Date Due   MAMMOGRAM  01/14/2024   Health Maintenance Items Addressed:mammogram scheduled  Additional Screening:  Vision Screening: Recommended annual ophthalmology exams for early detection of glaucoma and other disorders of the eye. Would you like a referral to an eye doctor?  No    Dental Screening: Recommended annual dental exams for proper oral hygiene  Community Resource Referral / Chronic Care Management: CRR required this visit?  No   CCM required this visit?  No   Plan:    I have personally reviewed and noted the following in the patient's chart:   Medical and social history Use of alcohol, tobacco or illicit drugs  Current medications and supplements including opioid prescriptions. Patient is not currently taking opioid prescriptions. Functional ability and status Nutritional status Physical activity Advanced directives List of other physicians Hospitalizations, surgeries, and ER visits in previous 12 months Vitals Screenings to include cognitive, depression, and falls Referrals and appointments  In addition, I have reviewed and discussed with patient certain preventive protocols, quality metrics, and best practice recommendations. A written personalized care plan for preventive services as well as general preventive health recommendations were provided to patient.   Lyle MARLA Right, [place]   01/21/2024   After Visit Summary: (MyChart) Due to this being a telephonic visit, the after visit summary with patients personalized plan was offered to patient via MyChart   Notes: Nothing significant to report at this time.

## 2024-01-21 NOTE — Patient Instructions (Signed)
 [name] , Thank you for taking time out of your busy schedule to complete your Annual Wellness Visit with me. I enjoyed our conversation and look forward to speaking with you again next year. I, as well as your care team,  appreciate your ongoing commitment to your health goals. Please review the following plan we discussed and let me know if I can assist you in the future. Your Game plan/ To Do List    Referrals: If you haven't heard from the office you've been referred to, please reach out to them at the phone provided.  one Follow up Visits: Next Medicare AWV with our clinical staff: 01/24/2025   Have you seen your provider in the last 6 months (3 months if uncontrolled diabetes)? Yes Next Office Visit with your provider: none  Clinician Recommendations:  Aim for 30 minutes of exercise or brisk walking, 6-8 glasses of water, and 5 servings of fruits and vegetables each day.       This is a list of the screening recommended for you and due dates:  Health Maintenance  Topic Date Due   Mammogram  01/14/2024   COVID-19 Vaccine (7 - 2024-25 season) 01/13/2026*   Flu Shot  01/29/2024   Medicare Annual Wellness Visit  01/20/2025   DTaP/Tdap/Td vaccine (3 - Td or Tdap) 03/30/2033   Pneumococcal Vaccine for age over 89  Completed   DEXA scan (bone density measurement)  Completed   Zoster (Shingles) Vaccine  Completed   Hepatitis B Vaccine  Aged Out   HPV Vaccine  Aged Out   Meningitis B Vaccine  Aged Out  *Topic was postponed. The date shown is not the original due date.    Advanced directives: (In Chart) A copy of your advanced directives are scanned into your chart should your provider ever need it. Advance Care Planning is important because it:  [x]  Makes sure you receive the medical care that is consistent with your values, goals, and preferences  [x]  It provides guidance to your family and loved ones and reduces their decisional burden about whether or not they are making the  right decisions based on your wishes.  Follow the link provided in your after visit summary or read over the paperwork we have mailed to you to help you started getting your Advance Directives in place. If you need assistance in completing these, please reach out to us  so that we can help you!  See attachments for Preventive Care and Fall Prevention Tips.

## 2024-01-25 ENCOUNTER — Ambulatory Visit
Admission: RE | Admit: 2024-01-25 | Discharge: 2024-01-25 | Disposition: A | Discharge status: Home / Self Care | Source: Ambulatory Visit | Attending: Family Medicine | Admitting: Family Medicine

## 2024-01-25 DIAGNOSIS — Z1231 Encounter for screening mammogram for malignant neoplasm of breast: Secondary | ICD-10-CM | POA: Diagnosis present

## 2024-01-29 ENCOUNTER — Ambulatory Visit: Payer: Self-pay | Admitting: Family Medicine

## 2024-02-01 ENCOUNTER — Encounter: Payer: Self-pay | Admitting: Podiatry

## 2024-02-01 ENCOUNTER — Ambulatory Visit: Admitting: Podiatry

## 2024-02-01 ENCOUNTER — Telehealth: Payer: Self-pay

## 2024-02-01 DIAGNOSIS — M79675 Pain in left toe(s): Secondary | ICD-10-CM | POA: Diagnosis not present

## 2024-02-01 DIAGNOSIS — M79674 Pain in right toe(s): Secondary | ICD-10-CM | POA: Diagnosis not present

## 2024-02-01 DIAGNOSIS — B351 Tinea unguium: Secondary | ICD-10-CM | POA: Diagnosis not present

## 2024-02-01 NOTE — Telephone Encounter (Signed)
 Called patient she was wanting her mammogram results I have reviewed with her. She will call if any questions.

## 2024-02-01 NOTE — Telephone Encounter (Signed)
 Copied from CRM [id number]. Topic: Clinical - Lab/Test Results >> Feb 01, 2024 11:14 AM [place] J wrote: Reason for CRM: Patient is needing a call back with her imaging results.

## 2024-02-01 NOTE — Progress Notes (Signed)
  Subjective:  Patient ID: Linda Johnson, female    DOB: Jul 30, 1935,  MRN: [id number]  Linda Johnson presents to clinic today for painful thick toenails that are difficult to trim. Pain interferes with ambulation. Aggravating factors include wearing enclosed shoe gear. Pain is relieved with periodic professional debridement.  Chief Complaint  Patient presents with   rfc    Pt is here to have her nails trimmed  PCP is Tower, Laine LABOR, MD No changes since last visit    New problem(s): None.   PCP is Tower, Laine LABOR, MD. Linda Johnson 12/29/2023.  Allergies  Allergen Reactions   Ace Inhibitors Cough        Hydrocodone  Bit-Homatrop Mbr Nausea Only   Tramadol Hcl Nausea Only        Review of Systems: Negative except as noted in the HPI.  Objective: No changes noted in today's physical examination. There were no vitals filed for this visit. Linda Johnson is a pleasant 88 y.o. female WD, WN in NAD. AAO x 3.  Vascular Examination: Capillary refill time immediate b/l. Vascular status intact b/l with palpable pedal pulses. Pedal hair present b/l. No pain with calf compression b/l. Skin temperature gradient WNL b/l. No cyanosis or clubbing b/l. No ischemia or gangrene noted b/l. No edema noted b/l LE.  Neurological Examination: Sensation grossly intact b/l with 10 gram monofilament. Vibratory sensation intact b/l.   Dermatological Examination: Pedal skin with normal turgor, texture and tone b/l.  No open wounds. No interdigital macerations.   Evidence of total matrixectomy bilateral great toes.Toenails 2-5 b/l thick, discolored, elongated with subungual debris and pain on dorsal palpation.   No corns, calluses, nor porokeratotic lesions.  Musculoskeletal Examination: Muscle strength 5/5 to all lower extremity muscle groups bilaterally. No pain, crepitus or joint limitation noted with ROM bilateral LE. No gross bony deformities bilaterally.  Radiographs: None  Last A1c:      Latest Ref Rng  & Units 12/29/2023   11:34 AM 07/16/2023   10:43 AM  Hemoglobin A1C  Hemoglobin-A1c 4.6 - 6.5 % 5.9  5.7    Assessment/Plan: 1. Pain due to onychomycosis of toenails of both feet   Patient was evaluated and treated. All patient's and/or POA's questions/concerns addressed on today's visit. Toenails 1-5 debrided in length and girth without incident. Continue soft, supportive shoe gear daily. Report any pedal injuries to medical professional. Call office if there are any questions/concerns. -Patient/POA to call should there be question/concern in the interim.   Return in about 3 months (around 05/03/2024).  Linda Johnson, DPM      [facility] LOCATION: [address], [place] [address]                   Office [phone]   [facility] LOCATION: [address] [place], [place] [address] Office [phone]

## 2024-03-04 ENCOUNTER — Telehealth: Payer: Self-pay | Admitting: Pharmacy Technician

## 2024-03-04 ENCOUNTER — Other Ambulatory Visit (HOSPITAL_COMMUNITY): Payer: Self-pay

## 2024-03-04 ENCOUNTER — Other Ambulatory Visit: Payer: Self-pay | Admitting: Cardiovascular Disease

## 2024-03-04 ENCOUNTER — Ambulatory Visit: Payer: Self-pay

## 2024-03-04 DIAGNOSIS — I482 Chronic atrial fibrillation, unspecified: Secondary | ICD-10-CM

## 2024-03-04 MED ORDER — APIXABAN 5 MG PO TABS
5.0000 mg | ORAL_TABLET | Freq: Two times a day (BID) | ORAL | 3 refills | Status: DC
Start: 2024-03-04 — End: 2024-05-19

## 2024-03-04 NOTE — Telephone Encounter (Signed)
 FYI Only or Action Required?: FYI only for provider.  Patient was last seen in primary care on 12/29/2023 by [name] LABOR, MD.  Called Nurse Triage reporting Back Pain.  Symptoms began a week ago.  Interventions attempted: OTC medications: tylenol .  Symptoms are: unchanged.  Triage Disposition: See PCP When Office is Open (Within 3 Days)  Patient/caregiver understands and will follow disposition?: Yes     Copied from CRM #[id number]. Topic: Clinical - Red Word Triage >> Mar 04, 2024 12:02 PM Dedra B wrote: Red Word that prompted transfer to Nurse Triage: Pt is having lower back pain for the past week. Hurts when she gets up to walk. Warm transfer to nurse triage. Reason for Disposition  [1] MODERATE back pain (e.g., interferes with normal activities) AND [2] present > 3 days  Answer Assessment - Initial Assessment Questions 1. ONSET: When did the pain begin? (e.g., minutes, hours, days)     Week ago 2. LOCATION: Where does it hurt? (upper, mid or lower back)     Lower back 3. SEVERITY: How bad is the pain?  (e.g., Scale 1-10; mild, moderate, or severe)     mild 4. PATTERN: Is the pain constant? (e.g., yes, no; constant, intermittent)      constant 5. RADIATION: Does the pain shoot into your legs or somewhere else?     States hurts when she walks and goes to the left hip and leg.  6. CAUSE:  What do you think is causing the back pain?      denies 7. BACK OVERUSE:  Any recent lifting of heavy objects, strenuous work or exercise?     denies 8. MEDICINES: What have you taken so far for the pain? (e.g., nothing, acetaminophen , NSAIDS)     Tylenol  9. NEUROLOGIC SYMPTOMS: Do you have any weakness, numbness, or problems with bowel/bladder control?     denies 10. OTHER SYMPTOMS: Do you have any other symptoms? (e.g., fever, abdomen pain, burning with urination, blood in urine)       no 11. PREGNANCY: Is there any chance you are pregnant? When was your last  menstrual period?       na  Protocols used: Back Pain-A-AH

## 2024-03-04 NOTE — Telephone Encounter (Addendum)
 Hello, can someone please send in a refill for this patient's eliquis  for 90 days? This grant goes with that prescription. I gave [facility] this grant information. Thank you!   Patient Advocate Encounter   The patient was approved for a [employer] grant that will help cover the cost of eliquis  Total amount awarded, 7500.  Effective: 02/03/24 - 02/01/25   APW:[id number] ERW:[id number] Hmnle:[id number] PI:[id number] [employer] ID: [id number]   Pharmacy provided with approval and processing information. Patient informed via telephone   Eliquis  copay was 442.18 for 90 days

## 2024-03-04 NOTE — Telephone Encounter (Addendum)
 [facility]. Also the patient is asking for someone to call her to help her schedule an appt please   [id number], [place] - [address]  [address] [place] [address]  Phone: [phone] Fax: [phone]  Hours: Not open 24 hours

## 2024-03-04 NOTE — Telephone Encounter (Signed)
 Spoke with patient and she wanted to schedule appointment for November with Dr. Gollan. Appointment scheduled and she had no further needs.

## 2024-03-04 NOTE — Addendum Note (Signed)
 Addended by: DASIE SHARLET RAMAN on: 03/04/2024 11:10 AM   Modules accepted: Orders

## 2024-03-04 NOTE — Addendum Note (Signed)
 Addended by: DASIE SHARLET RAMAN on: 03/04/2024 11:29 AM   Modules accepted: Orders

## 2024-03-04 NOTE — Telephone Encounter (Signed)
 Aware, will watch for correspondence Thanks for seeing her

## 2024-03-05 ENCOUNTER — Emergency Department

## 2024-03-05 ENCOUNTER — Emergency Department
Admission: EM | Admit: 2024-03-05 | Discharge: 2024-03-05 | Disposition: A | Discharge status: Home / Self Care | Attending: Emergency Medicine | Admitting: Emergency Medicine

## 2024-03-05 DIAGNOSIS — I509 Heart failure, unspecified: Secondary | ICD-10-CM | POA: Diagnosis not present

## 2024-03-05 DIAGNOSIS — M545 Low back pain, unspecified: Secondary | ICD-10-CM | POA: Insufficient documentation

## 2024-03-05 DIAGNOSIS — M25552 Pain in left hip: Secondary | ICD-10-CM | POA: Insufficient documentation

## 2024-03-05 DIAGNOSIS — I110 Hypertensive heart disease with heart failure: Secondary | ICD-10-CM | POA: Insufficient documentation

## 2024-03-05 MED ORDER — OXYCODONE-ACETAMINOPHEN 5-325 MG PO TABS
1.0000 | ORAL_TABLET | Freq: Once | ORAL | Status: AC
  Administered 2024-03-05: 1 via ORAL
  Filled 2024-03-05: qty 1

## 2024-03-05 MED ORDER — OXYCODONE-ACETAMINOPHEN 5-325 MG PO TABS: 1.0000 | ORAL_TABLET | Freq: Four times a day (QID) | ORAL | 0 refills | Status: AC | PRN

## 2024-03-05 NOTE — ED Provider Notes (Signed)
 [facility] Provider Note    Event Date/Time   First MD Initiated Contact with Patient 03/05/24 1427     (approximate)   History   Back Pain   HPI  Linda Johnson is a 88 year old female with history of hypertension, A-fib, CHF presenting to the emergency department for evaluation of back pain and hip pain.  Patient reports that for the past few weeks she has had pain in her lower back and hip when walking.  Still able to ambulate with a walker.  Has been using Tylenol  and gabapentin  from a prior prescription with limited benefit.  No recent falls.  No numbness, tingling, focal weakness.  No bowel or bladder symptoms.     Physical Exam   Triage Vital Signs: ED Triage Vitals  Encounter Vitals Group     BP 03/05/24 1401 (!) 164/66     Girls Systolic BP Percentile --      Girls Diastolic BP Percentile --      Boys Systolic BP Percentile --      Boys Diastolic BP Percentile --      Pulse Rate 03/05/24 1401 (!) 105     Resp 03/05/24 1401 18     Temp 03/05/24 1401 98 F (36.7 C)     Temp Source 03/05/24 1401 Oral     SpO2 03/05/24 1401 98 %     Weight --      Height --      Head Circumference --      Peak Flow --      Pain Score 03/05/24 1402 6     Pain Loc --      Pain Education --      Exclude from Growth Chart --     Most recent vital signs: Vitals:   03/05/24 1401 03/05/24 1638  BP: (!) 164/66   Pulse: (!) 105 73  Resp: 18 18  Temp: 98 F (36.7 C)   SpO2: 98%      General: Awake, interactive  CV:  Regular rate at the time of my evaluation, good peripheral perfusion.  Resp:  Unlabored respirations.  Abd:  Nondistended.  Neuro:  Symmetric facial movement, fluid speech MSK:  No focal midline tenderness.  There is tenderness throughout the lower back into the left hip.  5 out of 5 strength of the bilateral lower extremities with normal sensation.   ED Results / Procedures / Treatments   Labs (all labs ordered are listed, but only  abnormal results are displayed) Labs Reviewed - No data to display   EKG EKG independently reviewed and interpreted by myself demonstrates:    RADIOLOGY Imaging independently reviewed and interpreted by myself demonstrates:  Hip x-Mikell Camp without acute fracture, degenerative changes noted Lumbar x-Varsha Knock without acute fracture.  Previously noted endplate deformity slightly increased at L2  Formal Radiology Read:  DG Hip Unilat W or Wo Pelvis 2-3 Views Left Result Date: 03/05/2024 CLINICAL DATA:  Hip pain. EXAM: DG HIP (WITH OR WITHOUT PELVIS) 2-3V LEFT COMPARISON:  None Available. FINDINGS: There is diffusely decreased mineralization of the bones. No acute fracture or dislocation is seen. Mild degenerative changes are noted at the hips bilaterally. Degenerative changes are present in the lower lumbar spine. IMPRESSION: No acute fracture or dislocation. Electronically Signed   By: Leita Birmingham M.D.   On: 03/05/2024 16:12   DG Lumbar Spine 2-3 Views Result Date: 03/05/2024 CLINICAL DATA:  One-month history of low back pain EXAM: LUMBAR SPINE -  3 VIEW COMPARISON:  CT abdomen and pelvis dated 08/25/2018, lumbar spine radiograph dated 03/24/2013 FINDINGS: Slightly increased superior endplate compression at L2 compared to 08/25/2018. Unchanged grade 1 anterolisthesis at L4-5 and L5-S1. Similar severe intervertebral disc space narrowing at L1-L4. IMPRESSION: 1. Slightly increased superior endplate compression at L2 compared to 08/25/2018. 2. Similar severe intervertebral disc space narrowing at L1-L4. Electronically Signed   By: Limin  Xu M.D.   On: 03/05/2024 14:38    PROCEDURES:  Critical Care performed: No  Procedures   MEDICATIONS ORDERED IN ED: Medications  oxyCODONE -acetaminophen  (PERCOCET/ROXICET) 5-325 MG per tablet 1 tablet (1 tablet Oral Given 03/05/24 1617)     IMPRESSION / MDM / ASSESSMENT AND PLAN / ED COURSE  I reviewed the triage vital signs and the nursing notes.  Differential  diagnosis includes, but is not limited to, musculoskeletal pain, fracture, soft tissue injury, no bowel or bladder symptoms or other clinical history suggestive of acute spinal cord compression or cauda equina  Patient's presentation is most consistent with acute complicated illness / injury requiring diagnostic workup.  88 year old female presenting to the emergency department for evaluation of back pain.  X-Tyerra Loretto of the lumbar spine ordered from triage demonstrating degenerative changes as well as an area of endplate deformity compression slightly increased from prior imaging from 5 years ago.  No recent trauma, low suspicion for acute fracture, but may be contributing to patient's pain.  Does have left hip pain, x-Kayode Petion ordered without acute findings.  Did have mild tachycardia on presentation, improved on reevaluation.  Known persistent A-fib.  Patient did have improvement with pain medication here.  I discussed that this is not a good option for long-term pain control.  Patient does have a follow-up with her primary care doctor on Monday.  Will DC with short course of pain medication given worsening pain with fracture noted on x-Trystyn Dolley.  Strict return precautions provided.  Patient discharged in stable condition.      FINAL CLINICAL IMPRESSION(S) / ED DIAGNOSES   Final diagnoses:  Midline low back pain without sciatica, unspecified chronicity  Left hip pain     Rx / DC Orders   ED Discharge Orders     None        Note:  This document was prepared using Dragon voice recognition software and may include unintentional dictation errors.   Levander Slate, MD 03/05/24 [phone]

## 2024-03-05 NOTE — ED Triage Notes (Addendum)
 Pt to ED via POV from home. Pt reports lower back pain x1 month. Pt reports hard to ambulate due to pain. Pain was worse this morning. No falls. Pt denies urinary symptoms, abd pain or N/V/D.   Pt with hx of afib - pulse ox alerting HR 150s for about 30secs. EKG obtain to ensure pt not in afib RVR. Pt denies CP or SOB. EDP made aware. EDP reporting no blood work needed at this time

## 2024-03-05 NOTE — Discharge Instructions (Signed)
 You are seen in the ER today for your back pain.  Your x-Yassmine Tamm showed that you have degenerative changes as well as an area of an old compression fracture that has slightly progressed.  Keep your scheduled follow-up with your primary care doctor on Monday. I have included information for spine specialist if they recommend that as your next.  Return to the ER for new or worsening symptoms.  I have sent a prescription for a narcotic pain medicine for severe breathrough pain. Do not drink alcohol, drive or participate in any other potentially dangerous activities while taking this medication as it may make you sleepy. Do not take this medication with any other sedating medications, either prescription or over-the-counter.  This medication is intended for your use only - do not give any to anyone else and keep it in a secure place where nobody else, especially children, have access to it.  It can also cause or worsen constipation, so you may want to consider taking an over-the-counter stool softener while you are taking this medication.

## 2024-03-07 ENCOUNTER — Ambulatory Visit: Admitting: General Practice

## 2024-03-07 ENCOUNTER — Ambulatory Visit: Payer: Self-pay | Admitting: General Practice

## 2024-03-07 ENCOUNTER — Encounter: Payer: Self-pay | Admitting: General Practice

## 2024-03-07 VITALS — BP 134/78 | HR 85 | Temp 97.6°F | Ht 63.0 in | Wt 166.0 lb

## 2024-03-07 DIAGNOSIS — R300 Dysuria: Secondary | ICD-10-CM

## 2024-03-07 DIAGNOSIS — M5442 Lumbago with sciatica, left side: Secondary | ICD-10-CM

## 2024-03-07 DIAGNOSIS — G8929 Other chronic pain: Secondary | ICD-10-CM

## 2024-03-07 LAB — POC URINALSYSI DIPSTICK (AUTOMATED)
Bilirubin, UA: NEGATIVE
Blood, UA: 80
Glucose, UA: NEGATIVE
Ketones, UA: NEGATIVE
Nitrite, UA: NEGATIVE
Protein, UA: NEGATIVE
Spec Grav, UA: 1.015 (ref 1.010–1.025)
Urobilinogen, UA: 0.2 U/dL
pH, UA: 5.5 (ref 5.0–8.0)

## 2024-03-07 MED ORDER — GABAPENTIN 100 MG PO CAPS: 100.0000 mg | ORAL_CAPSULE | Freq: Two times a day (BID) | ORAL | 0 refills | Status: DC

## 2024-03-07 NOTE — Patient Instructions (Addendum)
 Stop by the lab prior to leaving today. I will notify you of your results once received.   Stop Oxycodone .   Start Gabapentin  100 mg twice daily (one in the morning and one in the evening).   I placed the order for the wheelchair. Someone will reach out to you.   F/u in 2 weeks with Dr. Randeen.   It was a pleasure meeting you!

## 2024-03-07 NOTE — Progress Notes (Signed)
 Established Patient Office Visit  Subjective   Patient ID: Linda Johnson, female    DOB: June 18, 1936  Age: 88 y.o. MRN: [id number]  Chief Complaint  Patient presents with   Back Pain    X 2 weeks; Went to ED on Saturday and has old compression fractures and osteoporosis/osteoarthritis. Patient has been taking oxycodone  and has been helping but also making her sick and would like something else for pain. Also wants to discuss getting wheelchair and elavated bed.     Back Pain Associated symptoms include dysuria. Pertinent negatives include no abdominal pain, chest pain, fever or headaches.    Linda Johnson is a 88 year old female, patient of Dr. Randeen presents today for an acute visit.   Discussed the use of AI scribe software for clinical note transcription with the patient, who gave verbal consent to proceed.  History of Present Illness Linda Johnson is an 88 year old female with osteoporosis who presents with lower back and left hip pain. She is accompanied by her daughter, Olam.  She experiences lower back pain radiating to her left hip and down to her knee, exacerbated by walking and alleviated by sitting. No numbness, tingling, or radiation to the ankle. She uses a walker for mobility due to pain and osteoporosis.  She visited the ER on March 05, 2024, for this pain. Her back pain has been a long-standing issue, with similar x-ray findings since 2020.   She uses Tylenol  for pain relief, which helps when sitting, and has previously used gabapentin , which she found effective. She has not taken gabapentin  regularly for five years. She also took Percocet recently, which caused nausea.   She reports a new onset of burning during urination starting today, with no associated fever, chills, or urinary incontinence. She self-treated with over-the-counter Azo for suspected bladder infection two to three weeks ago, which initially helped but then became ineffective.  No recent falls or  trauma. No chest pain, shortness of breath, dizziness, headaches, or diarrhea.    Patient Active Problem List   Diagnosis Date Noted   Enlarged and hypertrophic nails 12/23/2022   CKD stage 3a, GFR 45-59 ml/min (HCC) 12/23/2022   History of COVID-19 11/23/2020   Chronic diastolic CHF (congestive heart failure) (HCC) 08/30/2018   Pedal edema 08/23/2018   PVC (premature ventricular contraction) 01/18/2018   Poor balance 10/05/2017   Post herpetic neuralgia 09/23/2017   Encounter for anticoagulation discussion and counseling 09/20/2015   Irregular heart rate 09/17/2015   Permanent atrial fibrillation (HCC) 09/17/2015   Hearing loss 09/17/2015   Hypothyroidism 12/12/2014   Prediabetes 09/11/2014   Estrogen deficiency 09/11/2014   Routine general medical examination at a health care facility 09/09/2013   Low back pain 03/21/2013   Other screening mammogram 04/19/2012   Post-menopausal 04/19/2012   Obesity 08/18/2011   Hyperlipidemia 06/12/2010   Osteoporosis 06/12/2010   Allergic rhinitis 04/12/2008   History of colonic polyps 04/02/2007   Disorder of eye movements 03/02/2007   Essential hypertension 03/02/2007   Past Medical History:  Diagnosis Date   Arthritis    hips, knees   Arthritis    Atrial fibrillation (HCC)    chronic   Back pain    Balance problem    Cardiomegaly    Cataract 2020   Right eye December 06, 2018, Left eye date pending   CHF (congestive heart failure) (HCC) 07/2018   chronic   Hypertension    Osteopenia    Pleural effusion 08/25/2018  [facility]   Shingles 08/2017   H/O   SOB (shortness of breath)    Past Surgical History:  Procedure Laterality Date   BREAST BIOPSY Left 2011   2 areas, cysts per pt   CATARACT EXTRACTION W/PHACO Right 12/06/2018   Procedure: CATARACT EXTRACTION PHACO AND INTRAOCULAR LENS PLACEMENT (IOC) RIGHT;  Surgeon: Myrna Adine Anes, MD;  Location: [facility];  Service: Ophthalmology;  Laterality: Right;   CATARACT  EXTRACTION W/PHACO Left 01/17/2019   Procedure: CATARACT EXTRACTION PHACO AND INTRAOCULAR LENS PLACEMENT (IOC) LEFT;  Surgeon: Myrna Adine Anes, MD;  Location: [facility];  Service: Ophthalmology;  Laterality: Left;   Allergies  Allergen Reactions   Ace Inhibitors Cough        Hydrocodone  Bit-Homatrop Mbr Nausea Only   Tramadol Hcl Nausea Only              03/07/2024    2:54 PM 01/21/2024   10:35 AM 04/06/2023   11:56 AM  Depression screen PHQ 2/9  Decreased Interest 0 0 0  Down, Depressed, Hopeless 0 0 0  PHQ - 2 Score 0 0 0  Altered sleeping 0 0 0  Tired, decreased energy 0 0 0  Change in appetite 0 0 0  Feeling bad or failure about yourself  0 0 0  Trouble concentrating 0 0 0  Moving slowly or fidgety/restless 0 0 0  Suicidal thoughts 0 0 0  PHQ-9 Score 0 0 0  Difficult doing work/chores Not difficult at all Not difficult at all Not difficult at all       03/07/2024    2:54 PM 04/06/2023   11:56 AM  GAD 7 : Generalized Anxiety Score  Nervous, Anxious, on Edge 0 0  Control/stop worrying 0 0  Worry too much - different things 0 0  Trouble relaxing 0 0  Restless 0 0  Easily annoyed or irritable 1 0  Afraid - awful might happen 0 0  Total GAD 7 Score 1 0  Anxiety Difficulty Not difficult at all Not difficult at all      Review of Systems  Constitutional:  Negative for chills and fever.  Respiratory:  Negative for shortness of breath.   Cardiovascular:  Negative for chest pain.  Gastrointestinal:  Positive for nausea. Negative for abdominal pain, constipation, diarrhea, heartburn and vomiting.  Genitourinary:  Positive for dysuria, flank pain and frequency. Negative for urgency.  Musculoskeletal:  Positive for back pain.  Neurological:  Negative for dizziness and headaches.  Endo/Heme/Allergies:  Negative for polydipsia.  Psychiatric/Behavioral:  Negative for depression and suicidal ideas. The patient is not nervous/anxious.       Objective:     BP  134/78   Pulse 85   Temp 97.6 F (36.4 C) (Oral)   Ht 5' 3 (1.6 m)   Wt 166 lb (75.3 kg)   LMP  (LMP Unknown)   SpO2 97%   BMI 29.41 kg/m  BP Readings from Last 3 Encounters:  03/07/24 134/78  03/05/24 (!) 164/66  01/21/24 134/78   Wt Readings from Last 3 Encounters:  03/07/24 166 lb (75.3 kg)  01/21/24 170 lb (77.1 kg)  12/29/23 170 lb (77.1 kg)      Physical Exam Vitals and nursing note reviewed.  Constitutional:      Appearance: Normal appearance.  Cardiovascular:     Rate and Rhythm: Normal rate and regular rhythm.     Pulses: Normal pulses.     Heart sounds: Normal heart sounds.  Pulmonary:     Effort: Pulmonary effort is normal.     Breath sounds: Normal breath sounds.  Neurological:     Mental Status: She is alert and oriented to person, place, and time.  Psychiatric:        Mood and Affect: Mood normal.        Behavior: Behavior normal.        Thought Content: Thought content normal.        Judgment: Judgment normal.      Results for orders placed or performed in visit on 03/07/24  POCT Urinalysis Dipstick (Automated)  Result Value Ref Range   Color, UA light yellow    Clarity, UA clear    Glucose, UA Negative Negative   Bilirubin, UA neg    Ketones, UA neg    Spec Grav, UA 1.015 1.010 - 1.025   Blood, UA 80 Ery/uL    pH, UA 5.5 5.0 - 8.0   Protein, UA Negative Negative   Urobilinogen, UA 0.2 0.2 or 1.0 E.U./dL   Nitrite, UA neg    Leukocytes, UA Small (1+) (A) Negative       The ASCVD Risk score (Arnett DK, et al., 2019) failed to calculate for the following reasons:   The 2019 ASCVD risk score is only valid for ages 28 to 34    Assessment & Plan:  Chronic midline low back pain with left-sided sciatica -     Gabapentin ; Take 1 capsule (100 mg total) by mouth 2 (two) times daily.  Dispense: 60 capsule; Refill: 0 -     For home use only DME Other see comment  Dysuria -     POCT Urinalysis Dipstick (Automated) -     Urine Culture -      CBC with Differential/Platelet -     Basic metabolic panel with GFR     Assessment and Plan Assessment & Plan Low back pain with left-sided sciatica Chronic low back pain with radiation to the left knee, consistent with sciatica.  - discontinue percocet.  - Start gabapentin  100 mg, one tablet in the morning and one in the evening. Rx sent.  - Take tylenol  as needed. - follow up with PCP in two weeks. - Order placed for wheelchair.  Dysuria Acute onset of dysuria without systemic symptoms. Previous Azo use ineffective. - POC UA shows 1+ leuks, small amount of blood, negative for nitrites.  - Urine culture pending. - CBC with diff and BMP pending. - discussed the importance of increasing fluid intake.   Osteoporosis Osteoporosis with increased fracture risk. - Emphasize importance of fall prevention measures.    Return in about 2 weeks (around 03/21/2024) for back pain.    Carrol Aurora, NP

## 2024-03-08 LAB — CBC WITH DIFFERENTIAL/PLATELET
Basophils Absolute: 0.1 K/uL (ref 0.0–0.1)
Basophils Relative: 0.8 % (ref 0.0–3.0)
Eosinophils Absolute: 0.1 K/uL (ref 0.0–0.7)
Eosinophils Relative: 1.3 % (ref 0.0–5.0)
HCT: 40 % (ref 36.0–46.0)
Hemoglobin: 13.5 g/dL (ref 12.0–15.0)
Lymphocytes Relative: 20 % (ref 12.0–46.0)
Lymphs Abs: 1.4 K/uL (ref 0.7–4.0)
MCHC: 33.8 g/dL (ref 30.0–36.0)
MCV: 94 fl (ref 78.0–100.0)
Monocytes Absolute: 0.2 K/uL (ref 0.1–1.0)
Monocytes Relative: 2.7 % — ABNORMAL LOW (ref 3.0–12.0)
Neutro Abs: 5.1 K/uL (ref 1.4–7.7)
Neutrophils Relative %: 75.2 % (ref 43.0–77.0)
Platelets: 211 K/uL (ref 150.0–400.0)
RBC: 4.25 Mil/uL (ref 3.87–5.11)
RDW: 13.6 % (ref 11.5–15.5)
WBC: 6.8 K/uL (ref 4.0–10.5)

## 2024-03-08 LAB — BASIC METABOLIC PANEL WITH GFR
BUN: 27 mg/dL — ABNORMAL HIGH (ref 6–23)
CO2: 33 meq/L — ABNORMAL HIGH (ref 19–32)
Calcium: 9.5 mg/dL (ref 8.4–10.5)
Chloride: 99 meq/L (ref 96–112)
Creatinine, Ser: 1.22 mg/dL — ABNORMAL HIGH (ref 0.40–1.20)
GFR: 40.35 mL/min — ABNORMAL LOW (ref >60.00)
Glucose, Bld: 114 mg/dL — ABNORMAL HIGH (ref 70–99)
Potassium: 4.6 meq/L (ref 3.5–5.1)
Sodium: 141 meq/L (ref 135–145)

## 2024-03-08 LAB — URINE CULTURE
MICRO NUMBER:: 16936610
Result:: NO GROWTH
SPECIMEN QUALITY:: ADEQUATE

## 2024-03-21 ENCOUNTER — Ambulatory Visit: Admitting: Family Medicine

## 2024-03-21 ENCOUNTER — Encounter: Payer: Self-pay | Admitting: Family Medicine

## 2024-03-21 VITALS — BP 132/72 | HR 79 | Temp 97.6°F | Ht 63.0 in | Wt 170.4 lb

## 2024-03-21 DIAGNOSIS — Z23 Encounter for immunization: Secondary | ICD-10-CM | POA: Diagnosis not present

## 2024-03-21 DIAGNOSIS — M5442 Lumbago with sciatica, left side: Secondary | ICD-10-CM

## 2024-03-21 DIAGNOSIS — M810 Age-related osteoporosis without current pathological fracture: Secondary | ICD-10-CM | POA: Diagnosis not present

## 2024-03-21 DIAGNOSIS — M519 Unspecified thoracic, thoracolumbar and lumbosacral intervertebral disc disorder: Secondary | ICD-10-CM | POA: Insufficient documentation

## 2024-03-21 DIAGNOSIS — G8929 Other chronic pain: Secondary | ICD-10-CM | POA: Diagnosis not present

## 2024-03-21 MED ORDER — GABAPENTIN 100 MG PO CAPS: 100.0000 mg | ORAL_CAPSULE | Freq: Two times a day (BID) | ORAL | 1 refills | Status: DC

## 2024-03-21 NOTE — Assessment & Plan Note (Addendum)
 Continues alendronate   Recent LS film noted old L2 endplate compression there since 2020 , may have progressed  Her pain tender to be lower

## 2024-03-21 NOTE — Progress Notes (Signed)
 Subjective:    Patient ID: Linda Johnson, female    DOB: [birth year], 88 y.o.   MRN: [id number]  HPI  Wt Readings from Last 3 Encounters:  03/21/24 170 lb 6.4 oz (77.3 kg)  03/07/24 166 lb (75.3 kg)  01/21/24 170 lb (77.1 kg)   30.19 kg/m  Vitals:   03/21/24 1135  BP: 132/72  Pulse: 79  Temp: 97.6 F (36.4 C)  SpO2: 95%    Pt presents for  Back pain  Flu shot    Was seen on 03/05/24 at [facility] ER for back pain and hip pain  C/o low back pain and hip area pain  This had been going on for 2 weeks or longer    Imaging done  RADIOLOGY Imaging independently reviewed and interpreted by myself demonstrates:  Hip x-ray without acute fracture, degenerative changes noted Lumbar x-ray without acute fracture.  Previously noted endplate deformity slightly increased at L2   Formal Radiology Read:  DG Hip Unilat W or Wo Pelvis 2-3 Views Left Result Date: 03/05/2024 CLINICAL DATA:  Hip pain. EXAM: DG HIP (WITH OR WITHOUT PELVIS) 2-3V LEFT COMPARISON:  None Available. FINDINGS: There is diffusely decreased mineralization of the bones. No acute fracture or dislocation is seen. Mild degenerative changes are noted at the hips bilaterally. Degenerative changes are present in the lower lumbar spine. IMPRESSION: No acute fracture or dislocation. Electronically Signed   By: Leita Birmingham M.D.   On: 03/05/2024 16:12    DG Lumbar Spine 2-3 Views Result Date: 03/05/2024 CLINICAL DATA:  One-month history of low back pain EXAM: LUMBAR SPINE - 3 VIEW COMPARISON:  CT abdomen and pelvis dated 08/25/2018, lumbar spine radiograph dated 03/24/2013 FINDINGS: Slightly increased superior endplate compression at L2 compared to 08/25/2018. Unchanged grade 1 anterolisthesis at L4-5 and L5-S1. Similar severe intervertebral disc space narrowing at L1-L4. IMPRESSION: 1. Slightly increased superior endplate compression at L2 compared to 08/25/2018. 2. Similar severe intervertebral disc space narrowing at L1-L4.  Electronically Signed   By: Limin  Xu M.D.   On: 03/05/2024 14:38   Was given short course of oxycodone -was using walker at the time -caused nausea   Then had visit with NP Vincente on 9/8 Also had urinary symptoms at the time    From that vist Assessment & Plan Low back pain with left-sided sciatica Chronic low back pain with radiation to the left knee, consistent with sciatica.  - discontinue percocet.  - Start gabapentin  100 mg, one tablet in the morning and one in the evening. Rx sent.  - Take tylenol  as needed. - follow up with PCP in two weeks. - Order placed for wheelchair.   Some improvement  Tolerating gabapentin    No falls  Has osteoporosis  Taking alendronate     Lab Results  Component Value Date   NA 141 03/07/2024   K 4.6 03/07/2024   CO2 33 (H) 03/07/2024   GLUCOSE 114 (H) 03/07/2024   BUN 27 (H) 03/07/2024   CREATININE 1.22 (H) 03/07/2024   CALCIUM  9.5 03/07/2024   GFR 40.35 (L) 03/07/2024   EGFR 59 (L) 09/23/2021   GFRNONAA 45 (L) 08/26/2018       Dysuria Acute onset of dysuria without systemic symptoms. Previous Azo use ineffective. - POC UA shows 1+ leuks, small amount of blood, negative for nitrites.  - Urine culture pending. - CBC with diff and BMP pending. - discussed the importance of increasing fluid intake  Urine culture did not show bacteria  Patient Active Problem List   Diagnosis Date Noted   Lumbar disc disease 03/21/2024   Enlarged and hypertrophic nails 12/23/2022   CKD stage 3a, GFR 45-59 ml/min (HCC) 12/23/2022   History of COVID-19 11/23/2020   Chronic diastolic CHF (congestive heart failure) (HCC) 08/30/2018   Pedal edema 08/23/2018   PVC (premature ventricular contraction) 01/18/2018   Poor balance 10/05/2017   Post herpetic neuralgia 09/23/2017   Encounter for anticoagulation discussion and counseling 09/20/2015   Irregular heart rate 09/17/2015   Permanent atrial fibrillation (HCC) 09/17/2015   Hearing loss  09/17/2015   Hypothyroidism 12/12/2014   Prediabetes 09/11/2014   Estrogen deficiency 09/11/2014   Routine general medical examination at a health care facility 09/09/2013   Low back pain 03/21/2013   Other screening mammogram 04/19/2012   Post-menopausal 04/19/2012   Obesity 08/18/2011   Hyperlipidemia 06/12/2010   Osteoporosis 06/12/2010   Allergic rhinitis 04/12/2008   History of colonic polyps 04/02/2007   Disorder of eye movements 03/02/2007   Essential hypertension 03/02/2007   Past Medical History:  Diagnosis Date   Arthritis    hips, knees   Arthritis    Atrial fibrillation (HCC)    chronic   Back pain    Balance problem    Cardiomegaly    Cataract 2020   Right eye December 06, 2018, Left eye date pending   CHF (congestive heart failure) (HCC) 07/2018   chronic   Hypertension    Osteopenia    Pleural effusion 08/25/2018   [facility]   Shingles 08/2017   H/O   SOB (shortness of breath)    Past Surgical History:  Procedure Laterality Date   BREAST BIOPSY Left 2011   2 areas, cysts per pt   CATARACT EXTRACTION W/PHACO Right 12/06/2018   Procedure: CATARACT EXTRACTION PHACO AND INTRAOCULAR LENS PLACEMENT (IOC) RIGHT;  Surgeon: Myrna Adine Anes, MD;  Location: [facility];  Service: Ophthalmology;  Laterality: Right;   CATARACT EXTRACTION W/PHACO Left 01/17/2019   Procedure: CATARACT EXTRACTION PHACO AND INTRAOCULAR LENS PLACEMENT (IOC) LEFT;  Surgeon: Myrna Adine Anes, MD;  Location: [facility];  Service: Ophthalmology;  Laterality: Left;   Social History   Tobacco Use   Smoking status: Never   Smokeless tobacco: Never  Vaping Use   Vaping status: Never Used  Substance Use Topics   Alcohol use: No    Alcohol/week: 0.0 standard drinks of alcohol   Drug use: No   Family History  Problem Relation Age of Onset   Leukemia Sister    Breast cancer Sister 46   Breast cancer Maternal Aunt 50   Allergies  Allergen Reactions   Ace Inhibitors Cough         Hydrocodone  Bit-Homatrop Mbr Nausea Only   Tramadol Hcl Nausea Only        Current Outpatient Medications on File Prior to Visit  Medication Sig Dispense Refill   alendronate  (FOSAMAX ) 70 MG tablet Take 1 tablet (70 mg total) by mouth every 7 (seven) days. Take with a full glass of water on an empty stomach. 12 tablet 3   apixaban  (ELIQUIS ) 5 MG TABS tablet Take 1 tablet (5 mg total) by mouth 2 (two) times daily. 180 tablet 3   diltiazem  (CARDIZEM  CD) 120 MG 24 hr capsule TAKE 1 CAPSULE BY MOUTH EVERY DAY 90 capsule 0   doxazosin  (CARDURA ) 8 MG tablet TAKE 1/2 TABLET (4 MG TOTAL) BY MOUTH 2 (TWO) TIMES DAILY. 90 tablet 3   furosemide  (LASIX ) 20  MG tablet Take 1 tablet (20 mg total) by mouth daily. 90 tablet 2   latanoprost  (XALATAN ) 0.005 % ophthalmic solution INSTILL ONE DROP IN BOTH EYES AT BEDTIME.  3   losartan  (COZAAR ) 100 MG tablet TAKE 1 TABLET BY MOUTH EVERY DAY 90 tablet 3   metoprolol  succinate (TOPROL -XL) 25 MG 24 hr tablet TAKE 1 TABLET (25 MG TOTAL) BY MOUTH DAILY. 90 tablet 3   potassium chloride  (KLOR-CON ) 10 MEQ tablet TAKE 1 TABLET BY MOUTH EVERY DAY 90 tablet 3   No current facility-administered medications on file prior to visit.    Review of Systems  Constitutional:  Negative for fatigue and fever.  Genitourinary:  Negative for flank pain and frequency.  Musculoskeletal:  Positive for arthralgias, back pain and gait problem. Negative for joint swelling and myalgias.       Back pain with rad to LLL  Also right knee pain baseline  Psychiatric/Behavioral:  Negative for confusion.        Objective:   Physical Exam Constitutional:      General: She is not in acute distress.    Appearance: Normal appearance. She is well-developed. She is obese. She is not ill-appearing or diaphoretic.  HENT:     Head: Normocephalic and atraumatic.  Eyes:     General: No scleral icterus.    Conjunctiva/sclera: Conjunctivae normal.     Pupils: Pupils are equal, round, and  reactive to light.  Cardiovascular:     Rate and Rhythm: Normal rate and regular rhythm.  Pulmonary:     Effort: Pulmonary effort is normal.     Breath sounds: Normal breath sounds. No wheezing or rales.  Abdominal:     General: Bowel sounds are normal. There is no distension.     Palpations: Abdomen is soft.     Tenderness: There is no abdominal tenderness.  Musculoskeletal:        General: Tenderness present.     Cervical back: Normal range of motion and neck supple.     Lumbar back: Spasms and tenderness present. No edema or bony tenderness. Decreased range of motion.     Comments: Mod to severe kyphosis  Loss of lordosis   Some mild left lumbar muscular tenderness No bony tenderness  Flex-full without pain  Ext 10 deg max Walks with walker bent forward   Lymphadenopathy:     Cervical: No cervical adenopathy.  Skin:    General: Skin is warm and dry.     Coloration: Skin is not pale.     Findings: No erythema or rash.  Neurological:     Mental Status: She is alert.     Cranial Nerves: No cranial nerve deficit.     Sensory: No sensory deficit.     Motor: No weakness, atrophy or abnormal muscle tone.     Coordination: Coordination normal.     Deep Tendon Reflexes: Reflexes are normal and symmetric. Reflexes normal.     Comments: Negative bent knee raise  Rom of spine and hips is fairly good   Psychiatric:        Mood and Affect: Mood normal.           Assessment & Plan:   Problem List Items Addressed This Visit       Musculoskeletal and Integument   Osteoporosis   Continues alendronate   Recent LS film noted old L2 endplate compression there since 2020 , may have progressed  Her pain tender to be lower  Lumbar disc disease     Other   Low back pain - Primary   Acute on chronic  Recent more severe flare/was unable to walk for a period of time and went to ER Did have sciatica symptoms-now improved  Reviewed hospital records, lab results and studies  in detail  Noted Also reviewed note from NP Vincente on 9/8  Old L2 compression deformity, disk space narrowing, anterolisthesis L4-5 , L5-S1  and severe disk space narrowing L1 ot L4   Today symptoms are improved with gabapentin  and tylenol  Able to walk with walker  Will continue gabapentin  100 mg bid with caution of sedation and falls Offered spine specialist ref -declined due to improvement Ref for [facility] PT (is unable to drive) to see if this helps  Call back and Er precautions noted in detail today         Relevant Medications   gabapentin  (NEURONTIN ) 100 MG capsule   Other Visit Diagnoses       Encounter for immunization       Relevant Orders   Flu vaccine HIGH DOSE PF(Fluzone Trivalent) (Completed)

## 2024-03-21 NOTE — Patient Instructions (Signed)
 Flu shot today   I do recommend a covid shot at the pharmacy    Continue the gabapentin  for your back pain (I sent refills)   If symptoms worsen in the meantime let us  know    I put the referral in for home health physical therapy (to come to your home)  Please let us  know if you don't hear in 1-2 weeks to set that up (mychart message or call or letter)

## 2024-03-21 NOTE — Assessment & Plan Note (Signed)
 Acute on chronic  Recent more severe flare/was unable to walk for a period of time and went to ER Did have sciatica symptoms-now improved  Reviewed hospital records, lab results and studies in detail  Noted Also reviewed note from NP Vincente on 9/8  Old L2 compression deformity, disk space narrowing, anterolisthesis L4-5 , L5-S1  and severe disk space narrowing L1 ot L4   Today symptoms are improved with gabapentin  and tylenol  Able to walk with walker  Will continue gabapentin  100 mg bid with caution of sedation and falls Offered spine specialist ref -declined due to improvement Ref for [facility] PT (is unable to drive) to see if this helps  Call back and Er precautions noted in detail today

## 2024-04-01 ENCOUNTER — Other Ambulatory Visit: Payer: Self-pay | Admitting: Cardiovascular Disease

## 2024-04-26 DIAGNOSIS — G8929 Other chronic pain: Secondary | ICD-10-CM | POA: Diagnosis not present

## 2024-04-26 DIAGNOSIS — I493 Ventricular premature depolarization: Secondary | ICD-10-CM

## 2024-04-26 DIAGNOSIS — I130 Hypertensive heart and chronic kidney disease with heart failure and stage 1 through stage 4 chronic kidney disease, or unspecified chronic kidney disease: Secondary | ICD-10-CM

## 2024-04-26 DIAGNOSIS — J309 Allergic rhinitis, unspecified: Secondary | ICD-10-CM

## 2024-04-26 DIAGNOSIS — B0229 Other postherpetic nervous system involvement: Secondary | ICD-10-CM

## 2024-04-26 DIAGNOSIS — I4821 Permanent atrial fibrillation: Secondary | ICD-10-CM

## 2024-04-26 DIAGNOSIS — M4726 Other spondylosis with radiculopathy, lumbar region: Secondary | ICD-10-CM | POA: Diagnosis not present

## 2024-04-26 DIAGNOSIS — M4317 Spondylolisthesis, lumbosacral region: Secondary | ICD-10-CM | POA: Diagnosis not present

## 2024-04-26 DIAGNOSIS — M48061 Spinal stenosis, lumbar region without neurogenic claudication: Secondary | ICD-10-CM

## 2024-04-26 DIAGNOSIS — I5032 Chronic diastolic (congestive) heart failure: Secondary | ICD-10-CM

## 2024-04-26 DIAGNOSIS — M5116 Intervertebral disc disorders with radiculopathy, lumbar region: Secondary | ICD-10-CM | POA: Diagnosis not present

## 2024-04-26 DIAGNOSIS — N1831 Chronic kidney disease, stage 3a: Secondary | ICD-10-CM

## 2024-05-06 ENCOUNTER — Ambulatory Visit: Admitting: Podiatry

## 2024-05-06 DIAGNOSIS — B351 Tinea unguium: Secondary | ICD-10-CM | POA: Diagnosis not present

## 2024-05-06 DIAGNOSIS — M79674 Pain in right toe(s): Secondary | ICD-10-CM | POA: Diagnosis not present

## 2024-05-06 DIAGNOSIS — M79675 Pain in left toe(s): Secondary | ICD-10-CM | POA: Diagnosis not present

## 2024-05-06 NOTE — Progress Notes (Signed)
 Cardiology Office Note  Date:  05/09/2024   ID:  MARDEL GRUDZIEN, DOB 12/11/1935, MRN [id number]  PCP:  [name] LABOR, MD   Chief Complaint  Patient presents with   Follow-up    12 month follow up  / pt has no complaints of chest pain, chest pressure .The pt report SOB with walking across the room , medication reviewed verbally with patient.    HPI:  Linda Johnson is a 88 y.o. woman with a hx of  Permanent atrial fibrillation,since 2017 previously declined cardioversion On anticoagulation/eliquis  hypertension who presents for routine follow-up of her  PVCs, atrial fibrillation  LOV 10/24 1 year increasing SOB Prior notes indicating she has chronic mild shortness of breath on exertion Denies significant lower extremity swelling, takes Lasix  daily  Uses a walker, no recent falls No regular exercise program Sedentary baseline  Back pain sept 2025, on gabapentin  BID Doing PT, 1 x a week, 8 weeks  Still driving, Likes to drive to visit daughter, Doctor visits does not like going to the shopping stores, groceries Linda Johnson is heavy  Does not appreciate tachypalpitations Tolerating Eliquis , has a grant  Prior history of supratherapeutic INR of 7 on warfarin Changed to Eliquis   Previously felt she had nausea from the low-dose amiodarone   Atrial fibrillation picked up by primary care on routine EKG early 2017  EKG personally reviewed by myself on todays visit EKG Interpretation Date/Time:  Monday May 09 2024 11:57:41 EST Ventricular Rate:  90 PR Interval:    QRS Duration:  100 QT Interval:  360 QTC Calculation: 440 R Axis:   73  Text Interpretation: Atrial fibrillation Incomplete right bundle branch block When compared with ECG of 05-Mar-2024 14:05, No significant change was found Confirmed by Perla Lye [phone]) on 05/09/2024 12:18:26 PM    PMH:   has a past medical history of Arthritis, Arthritis, Atrial fibrillation (HCC), Back pain, Balance problem,  Cardiomegaly, Cataract (2020), CHF (congestive heart failure) (HCC) (07/2018), Hypertension, Osteopenia, Pleural effusion (08/25/2018), Shingles (08/2017), and SOB (shortness of breath).  PSH:    Past Surgical History:  Procedure Laterality Date   BREAST BIOPSY Left 2011   2 areas, cysts per pt   CATARACT EXTRACTION W/PHACO Right 12/06/2018   Procedure: CATARACT EXTRACTION PHACO AND INTRAOCULAR LENS PLACEMENT (IOC) RIGHT;  Surgeon: Myrna Adine Anes, MD;  Location: [facility];  Service: Ophthalmology;  Laterality: Right;   CATARACT EXTRACTION W/PHACO Left 01/17/2019   Procedure: CATARACT EXTRACTION PHACO AND INTRAOCULAR LENS PLACEMENT (IOC) LEFT;  Surgeon: Myrna Adine Anes, MD;  Location: [facility];  Service: Ophthalmology;  Laterality: Left;    Current Outpatient Medications  Medication Sig Dispense Refill   alendronate  (FOSAMAX ) 70 MG tablet Take 1 tablet (70 mg total) by mouth every 7 (seven) days. Take with a full glass of water on an empty stomach. 12 tablet 3   apixaban  (ELIQUIS ) 5 MG TABS tablet Take 1 tablet (5 mg total) by mouth 2 (two) times daily. 180 tablet 3   diltiazem  (CARDIZEM  CD) 120 MG 24 hr capsule TAKE 1 CAPSULE BY MOUTH EVERY DAY 90 capsule 0   doxazosin  (CARDURA ) 8 MG tablet TAKE 1/2 TABLET (4 MG TOTAL) BY MOUTH 2 (TWO) TIMES DAILY. 90 tablet 3   furosemide  (LASIX ) 20 MG tablet TAKE 1 TABLET BY MOUTH EVERY DAY 90 tablet 0   gabapentin  (NEURONTIN ) 100 MG capsule Take 1 capsule (100 mg total) by mouth 2 (two) times daily. 180 capsule 1   latanoprost  (XALATAN ) 0.005 %  ophthalmic solution INSTILL ONE DROP IN BOTH EYES AT BEDTIME.  3   losartan  (COZAAR ) 100 MG tablet TAKE 1 TABLET BY MOUTH EVERY DAY 90 tablet 3   metoprolol  succinate (TOPROL -XL) 25 MG 24 hr tablet TAKE 1 TABLET (25 MG TOTAL) BY MOUTH DAILY. 90 tablet 3   potassium chloride  (KLOR-CON ) 10 MEQ tablet TAKE 1 TABLET BY MOUTH EVERY DAY 90 tablet 3   No current facility-administered medications  for this visit.    Allergies:   Ace inhibitors, Hydrocodone  bit-homatrop mbr, and Tramadol hcl   Social History:  The patient  reports that she has never smoked. She has never used smokeless tobacco. She reports that she does not drink alcohol and does not use drugs.   Family History:   family history includes Breast cancer (age of onset: 68) in her maternal aunt; Breast cancer (age of onset: 37) in her sister; Leukemia in her sister.    Review of Systems: Review of Systems  HENT: Negative.    Eyes: Negative.   Respiratory:  Positive for shortness of breath.   Cardiovascular: Negative.   Gastrointestinal: Negative.   Genitourinary: Negative.   Musculoskeletal: Negative.   Neurological: Negative.   Psychiatric/Behavioral: Negative.    All other systems reviewed and are negative.   PHYSICAL EXAM: VS:  BP (!) 148/82 (BP Location: Left Arm, Patient Position: Sitting, Cuff Size: Normal)   Pulse 90   Ht 5' 3 (1.6 m)   Wt 174 lb 9.6 oz (79.2 kg)   LMP  (LMP Unknown)   SpO2 95%   BMI 30.93 kg/m  , BMI Body mass index is 30.93 kg/m.  Constitutional:  oriented to person, place, and time. No distress.  HENT:  Head: Normocephalic and atraumatic.  Eyes:  no discharge. No scleral icterus.  Neck: Normal range of motion. Neck supple. No JVD present.  Cardiovascular: Normal rate, regular rhythm, normal heart sounds and intact distal pulses. Exam reveals no gallop and no friction rub. No edema No murmur heard. Pulmonary/Chest: Effort normal and breath sounds normal. No stridor. No respiratory distress.  no wheezes.  no rales.  no tenderness.  Abdominal: Soft.  no distension.  no tenderness.  Musculoskeletal: Normal range of motion.  no  tenderness or deformity.  Neurological:  normal muscle tone. Coordination normal. No atrophy Skin: Skin is warm and dry. No rash noted. not diaphoretic.  Psychiatric:  normal mood and affect. behavior is normal. Thought content normal.    Recent  Labs: 12/29/2023: ALT 9; TSH 3.72 03/07/2024: BUN 27; Creatinine, Ser 1.22; Hemoglobin 13.5; Platelets 211.0; Potassium 4.6; Sodium 141    Lipid Panel Lab Results  Component Value Date   CHOL 140 12/29/2023   HDL 44.80 12/29/2023   LDLCALC 81 12/29/2023   TRIG 69.0 12/29/2023      Wt Readings from Last 3 Encounters:  05/09/24 174 lb 9.6 oz (79.2 kg)  03/21/24 170 lb 6.4 oz (77.3 kg)  03/07/24 166 lb (75.3 kg)     ASSESSMENT AND PLAN:  Chronic diastolic CHF Chronic shortness of breath,  Feels her symptoms are getting worse She takes Lasix  daily, appears euvolemic if not prerenal based on lab work 2 months ago Suspect significant component of being deconditioned, is sedentary at baseline  Echo ordered to confirm normal ejection fraction as seen previously 2020  Permanent atrial fibrillation (HCC)  Tolerating Eliquis  5 mg twice a day , did not tolerate warfarin Rate control with diltiazem  120 daily Recommend to increase metoprolol  succinate up to  25 twice daily for better rate and blood pressure control   Essential hypertension Blood pressure mildly elevated, recommend she increase metoprolol  succinate up to 25 twice daily  Gait instability Walks with a walker, no recent falls Sedentary, no exercise program Working with PT once a week Otherwise sedentary  PVCs Asymptomatic Rare PVCs by history   Signed, Velinda Lunger, M.D., Ph.D. 05/09/2024  [facility] [place], [place] [phone]

## 2024-05-09 ENCOUNTER — Ambulatory Visit: Attending: Cardiovascular Disease | Admitting: Cardiovascular Disease

## 2024-05-09 ENCOUNTER — Encounter: Payer: Self-pay | Admitting: Cardiovascular Disease

## 2024-05-09 VITALS — BP 148/82 | HR 90 | Ht 63.0 in | Wt 174.6 lb

## 2024-05-09 DIAGNOSIS — I10 Essential (primary) hypertension: Secondary | ICD-10-CM

## 2024-05-09 DIAGNOSIS — I482 Chronic atrial fibrillation, unspecified: Secondary | ICD-10-CM

## 2024-05-09 DIAGNOSIS — I493 Ventricular premature depolarization: Secondary | ICD-10-CM

## 2024-05-09 DIAGNOSIS — R0602 Shortness of breath: Secondary | ICD-10-CM

## 2024-05-09 DIAGNOSIS — I5032 Chronic diastolic (congestive) heart failure: Secondary | ICD-10-CM

## 2024-05-09 DIAGNOSIS — E782 Mixed hyperlipidemia: Secondary | ICD-10-CM

## 2024-05-09 MED ORDER — DILTIAZEM HCL ER COATED BEADS 120 MG PO CP24: 120.0000 mg | ORAL_CAPSULE | Freq: Every day | ORAL | 3 refills | Status: DC

## 2024-05-09 MED ORDER — FUROSEMIDE 20 MG PO TABS: 20.0000 mg | ORAL_TABLET | Freq: Every day | ORAL | 3 refills | Status: DC

## 2024-05-09 MED ORDER — METOPROLOL SUCCINATE ER 25 MG PO TB24: 25.0000 mg | ORAL_TABLET | Freq: Two times a day (BID) | ORAL | 3 refills | Status: DC

## 2024-05-09 MED ORDER — POTASSIUM CHLORIDE ER 10 MEQ PO TBCR: 10.0000 meq | EXTENDED_RELEASE_TABLET | Freq: Every day | ORAL | 3 refills | Status: DC

## 2024-05-09 MED ORDER — DOXAZOSIN MESYLATE 8 MG PO TABS: ORAL_TABLET | ORAL | 3 refills | Status: DC

## 2024-05-09 MED ORDER — LOSARTAN POTASSIUM 100 MG PO TABS: 100.0000 mg | ORAL_TABLET | Freq: Every day | ORAL | 3 refills | Status: DC

## 2024-05-09 NOTE — Patient Instructions (Addendum)
 Medication Instructions:  Please increase the metoprolol  succinate up to 25 mg twice a day  If you need a refill on your cardiac medications before your next appointment, please call your pharmacy.   Lab work: No new labs needed  Testing/Procedures: Echo for afib, shortness of breath  Your physician has requested that you have an echocardiogram. Echocardiography is a painless test that uses sound waves to create images of your heart. It provides your doctor with information about the size and shape of your heart and how well your heart's chambers and valves are working.   You may receive an ultrasound enhancing agent through an IV if needed to better visualize your heart during the echo. This procedure takes approximately one hour.  There are no restrictions for this procedure.  This will take place at [address] [facility]) [address], [place] [address]  Please note: We ask at that you not bring children with you during ultrasound (echo/ vascular) testing. Due to room size and safety concerns, children are not allowed in the ultrasound rooms during exams. Our front office staff cannot provide observation of children in our lobby area while testing is being conducted. An adult accompanying a patient to their appointment will only be allowed in the ultrasound room at the discretion of the ultrasound technician under special circumstances. We apologize for any inconvenience.   Follow-Up: At [facility], you and your health needs are our priority.  As part of our continuing mission to provide you with exceptional heart care, we have created designated Provider Care Teams.  These Care Teams include your primary Cardiologist (physician) and Advanced Practice Providers (APPs -  Physician Assistants and Nurse Practitioners) who all work together to provide you with the care you need, when you need it.  You will need a follow up appointment in 12 months  Providers on your  designated Care Team:   Lonni Meager, NP Bernardino Bring, PA-C Cadence [place], [place]  COVID-19 Vaccine Information can be found at: [email or web address] For questions related to vaccine distribution or appointments, please email [email or web address] or call [phone].

## 2024-05-12 ENCOUNTER — Telehealth: Payer: Self-pay | Admitting: Cardiovascular Disease

## 2024-05-12 NOTE — Telephone Encounter (Signed)
 Pts daughter requesting a c/b

## 2024-05-12 NOTE — Telephone Encounter (Signed)
 Pt c/o Shortness Of Breath: STAT if SOB developed within the last 24 hours or pt is noticeably SOB on the phone  1. Are you currently SOB (can you hear that pt is SOB on the phone)? Yes  2. How long have you been experiencing SOB? Since Monday   3. Are you SOB when sitting or when up moving around? Moving around  4. Are you currently experiencing any other symptoms? Has to go urinate more.    Pt c/o medication issue:  1. Name of Medication:   metoprolol  succinate (TOPROL -XL) 25 MG 24 hr tablet    2. How are you currently taking this medication (dosage and times per day)?  Take 1 tablet (25 mg total) by mouth 2 (two) times daily.      3. Are you having a reaction (difficulty breathing--STAT)? No  4. What is your medication issue? Pt's daughter olam stated pt was advised to start doubling this medication and since she has it's caused her to be more SOB and go to the bathroom more often. Please advise

## 2024-05-12 NOTE — Telephone Encounter (Signed)
 Called Linda Johnson and told her to increase lasix  to 20 mg twice daily over the weekend - provided ER precautions with increased shortness of breath, Linda Johnson verbalized understanding and agreement with plan - she will call back on Monday with update  Last BP/HR  124/70 Hr 74

## 2024-05-12 NOTE — Telephone Encounter (Signed)
 Called daughter, Olam, for further information. Daughter states that patient's shortness of breath has become more prevalent since increasing toprol  xl to 25 mg BID, patient will get short of breath while getting dressed, daughter denies dizzy or light headed, being short of breath causes patient anxiety and increases urinary frequency. Olam reports BP as 120/70  Patient has echo scheduled for 07/05/24 - would like to have this sooner, even in [place]  Last OV 11/10 [place]

## 2024-05-15 ENCOUNTER — Encounter: Payer: Self-pay | Admitting: Podiatry

## 2024-05-15 NOTE — Progress Notes (Signed)
  Subjective:  Patient ID: Linda Johnson, female    DOB: October 02, 1935,  MRN: [id number]  Linda Johnson presents to clinic today for painful mycotic toenails x 10 which interfere with daily activities. Pain is relieved with periodic professional debridement.  Chief Complaint  Patient presents with   Toe Pain    Dr. Severo is her PCP. Last visit was in Sept. Denies being diabteci   New problem(s): None.   PCP is Tower, Laine LABOR, MD.  Allergies  Allergen Reactions   Ace Inhibitors Cough        Hydrocodone  Bit-Homatrop Mbr Nausea Only   Tramadol Hcl Nausea Only         Review of Systems: Negative except as noted in the HPI.  Objective: No changes noted in today's physical examination. There were no vitals filed for this visit. Linda Johnson is a pleasant 88 y.o. female WD, WN in NAD. AAO x 3.  Vascular Examination: Capillary refill time immediate b/l. Vascular status intact b/l with palpable pedal pulses. Pedal hair present b/l. No pain with calf compression b/l. Skin temperature gradient WNL b/l. No cyanosis or clubbing b/l. No ischemia or gangrene noted b/l. No edema noted b/l LE.  Neurological Examination: Sensation grossly intact b/l with 10 gram monofilament. Vibratory sensation intact b/l.   Dermatological Examination: Pedal skin with normal turgor, texture and tone b/l.  No open wounds. No interdigital macerations.   Evidence of total matrixectomy bilateral great toes.Toenails 2-5 b/l thick, discolored, elongated with subungual debris and pain on dorsal palpation.   No corns, calluses, nor porokeratotic lesions.  Musculoskeletal Examination: Muscle strength 5/5 to all lower extremity muscle groups bilaterally. No pain, crepitus or joint limitation noted with ROM bilateral LE. No gross bony deformities bilaterally.  Radiographs: None  Assessment/Plan: 1. Pain due to onychomycosis of toenails of both feet   Consent given for treatment. Patient examined. All patient's  and/or POA's questions/concerns addressed on today's visit. Mycotic toenails 1-5 b/l debrided in length and girth without incident. Continue soft, supportive shoe gear daily. Report any pedal injuries to medical professional. Call office if there are any quesitons/concerns. -Patient/POA to call should there be question/concern in the interim.   Return in about 3 months (around 08/06/2024).  Linda Johnson, DPM      [place] LOCATION: [address] [place] [address]                   Office [phone]   [facility] LOCATION: [address] [place], [place] [address] Office [phone]

## 2024-05-16 ENCOUNTER — Other Ambulatory Visit: Payer: Self-pay | Admitting: Cardiovascular Disease

## 2024-05-16 ENCOUNTER — Encounter: Payer: Self-pay | Admitting: Cardiovascular Disease

## 2024-05-17 ENCOUNTER — Inpatient Hospital Stay
Admission: EM | Admit: 2024-05-17 | Discharge: 2024-05-30 | DRG: 291 | Disposition: E | Attending: Internal Medicine | Admitting: Internal Medicine

## 2024-05-17 ENCOUNTER — Emergency Department

## 2024-05-17 ENCOUNTER — Other Ambulatory Visit: Payer: Self-pay

## 2024-05-17 DIAGNOSIS — I493 Ventricular premature depolarization: Secondary | ICD-10-CM | POA: Diagnosis present

## 2024-05-17 DIAGNOSIS — Z66 Do not resuscitate: Secondary | ICD-10-CM | POA: Diagnosis present

## 2024-05-17 DIAGNOSIS — I482 Chronic atrial fibrillation, unspecified: Secondary | ICD-10-CM | POA: Diagnosis not present

## 2024-05-17 DIAGNOSIS — M8580 Other specified disorders of bone density and structure, unspecified site: Secondary | ICD-10-CM | POA: Diagnosis present

## 2024-05-17 DIAGNOSIS — Z515 Encounter for palliative care: Secondary | ICD-10-CM | POA: Diagnosis not present

## 2024-05-17 DIAGNOSIS — Z604 Social exclusion and rejection: Secondary | ICD-10-CM | POA: Diagnosis present

## 2024-05-17 DIAGNOSIS — I4821 Permanent atrial fibrillation: Secondary | ICD-10-CM | POA: Diagnosis present

## 2024-05-17 DIAGNOSIS — I4510 Unspecified right bundle-branch block: Secondary | ICD-10-CM | POA: Diagnosis present

## 2024-05-17 DIAGNOSIS — Z79899 Other long term (current) drug therapy: Secondary | ICD-10-CM | POA: Diagnosis not present

## 2024-05-17 DIAGNOSIS — Z7901 Long term (current) use of anticoagulants: Secondary | ICD-10-CM | POA: Diagnosis not present

## 2024-05-17 DIAGNOSIS — Z803 Family history of malignant neoplasm of breast: Secondary | ICD-10-CM

## 2024-05-17 DIAGNOSIS — R0602 Shortness of breath: Secondary | ICD-10-CM | POA: Diagnosis present

## 2024-05-17 DIAGNOSIS — M1990 Unspecified osteoarthritis, unspecified site: Secondary | ICD-10-CM | POA: Diagnosis present

## 2024-05-17 DIAGNOSIS — G629 Polyneuropathy, unspecified: Secondary | ICD-10-CM | POA: Diagnosis present

## 2024-05-17 DIAGNOSIS — I110 Hypertensive heart disease with heart failure: Principal | ICD-10-CM | POA: Diagnosis present

## 2024-05-17 DIAGNOSIS — Z7983 Long term (current) use of bisphosphonates: Secondary | ICD-10-CM

## 2024-05-17 DIAGNOSIS — R569 Unspecified convulsions: Secondary | ICD-10-CM | POA: Diagnosis present

## 2024-05-17 DIAGNOSIS — Z806 Family history of leukemia: Secondary | ICD-10-CM

## 2024-05-17 DIAGNOSIS — R680 Hypothermia, not associated with low environmental temperature: Secondary | ICD-10-CM | POA: Diagnosis present

## 2024-05-17 DIAGNOSIS — I509 Heart failure, unspecified: Secondary | ICD-10-CM | POA: Diagnosis not present

## 2024-05-17 DIAGNOSIS — Z7189 Other specified counseling: Secondary | ICD-10-CM | POA: Diagnosis not present

## 2024-05-17 DIAGNOSIS — I959 Hypotension, unspecified: Secondary | ICD-10-CM | POA: Diagnosis not present

## 2024-05-17 DIAGNOSIS — I5033 Acute on chronic diastolic (congestive) heart failure: Secondary | ICD-10-CM | POA: Diagnosis present

## 2024-05-17 DIAGNOSIS — R2689 Other abnormalities of gait and mobility: Secondary | ICD-10-CM | POA: Diagnosis present

## 2024-05-17 DIAGNOSIS — I10 Essential (primary) hypertension: Secondary | ICD-10-CM | POA: Diagnosis present

## 2024-05-17 DIAGNOSIS — R54 Age-related physical debility: Secondary | ICD-10-CM | POA: Diagnosis present

## 2024-05-17 DIAGNOSIS — I5031 Acute diastolic (congestive) heart failure: Secondary | ICD-10-CM | POA: Diagnosis not present

## 2024-05-17 LAB — CBC
HCT: 41.3 % (ref 36.0–46.0)
Hemoglobin: 13.2 g/dL (ref 12.0–15.0)
MCH: 31.6 pg (ref 26.0–34.0)
MCHC: 32 g/dL (ref 30.0–36.0)
MCV: 98.8 fL (ref 80.0–100.0)
Platelets: 165 K/uL (ref 150–400)
RBC: 4.18 MIL/uL (ref 3.87–5.11)
RDW: 13.2 % (ref 11.5–15.5)
WBC: 10.9 K/uL — ABNORMAL HIGH (ref 4.0–10.5)
nRBC: 0 % (ref 0.0–0.2)

## 2024-05-17 LAB — PRO BRAIN NATRIURETIC PEPTIDE: Pro Brain Natriuretic Peptide: 1492 pg/mL — ABNORMAL HIGH (ref <300.0)

## 2024-05-17 LAB — BASIC METABOLIC PANEL WITH GFR
Anion gap: 10 (ref 5–15)
BUN: 22 mg/dL (ref 8–23)
CO2: 34 mmol/L — ABNORMAL HIGH (ref 22–32)
Calcium: 9.8 mg/dL (ref 8.9–10.3)
Chloride: 102 mmol/L (ref 98–111)
Creatinine, Ser: 0.96 mg/dL (ref 0.44–1.00)
GFR, Estimated: 57 mL/min — ABNORMAL LOW (ref >60)
Glucose, Bld: 131 mg/dL — ABNORMAL HIGH (ref 70–99)
Potassium: 4.4 mmol/L (ref 3.5–5.1)
Sodium: 145 mmol/L (ref 135–145)

## 2024-05-17 LAB — TROPONIN T, HIGH SENSITIVITY: Troponin T High Sensitivity: 18 ng/L (ref 0–19)

## 2024-05-17 MED ORDER — POTASSIUM CHLORIDE ER 10 MEQ PO TBCR: 10.0000 meq | EXTENDED_RELEASE_TABLET | Freq: Every day | ORAL | Status: DC

## 2024-05-17 MED ORDER — ACETAMINOPHEN 325 MG PO TABS: 650.0000 mg | ORAL_TABLET | Freq: Four times a day (QID) | ORAL | Status: DC | PRN

## 2024-05-17 MED ORDER — DILTIAZEM HCL ER COATED BEADS 120 MG PO CP24
120.0000 mg | ORAL_CAPSULE | Freq: Every day | ORAL | Status: DC
  Filled 2024-05-17: qty 1

## 2024-05-17 MED ORDER — LOSARTAN POTASSIUM 50 MG PO TABS: 100.0000 mg | ORAL_TABLET | Freq: Every day | ORAL | Status: DC

## 2024-05-17 MED ORDER — ONDANSETRON HCL 4 MG/2ML IJ SOLN
4.0000 mg | Freq: Four times a day (QID) | INTRAMUSCULAR | Status: DC | PRN
  Administered 2024-05-18: 4 mg via INTRAVENOUS
  Filled 2024-05-17: qty 2

## 2024-05-17 MED ORDER — GABAPENTIN 100 MG PO CAPS: 100.0000 mg | ORAL_CAPSULE | Freq: Two times a day (BID) | ORAL | Status: DC

## 2024-05-17 MED ORDER — POTASSIUM CHLORIDE CRYS ER 20 MEQ PO TBCR: 10.0000 meq | EXTENDED_RELEASE_TABLET | Freq: Every day | ORAL | Status: DC

## 2024-05-17 MED ORDER — MAGNESIUM HYDROXIDE 400 MG/5ML PO SUSP
30.0000 mL | Freq: Every day | ORAL | Status: DC | PRN
  Administered 2024-05-17: 30 mL via ORAL

## 2024-05-17 MED ORDER — FUROSEMIDE 40 MG PO TABS: 20.0000 mg | ORAL_TABLET | Freq: Every day | ORAL | Status: DC

## 2024-05-17 MED ORDER — APIXABAN 5 MG PO TABS
5.0000 mg | ORAL_TABLET | Freq: Two times a day (BID) | ORAL | Status: DC
  Administered 2024-05-17: 5 mg via ORAL
  Filled 2024-05-17: qty 1

## 2024-05-17 MED ORDER — LATANOPROST 0.005 % OP SOLN
1.0000 [drp] | Freq: Every day | OPHTHALMIC | Status: DC
  Filled 2024-05-17: qty 2.5

## 2024-05-17 MED ORDER — FUROSEMIDE 10 MG/ML IJ SOLN
60.0000 mg | Freq: Once | INTRAMUSCULAR | Status: AC
  Administered 2024-05-17: 60 mg via INTRAVENOUS
  Filled 2024-05-17: qty 8

## 2024-05-17 MED ORDER — MAGNESIUM HYDROXIDE 400 MG/5ML PO SUSP
ORAL | Status: AC
  Filled 2024-05-17: qty 30

## 2024-05-17 MED ORDER — DOXAZOSIN MESYLATE 4 MG PO TABS
8.0000 mg | ORAL_TABLET | Freq: Two times a day (BID) | ORAL | Status: DC
  Administered 2024-05-17: 8 mg via ORAL
  Filled 2024-05-17 (×2): qty 2

## 2024-05-17 MED ORDER — OXYCODONE-ACETAMINOPHEN 5-325 MG PO TABS
1.0000 | ORAL_TABLET | Freq: Once | ORAL | Status: AC
  Administered 2024-05-17: 1 via ORAL
  Filled 2024-05-17: qty 1

## 2024-05-17 MED ORDER — TRAZODONE HCL 50 MG PO TABS: 25.0000 mg | ORAL_TABLET | Freq: Every evening | ORAL | Status: DC | PRN

## 2024-05-17 MED ORDER — METOPROLOL SUCCINATE ER 50 MG PO TB24
ORAL_TABLET | ORAL | Status: AC
  Filled 2024-05-17: qty 1

## 2024-05-17 MED ORDER — FUROSEMIDE 10 MG/ML IJ SOLN: 40.0000 mg | Freq: Two times a day (BID) | INTRAMUSCULAR | Status: DC

## 2024-05-17 MED ORDER — GABAPENTIN 100 MG PO CAPS
ORAL_CAPSULE | ORAL | Status: AC
  Filled 2024-05-17: qty 1

## 2024-05-17 MED ORDER — ACETAMINOPHEN 650 MG RE SUPP: 650.0000 mg | Freq: Four times a day (QID) | RECTAL | Status: DC | PRN

## 2024-05-17 MED ORDER — METOPROLOL SUCCINATE ER 50 MG PO TB24
25.0000 mg | ORAL_TABLET | Freq: Two times a day (BID) | ORAL | Status: DC
  Administered 2024-05-17: 25 mg via ORAL

## 2024-05-17 MED ORDER — ONDANSETRON HCL 4 MG PO TABS: 4.0000 mg | ORAL_TABLET | Freq: Four times a day (QID) | ORAL | Status: DC | PRN

## 2024-05-17 NOTE — Assessment & Plan Note (Addendum)
-   Working new Eliquis  and Cardizem  CD as well as Toprol -XL.

## 2024-05-17 NOTE — Assessment & Plan Note (Signed)
-   Will continue antihypertensive therapy.

## 2024-05-17 NOTE — ED Provider Notes (Signed)
 [facility] Provider Note    Event Date/Time   First MD Initiated Contact with Patient 05/17/24 1935     (approximate)   History   Chest Pain   HPI  Linda Johnson is a 88 y.o. female with a history of underlying CHF who presents today with concern of shortness of breath.  Recently seen by her cardiologist, had her metoprolol  dose increased at that time.  A few days afterward started developing some increased shortness of breath with minimal exertion.  Apparently was told by her cardiologist to increase taking the home Lasix  dose however she did not take the increased dose of Lasix  as apparently her daughter was confused.  Patient has had increased swelling in her extremities.  Does complain of some chest discomfort as well.  Today had 2 syncopal episodes 1 while at home and she fell onto her walker hitting her chest but did not hit her head at the time or completely syncopized.  She had a second syncopal episode while here in our emergency department with our nursing staff apparently she briefly syncopized but again did not fall or hit her head.      Physical Exam   Triage Vital Signs: ED Triage Vitals  Encounter Vitals Group     BP 05/17/24 1830 137/78     Girls Systolic BP Percentile --      Girls Diastolic BP Percentile --      Boys Systolic BP Percentile --      Boys Diastolic BP Percentile --      Pulse Rate 05/17/24 1830 81     Resp 05/17/24 1830 (!) 22     Temp --      Temp src --      SpO2 05/17/24 1830 93 %     Weight 05/17/24 1829 170 lb (77.1 kg)     Height 05/17/24 1829 5' 3 (1.6 m)     Head Circumference --      Peak Flow --      Pain Score 05/17/24 1828 10     Pain Loc --      Pain Education --      Exclude from Growth Chart --     Most recent vital signs: Vitals:   05/17/24 2148 05/17/24 2200  BP: (!) 145/89 (!) 152/73  Pulse: (!) 58 74  Resp:  20  SpO2: 96% 96%     General: Awake, no distress.  CV:  Good peripheral  perfusion.  1+ pitting edema bilaterally in the lower extremities Resp:  Normal effort.  Faint rales in the lower lobes Abd:  No distention.  Soft nontender  Other:     ED Results / Procedures / Treatments   Labs (all labs ordered are listed, but only abnormal results are displayed) Labs Reviewed  BASIC METABOLIC PANEL WITH GFR - Abnormal; Notable for the following components:      Result Value   CO2 34 (*)    Glucose, Bld 131 (*)    GFR, Estimated 57 (*)    All other components within normal limits  CBC - Abnormal; Notable for the following components:   WBC 10.9 (*)    All other components within normal limits  PRO BRAIN NATRIURETIC PEPTIDE - Abnormal; Notable for the following components:   Pro Brain Natriuretic Peptide 1,492.0 (*)    All other components within normal limits  BASIC METABOLIC PANEL WITH GFR  CBC  TROPONIN T, HIGH SENSITIVITY  TROPONIN T,  HIGH SENSITIVITY     EKG  Irregularly irregular rhythm consistent with atrial fibrillation, rate of about 70, axis of about 75, there are occasional PVCs, unfortunately significant artifact making it difficult to appropriately read, I do not appreciate any obvious ischemia and intervals appear to be within normal limits   RADIOLOGY Bilateral pleural effusions are present, similar to the chest x-ray from 2020  PROCEDURES:  Critical Care performed: No  Procedures   MEDICATIONS ORDERED IN ED: Medications  oxyCODONE -acetaminophen  (PERCOCET/ROXICET) 5-325 MG per tablet 1 tablet (has no administration in time range)  diltiazem  (CARDIZEM  CD) 24 hr capsule 120 mg (has no administration in time range)  doxazosin  (CARDURA ) tablet 8 mg (has no administration in time range)  losartan  (COZAAR ) tablet 100 mg (has no administration in time range)  metoprolol  succinate (TOPROL -XL) 24 hr tablet 25 mg (25 mg Oral Total Dose 05/17/24 2346)  apixaban  (ELIQUIS ) tablet 5 mg (has no administration in time range)  gabapentin   (NEURONTIN ) capsule 100 mg (100 mg Oral Not Given 05/17/24 2343)  latanoprost  (XALATAN ) 0.005 % ophthalmic solution 1 drop (has no administration in time range)  acetaminophen  (TYLENOL ) tablet 650 mg (has no administration in time range)    Or  acetaminophen  (TYLENOL ) suppository 650 mg (has no administration in time range)  traZODone (DESYREL) tablet 25 mg (has no administration in time range)  magnesium hydroxide (MILK OF MAGNESIA) suspension 30 mL (30 mLs Oral Total Dose 05/17/24 2344)  ondansetron  (ZOFRAN ) tablet 4 mg (has no administration in time range)    Or  ondansetron  (ZOFRAN ) injection 4 mg (has no administration in time range)  furosemide  (LASIX ) injection 40 mg (has no administration in time range)  potassium chloride  SA (KLOR-CON  M) CR tablet 10 mEq (has no administration in time range)  furosemide  (LASIX ) injection 60 mg (60 mg Intravenous Given 05/17/24 2105)     IMPRESSION / MDM / ASSESSMENT AND PLAN / ED COURSE  I reviewed the triage vital signs and the nursing notes.                               Patient's presentation is most consistent with acute complicated illness / injury requiring diagnostic workup.  88 year old female who presents today with concern of shortness of breath.  Clinically seems consistent with a possible fluid overload state.  Apparently patient was hypoxic upon arrival and was placed on 3 L of oxygen.  Possible that this may have been with exertion, as I was able to wean her down to about 1 L of oxygen with appropriate O2 saturations.  Her EKG is without evidence of ischemia, but she does have bilateral lower extremity swelling and evidence of pleural effusions on chest x-ray.  Will await lab workup with anticipate likely diuresis and admission to medicine team.   Clinical Course as of 05/17/24 2348  Tue May 17, 2024  2037 Patient BNP is elevated, and radiology read does confirm my concern of pulmonary effusions.  Given a dose of Lasix  here, will  reach out to medicine for admission at this time. [SK]    Clinical Course User Index [SK] Fernand Rossie HERO, MD     FINAL CLINICAL IMPRESSION(S) / ED DIAGNOSES   Final diagnoses:  Acute on chronic congestive heart failure, unspecified heart failure type (HCC)     Rx / DC Orders   ED Discharge Orders     None        Note:  This document was prepared using Dragon voice recognition software and may include unintentional dictation errors.   Fernand Rossie HERO, MD 05/17/24 [id number]

## 2024-05-17 NOTE — Assessment & Plan Note (Addendum)
-  The patient will be admitted to a cardiac telemetry bed. - We will continue diuresis with IV Lasix . - We Will follow serial troponins. - We will follow I's and O's and daily weights. - Cardiology consult be obtained. - I notified [facility] group about the patient. - Her last 2D echo in 2020 revealed EF of 55 to 60% with impaired relaxation. - Will obtain a repeat 2D echo.

## 2024-05-17 NOTE — H&P (Signed)
 [place]   PATIENT NAME: Linda Johnson    MR#:  [id number]  DATE OF BIRTH:  May 06, 1936  DATE OF ADMISSION:  05/17/2024  PRIMARY CARE PHYSICIAN: Tower, Laine LABOR, MD   Patient is coming from: Home  REQUESTING/REFERRING PHYSICIAN: Fernand Craw, MD  CHIEF COMPLAINT:   Chief Complaint  Patient presents with   Chest Pain    HISTORY OF PRESENT ILLNESS:  Linda Johnson is a 88 y.o. Caucasian female with medical history significant for diastolic CHF, essential hypertension, osteoarthritis and chronic atrial fibrillation on Eliquis , presented to the ER with acute onset of worsening dyspnea.  She has been seen by his cardiologist and his metoprolol  was increased recently.  She was also advised to take an increased dose of Lasix  as she has been having worsening lower extremity edema.  She admits to dyspnea on exertion as well.  She was having chest discomfort today and had 2 syncopal episodes, one while at home during which she fell onto her walker hitting her chest without head injury or complete syncope.  In our ED she had another episode of syncope without fall or head injury.  No fever or chills.  No nausea or vomiting or abdominal pain.  No dysuria, oliguria or hematuria or flank pain.  No bleeding diathesis.  No dysuria, oliguria or hematuria or flank pain.   ED Course: When she came to the ER, respiratory rate was 22 and pulse oximetry 93% on room air with otherwise normal vital signs.  Labs revealed CO2 of 34 and blood glucose 131 with otherwise unremarkable BMP.  ProBNP was 1492 and high-sensitivity troponin was 18.  CBC showed WBCs of 10.9 EKG as reviewed by me : Atrial fibrillation with premature ventricular complexes with a rate of 72, incomplete right bundle branch block and nonspecific ST-T-segment changes. Imaging: 2 view chest x-ray showed mild pulmonary edema with interstitial prominence and trace left and small right pleural effusion.  The patient was given 60 mg of IV  Lasix .  He will be admitted to a bed for further evaluation and management. PAST MEDICAL HISTORY:   Past Medical History:  Diagnosis Date   Arthritis    hips, knees   Arthritis    Atrial fibrillation (HCC)    chronic   Back pain    Balance problem    Cardiomegaly    Cataract 2020   Right eye December 06, 2018, Left eye date pending   CHF (congestive heart failure) (HCC) 07/2018   chronic   Hypertension    Osteopenia    Pleural effusion 08/25/2018   [facility]   Shingles 08/2017   H/O   SOB (shortness of breath)     PAST SURGICAL HISTORY:   Past Surgical History:  Procedure Laterality Date   BREAST BIOPSY Left 2011   2 areas, cysts per pt   CATARACT EXTRACTION W/PHACO Right 12/06/2018   Procedure: CATARACT EXTRACTION PHACO AND INTRAOCULAR LENS PLACEMENT (IOC) RIGHT;  Surgeon: Myrna Adine Anes, MD;  Location: [facility];  Service: Ophthalmology;  Laterality: Right;   CATARACT EXTRACTION W/PHACO Left 01/17/2019   Procedure: CATARACT EXTRACTION PHACO AND INTRAOCULAR LENS PLACEMENT (IOC) LEFT;  Surgeon: Myrna Adine Anes, MD;  Location: [facility];  Service: Ophthalmology;  Laterality: Left;    SOCIAL HISTORY:   Social History   Tobacco Use   Smoking status: Never   Smokeless tobacco: Never  Substance Use Topics   Alcohol use: No    Alcohol/week: 0.0 standard  drinks of alcohol    FAMILY HISTORY:   Family History  Problem Relation Age of Onset   Leukemia Sister    Breast cancer Sister 22   Breast cancer Maternal Aunt 50    DRUG ALLERGIES:   Allergies  Allergen Reactions   Ace Inhibitors Cough        Hydrocodone  Bit-Homatrop Mbr Nausea Only   Tramadol Hcl Nausea Only         REVIEW OF SYSTEMS:   ROS As per history of present illness. All pertinent systems were reviewed above. Constitutional, HEENT, cardiovascular, respiratory, GI, GU, musculoskeletal, neuro, psychiatric, endocrine, integumentary and hematologic systems were reviewed and are  otherwise negative/unremarkable except for positive findings mentioned above in the HPI.   MEDICATIONS AT HOME:   Prior to Admission medications   Medication Sig Start Date End Date Taking? Authorizing Provider  alendronate  (FOSAMAX ) 70 MG tablet Take 1 tablet (70 mg total) by mouth every 7 (seven) days. Take with a full glass of water on an empty stomach. 12/29/23   Tower, Laine LABOR, MD  apixaban  (ELIQUIS ) 5 MG TABS tablet Take 1 tablet (5 mg total) by mouth 2 (two) times daily. 03/04/24   Gollan, Timothy J, MD  diltiazem  (CARDIZEM  CD) 120 MG 24 hr capsule Take 1 capsule (120 mg total) by mouth daily. 05/09/24   Gollan, Timothy J, MD  doxazosin  (CARDURA ) 8 MG tablet TAKE 1/2 TABLET (4 MG TOTAL) BY MOUTH 2 (TWO) TIMES DAILY. 05/09/24   Gollan, Timothy J, MD  furosemide  (LASIX ) 20 MG tablet Take 1 tablet (20 mg total) by mouth daily. 05/09/24   Gollan, Timothy J, MD  gabapentin  (NEURONTIN ) 100 MG capsule Take 1 capsule (100 mg total) by mouth 2 (two) times daily. 03/21/24   Tower, Laine LABOR, MD  latanoprost  (XALATAN ) 0.005 % ophthalmic solution INSTILL ONE DROP IN BOTH EYES AT BEDTIME. 12/08/16   [provider]  losartan  (COZAAR ) 100 MG tablet Take 1 tablet (100 mg total) by mouth daily. 05/09/24   Gollan, Timothy J, MD  metoprolol  succinate (TOPROL -XL) 25 MG 24 hr tablet Take 1 tablet (25 mg total) by mouth 2 (two) times daily. 05/09/24   Gollan, Timothy J, MD  potassium chloride  (KLOR-CON ) 10 MEQ tablet Take 1 tablet (10 mEq total) by mouth daily. 05/09/24   Gollan, Timothy J, MD      VITAL SIGNS:  Blood pressure (!) 152/73, pulse 74, resp. rate 20, height 5' 3 (1.6 m), weight 77.1 kg, SpO2 96%.  PHYSICAL EXAMINATION:  Physical Exam  GENERAL:  88 y.o.-year-old patient lying in the bed with no acute distress.  EYES: Pupils equal, round, reactive to light and accommodation. No scleral icterus. Extraocular muscles intact.  HEENT: Head atraumatic, normocephalic. Oropharynx and nasopharynx  clear.  NECK:  Supple, no jugular venous distention. No thyroid  enlargement, no tenderness.  LUNGS: Diminished bibasilar breath sounds with bibasilar rales.. No use of accessory muscles of respiration.  CARDIOVASCULAR: Irregularly irregular rhythm, S1, S2 normal. No murmurs, rubs, or gallops.  ABDOMEN: Soft, nondistended, nontender. Bowel sounds present. No organomegaly or mass.  EXTREMITIES: 1+ bilateral lower extremity pitting edema with no cyanosis, or clubbing.  NEUROLOGIC: Cranial nerves II through XII are intact. Muscle strength 5/5 in all extremities. Sensation intact. Gait not checked.  PSYCHIATRIC: The patient is alert and oriented x 3.  Normal affect and good eye contact. SKIN: No obvious rash, lesion, or ulcer.   LABORATORY PANEL:   CBC Recent Labs  Lab 05/17/24 1942  WBC  10.9*  HGB 13.2  HCT 41.3  PLT 165   ------------------------------------------------------------------------------------------------------------------  Chemistries  Recent Labs  Lab 05/17/24 1942  NA 145  K 4.4  CL 102  CO2 34*  GLUCOSE 131*  BUN 22  CREATININE 0.96  CALCIUM  9.8   ------------------------------------------------------------------------------------------------------------------  Cardiac Enzymes No results for input(s): TROPONINI in the last 168 hours. ------------------------------------------------------------------------------------------------------------------  RADIOLOGY:  DG Chest 2 View Result Date: 05/17/2024 EXAM: 2 VIEW(S) XRAY OF THE CHEST 05/17/2024 07:12:37 PM COMPARISON: None available. CLINICAL HISTORY: CP/SOB FINDINGS: LUNGS AND PLEURA: Mild pulmonary edema and interstitial prominence. Trace left pleural effusion. Small right pleural effusion. No pneumothorax. HEART AND MEDIASTINUM: Cardiomegaly. Aortic atherosclerosis. BONES AND SOFT TISSUES: Old right rib fractures. IMPRESSION: 1. Mild pulmonary edema with interstitial prominence. 2. Trace left and small  right pleural effusions. Electronically signed by: Morgane Naveau MD 05/17/2024 08:05 PM EST RP Workstation: [id number]      IMPRESSION AND PLAN:  Assessment and Plan: * Acute on chronic diastolic CHF (congestive heart failure) (HCC)  -The patient will be admitted to a cardiac telemetry bed. - We will continue diuresis with IV Lasix . - We Will follow serial troponins. - We will follow I's and O's and daily weights. - Cardiology consult be obtained. - I notified [facility] group about the patient. - Her last 2D echo in 2020 revealed EF of 55 to 60% with impaired relaxation. - Will obtain a repeat 2D echo.   Essential hypertension - Will continue antihypertensive therapy.  Chronic atrial fibrillation with RVR (HCC) - Working new Eliquis  and Cardizem  CD as well as Toprol -XL.  Peripheral neuropathy - Will continue Neurontin .   DVT prophylaxis: Eliquis . Advanced Care Planning:  Code Status: full code. Family Communication:  The plan of care was discussed in details with the patient (and family). I answered all questions. The patient agreed to proceed with the above mentioned plan. Further management will depend upon hospital course. Disposition Plan: Back to previous home environment Consults called: Cardiology All the records are reviewed and case discussed with ED provider.  Status is: Inpatient  At the time of the admission, it appears that the appropriate admission status for this patient is inpatient.  This is judged to be reasonable and necessary in order to provide the required intensity of service to ensure the patient's safety given the presenting symptoms, physical exam findings and initial radiographic and laboratory data in the context of comorbid conditions.  The patient requires inpatient status due to high intensity of service, high risk of further deterioration and high frequency of surveillance required.  I certify that at the time of admission, it is my clinical  judgment that the patient will require inpatient hospital care extending more than 2 midnights.                            Dispo: The patient is from: Home              Anticipated d/c is to: Home              Patient currently is not medically stable to d/c.              Difficult to place patient: No  Madison DELENA Peaches M.D on 05/17/2024 at 10:59 PM  [facility]   From 7 PM-7 AM, contact night-coverage [email or web address]  CC: Primary care physician; Tower, Laine DELENA, MD

## 2024-05-17 NOTE — Assessment & Plan Note (Signed)
-   Will continue Neurontin .

## 2024-05-17 NOTE — ED Triage Notes (Signed)
 Pt c/o chest pain since Monday night. Pt seen at cardiology on 11/13 and was told to increase her lasix  over the weekend because of her c/o SOB. Pt did not increase the lasix  due to misunderstanding. Pt reports worsening exertional dyspnea.

## 2024-05-18 ENCOUNTER — Inpatient Hospital Stay (HOSPITAL_COMMUNITY)
Admit: 2024-05-18 | Discharge: 2024-05-18 | Disposition: A | Discharge status: Home / Self Care | Attending: Family Medicine | Admitting: Family Medicine

## 2024-05-18 ENCOUNTER — Inpatient Hospital Stay

## 2024-05-18 ENCOUNTER — Other Ambulatory Visit: Payer: Self-pay | Admitting: Cardiovascular Disease

## 2024-05-18 DIAGNOSIS — Z66 Do not resuscitate: Secondary | ICD-10-CM

## 2024-05-18 DIAGNOSIS — Z515 Encounter for palliative care: Secondary | ICD-10-CM | POA: Diagnosis not present

## 2024-05-18 DIAGNOSIS — Z7189 Other specified counseling: Secondary | ICD-10-CM | POA: Diagnosis not present

## 2024-05-18 DIAGNOSIS — I10 Essential (primary) hypertension: Secondary | ICD-10-CM | POA: Diagnosis not present

## 2024-05-18 DIAGNOSIS — I5033 Acute on chronic diastolic (congestive) heart failure: Secondary | ICD-10-CM | POA: Diagnosis not present

## 2024-05-18 DIAGNOSIS — I5031 Acute diastolic (congestive) heart failure: Secondary | ICD-10-CM | POA: Diagnosis not present

## 2024-05-18 DIAGNOSIS — I509 Heart failure, unspecified: Secondary | ICD-10-CM | POA: Diagnosis not present

## 2024-05-18 DIAGNOSIS — I482 Chronic atrial fibrillation, unspecified: Secondary | ICD-10-CM | POA: Diagnosis not present

## 2024-05-18 DIAGNOSIS — I4821 Permanent atrial fibrillation: Secondary | ICD-10-CM

## 2024-05-18 LAB — ECHOCARDIOGRAM COMPLETE
AR max vel: 1.96 cm2
AV Area VTI: 1.79 cm2
AV Area mean vel: 1.91 cm2
AV Mean grad: 3 mmHg
AV Peak grad: 6 mmHg
Ao pk vel: 1.22 m/s
Area-P 1/2: 3.63 cm2
Calc EF: 56.5 %
Height: 63 in
MV VTI: 1.38 cm2
S' Lateral: 3.3 cm
Single Plane A2C EF: 49.8 %
Single Plane A4C EF: 63.8 %
Weight: 2720 [oz_av]

## 2024-05-18 LAB — CBC
HCT: 36.8 % (ref 36.0–46.0)
Hemoglobin: 11.5 g/dL — ABNORMAL LOW (ref 12.0–15.0)
MCH: 31.4 pg (ref 26.0–34.0)
MCHC: 31.3 g/dL (ref 30.0–36.0)
MCV: 100.5 fL — ABNORMAL HIGH (ref 80.0–100.0)
Platelets: 148 K/uL — ABNORMAL LOW (ref 150–400)
RBC: 3.66 MIL/uL — ABNORMAL LOW (ref 3.87–5.11)
RDW: 13.1 % (ref 11.5–15.5)
WBC: 10.9 K/uL — ABNORMAL HIGH (ref 4.0–10.5)
nRBC: 0 % (ref 0.0–0.2)

## 2024-05-18 LAB — CBG MONITORING, ED: Glucose-Capillary: 134 mg/dL — ABNORMAL HIGH (ref 70–99)

## 2024-05-18 LAB — BASIC METABOLIC PANEL WITH GFR
Anion gap: 7 (ref 5–15)
BUN: 23 mg/dL (ref 8–23)
CO2: 36 mmol/L — ABNORMAL HIGH (ref 22–32)
Calcium: 9.1 mg/dL (ref 8.9–10.3)
Chloride: 102 mmol/L (ref 98–111)
Creatinine, Ser: 0.97 mg/dL (ref 0.44–1.00)
GFR, Estimated: 57 mL/min — ABNORMAL LOW (ref >60)
Glucose, Bld: 115 mg/dL — ABNORMAL HIGH (ref 70–99)
Potassium: 4.4 mmol/L (ref 3.5–5.1)
Sodium: 144 mmol/L (ref 135–145)

## 2024-05-18 LAB — TROPONIN T, HIGH SENSITIVITY: Troponin T High Sensitivity: <15 ng/L (ref 0–19)

## 2024-05-18 MED ORDER — GLYCOPYRROLATE 0.2 MG/ML IJ SOLN: 0.2000 mg | INTRAMUSCULAR | Status: DC | PRN

## 2024-05-18 MED ORDER — ACETAMINOPHEN 650 MG RE SUPP: 650.0000 mg | Freq: Four times a day (QID) | RECTAL | Status: DC | PRN

## 2024-05-18 MED ORDER — POTASSIUM CHLORIDE CRYS ER 10 MEQ PO TBCR
10.0000 meq | EXTENDED_RELEASE_TABLET | Freq: Every day | ORAL | Status: DC
  Filled 2024-05-18: qty 1

## 2024-05-18 MED ORDER — MORPHINE SULFATE (PF) 2 MG/ML IV SOLN: 1.0000 mg | INTRAVENOUS | Status: DC | PRN

## 2024-05-18 MED ORDER — NALOXONE HCL 0.4 MG/ML IJ SOLN
INTRAMUSCULAR | Status: AC
  Administered 2024-05-18: 0.4 mg
  Filled 2024-05-18: qty 1

## 2024-05-18 MED ORDER — ONDANSETRON HCL 4 MG/2ML IJ SOLN
4.0000 mg | Freq: Four times a day (QID) | INTRAMUSCULAR | Status: DC | PRN
Start: 2024-05-18 — End: 2024-05-19

## 2024-05-18 MED ORDER — HALOPERIDOL LACTATE 2 MG/ML PO CONC: 0.5000 mg | ORAL | Status: DC | PRN

## 2024-05-18 MED ORDER — PERFLUTREN LIPID MICROSPHERE
1.0000 mL | INTRAVENOUS | Status: AC | PRN
  Administered 2024-05-18: 2 mL via INTRAVENOUS
  Filled 2024-05-18: qty 10

## 2024-05-18 MED ORDER — HALOPERIDOL LACTATE 5 MG/ML IJ SOLN: 0.5000 mg | INTRAMUSCULAR | Status: DC | PRN

## 2024-05-18 MED ORDER — LORAZEPAM 2 MG/ML IJ SOLN
0.5000 mg | INTRAMUSCULAR | Status: DC
  Administered 2024-05-18: 0.5 mg via INTRAVENOUS
  Filled 2024-05-18: qty 1

## 2024-05-18 MED ORDER — GLYCOPYRROLATE 0.2 MG/ML IJ SOLN
0.2000 mg | INTRAMUSCULAR | Status: DC | PRN
Start: 2024-05-18 — End: 2024-05-19

## 2024-05-18 MED ORDER — HALOPERIDOL 0.5 MG PO TABS: 0.5000 mg | ORAL_TABLET | ORAL | Status: DC | PRN

## 2024-05-18 MED ORDER — ONDANSETRON 4 MG PO TBDP: 4.0000 mg | ORAL_TABLET | Freq: Four times a day (QID) | ORAL | Status: DC | PRN

## 2024-05-18 MED ORDER — POLYVINYL ALCOHOL 1.4 % OP SOLN: 1.0000 [drp] | Freq: Four times a day (QID) | OPHTHALMIC | Status: DC | PRN

## 2024-05-18 MED ORDER — GLYCOPYRROLATE 1 MG PO TABS: 1.0000 mg | ORAL_TABLET | ORAL | Status: DC | PRN

## 2024-05-18 MED ORDER — BIOTENE DRY MOUTH MT LIQD
15.0000 mL | OROMUCOSAL | Status: DC | PRN
Start: 2024-05-18 — End: 2024-05-19

## 2024-05-18 MED ORDER — LORAZEPAM 2 MG/ML IJ SOLN
1.0000 mg | Freq: Once | INTRAMUSCULAR | Status: DC
  Filled 2024-05-18: qty 1

## 2024-05-18 MED ORDER — ACETAMINOPHEN 325 MG PO TABS: 650.0000 mg | ORAL_TABLET | Freq: Four times a day (QID) | ORAL | Status: DC | PRN

## 2024-05-19 DIAGNOSIS — R0602 Shortness of breath: Secondary | ICD-10-CM

## 2024-05-30 NOTE — Progress Notes (Signed)
 Patient admitted to 1C room 117. Daughter at bedside with daughter friend for support. Patient unresponsive, nonrebreather mask on, purewick in place, bed linen changed,  reposition for comfort.

## 2024-05-30 NOTE — Death Summary Note (Signed)
 DEATH SUMMARY   Patient Details  Name: Linda Johnson MRN: [id number] DOB: Dec 25, 1935 ERE:Untzm, Laine LABOR, MD Admission/Discharge Information   Admit Date:  June 05, 2024  Date of Death: Date of Death: 06-06-2024  Time of Death: Time of Death: 06/11/2039  Length of Stay: 2   Principle Cause of death: CHF  Hospital Diagnoses: Principal Problem:   Acute on chronic diastolic CHF (congestive heart failure) (HCC) Active Problems:   Essential hypertension   Chronic atrial fibrillation with RVR (HCC)   Peripheral neuropathy   Goals of care, counseling/discussion   Shortness of breath   Acute on chronic congestive heart failure [facility])   Hospital Course: Assessment and Plan:  88 y.o. Caucasian female with medical history significant for diastolic CHF, essential hypertension, osteoarthritis and chronic atrial fibrillation on Eliquis , presented to the ER with acute onset of worsening dyspnea    06-Jun-2024: Goals of care conversation-comfort care.  Neurology, cardiology, palliative care consult    Acute on chronic diastolic heart failure - BNP 1492 and CXR with mild pulmonary edema with interstitial prominence, trace left and small right pleural effusions   Permanent Afib HTN Seizure -per nursing she has had 2 seizures in the ED. She remains nonresponsive, hypotensive, agonal breathing, and hypothermic CT head negative for any acute pathology.  She's actively dying.   Goals of care-comfort care for now after palliative care meeting with family         Consultations: Cardio, Palliative care, Neuro  The results of significant diagnostics from this hospitalization (including imaging, microbiology, ancillary and laboratory) are listed below for reference.   Significant Diagnostic Studies: ECHOCARDIOGRAM COMPLETE Result Date: 06-06-2024    ECHOCARDIOGRAM REPORT   Patient Name:   Linda Johnson Date of Exam: 06/06/2024 Medical Rec #:  [id number]      Height:       63.0 in Accession #:     [id number]     Weight:       170.0 lb Date of Birth:  February 24, 1936      BSA:          1.805 m Patient Age:    88 years       BP:           106/65 mmHg Patient Gender: F              HR:           89 bpm. Exam Location:  [facility] Procedure: 2D Echo, Cardiac Doppler, Color Doppler and Intracardiac            Opacification Agent (Both Spectral and Color Flow Doppler were            utilized during procedure). Indications:     CHF-Acute Diastolic I50.31  History:         Patient has prior history of Echocardiogram examinations, most                  recent 08/25/2018. CHF, Arrythmias:Atrial Fibrillation;                  Signs/Symptoms:Dyspnea.  Sonographer:     Ashley McNeely-Sloane Referring Phys:  [id number] MADISON A MANSY Diagnosing Phys: Timothy Gollan MD IMPRESSIONS  1. Left ventricular ejection fraction, by estimation, is 55 to 60%. Left ventricular ejection fraction by 2D MOD biplane is 56.5 %. The left ventricle has normal function. The left ventricle has no regional wall motion abnormalities. Left ventricular diastolic parameters are indeterminate.  2. Right ventricular systolic  function is mildly reduced. The right ventricular size is mildly enlarged. Tricuspid regurgitation signal is inadequate for assessing PA pressure.  3. Left atrial size was moderately dilated.  4. The mitral valve is normal in structure. No evidence of mitral valve regurgitation. No evidence of mitral stenosis.  5. The aortic valve is normal in structure. Aortic valve regurgitation is mild. No aortic stenosis is present.  6. The inferior vena cava is normal in size with greater than 50% respiratory variability, suggesting right atrial pressure of 3 mmHg.  7. Rhythm is atrial fibrillation FINDINGS  Left Ventricle: Left ventricular ejection fraction, by estimation, is 55 to 60%. Left ventricular ejection fraction by 2D MOD biplane is 56.5 %. The left ventricle has normal function. The left ventricle has no regional wall motion abnormalities. Definity  contrast agent was given IV to delineate the left ventricular endocardial borders. Strain was performed and the global longitudinal strain is indeterminate. The left ventricular internal cavity size was normal in size. There is no left ventricular hypertrophy. Left ventricular diastolic parameters are indeterminate. Right Ventricle: The right ventricular size is mildly enlarged. No increase in right ventricular wall thickness. Right ventricular systolic function is mildly reduced. Tricuspid regurgitation signal is inadequate for assessing PA pressure. Left Atrium: Left atrial size was moderately dilated. Right Atrium: Right atrial size was normal in size. Pericardium: There is no evidence of pericardial effusion. Mitral Valve: The mitral valve is normal in structure. No evidence of mitral valve regurgitation. No evidence of mitral valve stenosis. MV peak gradient, 11.2 mmHg. The mean mitral valve gradient is 4.0 mmHg. Tricuspid Valve: The tricuspid valve is normal in structure. Tricuspid valve regurgitation is not demonstrated. No evidence of tricuspid stenosis. Aortic Valve: The aortic valve is normal in structure. Aortic valve regurgitation is mild. No aortic stenosis is present. Aortic valve mean gradient measures 3.0 mmHg. Aortic valve peak gradient measures 6.0 mmHg. Aortic valve area, by VTI measures 1.79 cm. Pulmonic Valve: The pulmonic valve was normal in structure. Pulmonic valve regurgitation is not visualized. No evidence of pulmonic stenosis. Aorta: The aortic root is normal in size and structure. Venous: The inferior vena cava is normal in size with greater than 50% respiratory variability, suggesting right atrial pressure of 3 mmHg. IAS/Shunts: No atrial level shunt detected by color flow Doppler. Additional Comments: 3D was performed not requiring image post processing on an independent workstation and was indeterminate.  LEFT VENTRICLE PLAX 2D                        Biplane EF (MOD) LVIDd:          4.00 cm         LV Biplane EF:   Left LVIDs:         3.30 cm                          ventricular LV PW:         1.10 cm                          ejection LV IVS:        1.00 cm                          fraction by LVOT diam:     2.00 cm  2D MOD LV SV:         48                               biplane is LV SV Index:   27                               56.5 %. LVOT Area:     3.14 cm                                Diastology                                LV e' medial:    6.42 cm/s LV Volumes (MOD)               LV E/e' medial:  19.3 LV vol d, MOD    60.8 ml       LV e' lateral:   7.07 cm/s A2C:                           LV E/e' lateral: 17.5 LV vol d, MOD    82.5 ml A4C: LV vol s, MOD    30.5 ml A2C: LV vol s, MOD    29.9 ml A4C: LV SV MOD A2C:   30.3 ml LV SV MOD A4C:   82.5 ml LV SV MOD BP:    41.3 ml RIGHT VENTRICLE            IVC RV Basal diam:  4.80 cm    IVC diam: 2.30 cm RV S prime:     6.42 cm/s TAPSE (M-mode): 1.1 cm LEFT ATRIUM           Index        RIGHT ATRIUM           Index LA diam:      3.90 cm 2.16 cm/m   RA Area:     24.90 cm LA Vol (A4C): 61.5 ml 34.08 ml/m  RA Volume:   79.20 ml  43.89 ml/m  AORTIC VALVE                    PULMONIC VALVE AV Area (Vmax):    1.96 cm     PV Vmax:        0.64 m/s AV Area (Vmean):   1.91 cm     PV Vmean:       44.900 cm/s AV Area (VTI):     1.79 cm     PV VTI:         0.101 m AV Vmax:           122.00 cm/s  PV Peak grad:   1.6 mmHg AV Vmean:          88.300 cm/s  PV Mean grad:   1.0 mmHg AV VTI:            0.269 m      RVOT Peak grad: 1 mmHg AV Peak Grad:      6.0 mmHg AV Mean Grad:      3.0 mmHg LVOT Vmax:         76.00 cm/s LVOT Vmean:  53.800 cm/s LVOT VTI:          0.153 m LVOT/AV VTI ratio: 0.57  AORTA Ao Root diam: 3.00 cm MITRAL VALVE MV Area (PHT): 3.63 cm     SHUNTS MV Area VTI:   1.38 cm     Systemic VTI:  0.15 m MV Peak grad:  11.2 mmHg    Systemic Diam: 2.00 cm MV Mean grad:  4.0 mmHg     Pulmonic VTI:  0.091 m MV  Vmax:       1.67 m/s MV Vmean:      82.2 cm/s MV Decel Time: 209 msec MV E velocity: 124.00 cm/s Evalene Lunger MD Electronically signed by Evalene Lunger MD Signature Date/Time: 05/05/2024/4:02:26 PM    Final    CT HEAD WO CONTRAST ( ) Result Date: 05/25/2024 EXAM: CT HEAD WITHOUT CONTRAST 05/28/2024 01:41:36 PM TECHNIQUE: CT of the head was performed without the administration of intravenous contrast. Automated exposure control, iterative reconstruction, and/or weight based adjustment of the mA/kV was utilized to reduce the radiation dose to as low as reasonably achievable. COMPARISON: 12/24/2011 CLINICAL HISTORY: Mental status change, unknown cause. FINDINGS: BRAIN AND VENTRICLES: No acute hemorrhage. No evidence of acute infarct. Encephalomalacia along the left frontal operculum, consistent with old left MCA territory infarct. Patchy hypoattenuation of the cerebral and pontine white matter, consistent with mild chronic small vessel disease. No hydrocephalus. No extra-axial collection. No mass effect or midline shift. ORBITS: No acute abnormality. SINUSES: No acute abnormality. SOFT TISSUES AND SKULL: No acute soft tissue abnormality. No skull fracture. IMPRESSION: 1. No acute intracranial abnormality. 2. Encephalomalacia along the left frontal operculum, consistent with an old left MCA territory infarct. Electronically signed by: Ryan Chess MD 05/12/2024 01:54 PM EST RP Workstation: [id number]   DG Chest 2 View Result Date: 05/17/2024 EXAM: 2 VIEW(S) XRAY OF THE CHEST 05/17/2024 07:12:37 PM COMPARISON: None available. CLINICAL HISTORY: CP/SOB FINDINGS: LUNGS AND PLEURA: Mild pulmonary edema and interstitial prominence. Trace left pleural effusion. Small right pleural effusion. No pneumothorax. HEART AND MEDIASTINUM: Cardiomegaly. Aortic atherosclerosis. BONES AND SOFT TISSUES: Old right rib fractures. IMPRESSION: 1. Mild pulmonary edema with interstitial prominence. 2. Trace left and small right  pleural effusions. Electronically signed by: Morgane Naveau MD 05/17/2024 08:05 PM EST RP Workstation: [id number]    Microbiology: No results found for this or any previous visit (from the past 240 hours).  Time spent: 35 minutes  Signed: Cresencio Fairly, MD 2024-06-15

## 2024-05-30 NOTE — Progress Notes (Signed)
 1      PROGRESS NOTE    Linda Johnson  FMW:[id number] DOB: Aug 07, 1935 DOA: 05/17/2024 PCP: [name] LABOR, MD   Brief Narrative:   88 y.o. Caucasian female with medical history significant for diastolic CHF, essential hypertension, osteoarthritis and chronic atrial fibrillation on Eliquis , presented to the ER with acute onset of worsening dyspnea   May 23, 2024: Goals of care conversation-comfort care.  Neurology, cardiology, palliative care consult   Assessment & Plan:   Principal Problem:   Acute on chronic diastolic CHF (congestive heart failure) (HCC) Active Problems:   Essential hypertension   Chronic atrial fibrillation with RVR (HCC)   Peripheral neuropathy   Goals of care, counseling/discussion   Shortness of breath   Acute on chronic congestive heart failure (HCC)  Acute on chronic diastolic heart failure - BNP 1492 and CXR with mild pulmonary edema with interstitial prominence, trace left and small right pleural effusions - echo from 2020 showed LVEF 60-65%, impaired relaxation - repeat echo shows EF of 55 to 60%   Permanent Afib HTN As outpatient taking Possible seizure -per nursing she has had 2 seizures in the ED. She remains nonresponsive, hypotensive, agonal breathing, and hypothermic Await neurology input.  CT head negative for any acute pathology. Continue ativan  0.5 Q4H and 2mg  PRN for breakthrough seizures.  Further adjustment per neurology evaluation  Goals of care-comfort care for now after palliative care meeting with family   DVT prophylaxis: None - comfort care      Code Status: DNR-comfort care Family Communication: Discussed with Lisa/daughter at bedside Disposition Plan: Depends on her clinical condition   Consultants:  Neurology Palliative care Cardiology    Subjective:  Patient obtunded, hypotensive.  Objective: Vitals:   May 23, 2024 1400 05/23/2024 1430 2024-05-23 1500 23-May-2024 1548  BP: (!) 85/53 (!) 80/44 (!) 73/40 (!) 72/44  Pulse:  95 72 61 69  Resp: 14 (!) 27 (!) 23   Temp:      TempSrc:      SpO2: 93% 97% 95% 99%  Weight:      Height:       No intake or output data in the 24 hours ending 23-May-2024 1701 Filed Weights   05/17/24 1829  Weight: 77.1 kg    Examination:  General exam: Obtunded and ill-appearing Respiratory system: Decreased breath sounds at the bases Cardiovascular system: S1 & S2 heard, irregularly irregular heart rate. Gastrointestinal system: Abdomen is soft, benign Central nervous system: Obtunded. No focal neurological deficits. Skin: No rashes, lesions or ulcers     Data Reviewed: I have personally reviewed following labs and imaging studies  CBC: Recent Labs  Lab 05/17/24 1942 May 23, 2024 0357  WBC 10.9* 10.9*  HGB 13.2 11.5*  HCT 41.3 36.8  MCV 98.8 100.5*  PLT 165 148*   Basic Metabolic Panel: Recent Labs  Lab 05/17/24 1942 May 23, 2024 0357  NA 145 144  K 4.4 4.4  CL 102 102  CO2 34* 36*  GLUCOSE 131* 115*  BUN 22 23  CREATININE 0.96 0.97  CALCIUM  9.8 9.1   GFR: Estimated Creatinine Clearance: 40.9 mL/min (by C-G formula based on SCr of 0.97 mg/dL). Liver Function Tests: No results for input(s): AST, ALT, ALKPHOS, BILITOT, PROT, ALBUMIN in the last 168 hours. No results for input(s): LIPASE, AMYLASE in the last 168 hours. No results for input(s): AMMONIA in the last 168 hours. Coagulation Profile: No results for input(s): INR, PROTIME in the last 168 hours. Cardiac Enzymes: No results for input(s): CKTOTAL, CKMB, CKMBINDEX,  TROPONINI in the last 168 hours. BNP (last 3 results) Recent Labs    05/17/24 1942  PROBNP 1,492.0*   HbA1C: No results for input(s): HGBA1C in the last 72 hours. CBG: Recent Labs  Lab 05/05/2024 1253  GLUCAP 134*     Radiology Studies: ECHOCARDIOGRAM COMPLETE Result Date: 05/06/2024    ECHOCARDIOGRAM REPORT   Patient Name:   Linda Johnson Date of Exam: 05/27/2024 Medical Rec #:  [id number]       Height:       63.0 in Accession #:    [id number]     Weight:       170.0 lb Date of Birth:  [birth year]      BSA:          1.805 m Patient Age:    88 years       BP:           106/65 mmHg Patient Gender: F              HR:           89 bpm. Exam Location:  [facility] Procedure: 2D Echo, Cardiac Doppler, Color Doppler and Intracardiac            Opacification Agent (Both Spectral and Color Flow Doppler were            utilized during procedure). Indications:     CHF-Acute Diastolic I50.31  History:         Patient has prior history of Echocardiogram examinations, most                  recent 08/25/2018. CHF, Arrythmias:Atrial Fibrillation;                  Signs/Symptoms:Dyspnea.  Sonographer:     Ashley McNeely-Sloane Referring Phys:  [id number] MADISON A MANSY Diagnosing Phys: Timothy Gollan MD IMPRESSIONS  1. Left ventricular ejection fraction, by estimation, is 55 to 60%. Left ventricular ejection fraction by 2D MOD biplane is 56.5 %. The left ventricle has normal function. The left ventricle has no regional wall motion abnormalities. Left ventricular diastolic parameters are indeterminate.  2. Right ventricular systolic function is mildly reduced. The right ventricular size is mildly enlarged. Tricuspid regurgitation signal is inadequate for assessing PA pressure.  3. Left atrial size was moderately dilated.  4. The mitral valve is normal in structure. No evidence of mitral valve regurgitation. No evidence of mitral stenosis.  5. The aortic valve is normal in structure. Aortic valve regurgitation is mild. No aortic stenosis is present.  6. The inferior vena cava is normal in size with greater than 50% respiratory variability, suggesting right atrial pressure of 3 mmHg.  7. Rhythm is atrial fibrillation FINDINGS  Left Ventricle: Left ventricular ejection fraction, by estimation, is 55 to 60%. Left ventricular ejection fraction by 2D MOD biplane is 56.5 %. The left ventricle has normal function. The left ventricle has no  regional wall motion abnormalities. Definity contrast agent was given IV to delineate the left ventricular endocardial borders. Strain was performed and the global longitudinal strain is indeterminate. The left ventricular internal cavity size was normal in size. There is no left ventricular hypertrophy. Left ventricular diastolic parameters are indeterminate. Right Ventricle: The right ventricular size is mildly enlarged. No increase in right ventricular wall thickness. Right ventricular systolic function is mildly reduced. Tricuspid regurgitation signal is inadequate for assessing PA pressure. Left Atrium: Left atrial size was moderately dilated. Right Atrium: Right atrial size was normal  in size. Pericardium: There is no evidence of pericardial effusion. Mitral Valve: The mitral valve is normal in structure. No evidence of mitral valve regurgitation. No evidence of mitral valve stenosis. MV peak gradient, 11.2 mmHg. The mean mitral valve gradient is 4.0 mmHg. Tricuspid Valve: The tricuspid valve is normal in structure. Tricuspid valve regurgitation is not demonstrated. No evidence of tricuspid stenosis. Aortic Valve: The aortic valve is normal in structure. Aortic valve regurgitation is mild. No aortic stenosis is present. Aortic valve mean gradient measures 3.0 mmHg. Aortic valve peak gradient measures 6.0 mmHg. Aortic valve area, by VTI measures 1.79 cm. Pulmonic Valve: The pulmonic valve was normal in structure. Pulmonic valve regurgitation is not visualized. No evidence of pulmonic stenosis. Aorta: The aortic root is normal in size and structure. Venous: The inferior vena cava is normal in size with greater than 50% respiratory variability, suggesting right atrial pressure of 3 mmHg. IAS/Shunts: No atrial level shunt detected by color flow Doppler. Additional Comments: 3D was performed not requiring image post processing on an independent workstation and was indeterminate.  LEFT VENTRICLE PLAX 2D                         Biplane EF (MOD) LVIDd:         4.00 cm         LV Biplane EF:   Left LVIDs:         3.30 cm                          ventricular LV PW:         1.10 cm                          ejection LV IVS:        1.00 cm                          fraction by LVOT diam:     2.00 cm                          2D MOD LV SV:         48                               biplane is LV SV Index:   27                               56.5 %. LVOT Area:     3.14 cm                                Diastology                                LV e' medial:    6.42 cm/s LV Volumes (MOD)               LV E/e' medial:  19.3 LV vol d, MOD    60.8 ml       LV e' lateral:   7.07 cm/s A2C:  LV E/e' lateral: 17.5 LV vol d, MOD    82.5 ml A4C: LV vol s, MOD    30.5 ml A2C: LV vol s, MOD    29.9 ml A4C: LV SV MOD A2C:   30.3 ml LV SV MOD A4C:   82.5 ml LV SV MOD BP:    41.3 ml RIGHT VENTRICLE            IVC RV Basal diam:  4.80 cm    IVC diam: 2.30 cm RV S prime:     6.42 cm/s TAPSE (M-mode): 1.1 cm LEFT ATRIUM           Index        RIGHT ATRIUM           Index LA diam:      3.90 cm 2.16 cm/m   RA Area:     24.90 cm LA Vol (A4C): 61.5 ml 34.08 ml/m  RA Volume:   79.20 ml  43.89 ml/m  AORTIC VALVE                    PULMONIC VALVE AV Area (Vmax):    1.96 cm     PV Vmax:        0.64 m/s AV Area (Vmean):   1.91 cm     PV Vmean:       44.900 cm/s AV Area (VTI):     1.79 cm     PV VTI:         0.101 m AV Vmax:           122.00 cm/s  PV Peak grad:   1.6 mmHg AV Vmean:          88.300 cm/s  PV Mean grad:   1.0 mmHg AV VTI:            0.269 m      RVOT Peak grad: 1 mmHg AV Peak Grad:      6.0 mmHg AV Mean Grad:      3.0 mmHg LVOT Vmax:         76.00 cm/s LVOT Vmean:        53.800 cm/s LVOT VTI:          0.153 m LVOT/AV VTI ratio: 0.57  AORTA Ao Root diam: 3.00 cm MITRAL VALVE MV Area (PHT): 3.63 cm     SHUNTS MV Area VTI:   1.38 cm     Systemic VTI:  0.15 m MV Peak grad:  11.2 mmHg    Systemic Diam: 2.00 cm MV Mean grad:   4.0 mmHg     Pulmonic VTI:  0.091 m MV Vmax:       1.67 m/s MV Vmean:      82.2 cm/s MV Decel Time: 209 msec MV E velocity: 124.00 cm/s Evalene Lunger MD Electronically signed by Evalene Lunger MD Signature Date/Time: 05/05/2024/4:02:26 PM    Final    CT HEAD WO CONTRAST ( ) Result Date: 05/10/2024 EXAM: CT HEAD WITHOUT CONTRAST 05/07/2024 01:41:36 PM TECHNIQUE: CT of the head was performed without the administration of intravenous contrast. Automated exposure control, iterative reconstruction, and/or weight based adjustment of the mA/kV was utilized to reduce the radiation dose to as low as reasonably achievable. COMPARISON: 12/24/2011 CLINICAL HISTORY: Mental status change, unknown cause. FINDINGS: BRAIN AND VENTRICLES: No acute hemorrhage. No evidence of acute infarct. Encephalomalacia along the left frontal operculum, consistent with old left MCA territory infarct. Patchy hypoattenuation of the cerebral and pontine white  matter, consistent with mild chronic small vessel disease. No hydrocephalus. No extra-axial collection. No mass effect or midline shift. ORBITS: No acute abnormality. SINUSES: No acute abnormality. SOFT TISSUES AND SKULL: No acute soft tissue abnormality. No skull fracture. IMPRESSION: 1. No acute intracranial abnormality. 2. Encephalomalacia along the left frontal operculum, consistent with an old left MCA territory infarct. Electronically signed by: Ryan Chess MD 05/19/2024 01:54 PM EST RP Workstation: [id number]   DG Chest 2 View Result Date: 05/17/2024 EXAM: 2 VIEW(S) XRAY OF THE CHEST 05/17/2024 07:12:37 PM COMPARISON: None available. CLINICAL HISTORY: CP/SOB FINDINGS: LUNGS AND PLEURA: Mild pulmonary edema and interstitial prominence. Trace left pleural effusion. Small right pleural effusion. No pneumothorax. HEART AND MEDIASTINUM: Cardiomegaly. Aortic atherosclerosis. BONES AND SOFT TISSUES: Old right rib fractures. IMPRESSION: 1. Mild pulmonary edema with interstitial  prominence. 2. Trace left and small right pleural effusions. Electronically signed by: Morgane Naveau MD 05/17/2024 08:05 PM EST RP Workstation: [id number]        Scheduled Meds:  latanoprost   1 drop Both Eyes QHS   LORazepam   0.5 mg Intravenous Q4H   Continuous Infusions:   LOS: 1 day    Time spent: 35 minutes    Pennelope Basque Maree, MD [facility] Pager [phone]  If 7PM-7AM, please contact night-coverage [email or web address]  May 19, 2024, 5:01 PM

## 2024-05-30 NOTE — Progress Notes (Signed)
 OT Cancellation Note  Patient Details Name: DENAE ZULUETA MRN: [id number] DOB: 1936/02/18   Cancelled Treatment:    Reason Eval/Treat Not Completed: Medical issues which prohibited therapy. Per chart review, pt currently unresponsive and off unit for imaging. OT to hold evaluation at this time.   Izetta Claude, MS, OTR/L , CBIS ascom [phone]  05/09/2024, 1:27 PM

## 2024-05-30 NOTE — Consult Note (Addendum)
 Consultation Note Date: 05/01/2024 at 1200  Patient Name: Linda Johnson  DOB: [birth year]  MRN: [id number]  Age / Sex: 88 y.o., female  PCP: Tower, Laine LABOR, MD Referring Physician: Maree Hue, MD  HPI/Patient Profile: 88 y.o. female  with past medical history of diastolic CHF, essential hypertension, osteoarthritis and chronic atrial fibrillation on Eliquis   admitted on 05/17/2024 with worsening dyspnea with syncopal episodes both at home and in ED.  Patient was seen by cardiologist last week and advised to increase metoprolol  as well as Lasix . Daughter endorses patient had dyspnea on exertion, orthopnea, and paroxysmal nocturnal dyspnea PTA.  While home patient was having chest discomfort and sustained a fall onto her walker.  This prompted transfer to the emergency department.  Upon arrival to ED, EKG revealed A-fib with premature ventricular, complexes with a rate of 72, incomplete right bundle branch block and nonspecific ST-T segment changes.  Patient was given IV Lasix  and admitted for further workup.  PMT was consulted to support patient and family with goals of care discussions.  Clinical Assessment and Goals of Care: Extensive chart review completed prior to meeting patient including labs, vital signs, imaging, progress notes, orders, and available advanced directive documents. I then met with patient and her [name] at bedside in the ED to discuss diagnosis prognosis, GOC, EOL wishes, disposition and options.  Patient is nonresponsive and unable to participate in goals of care discussions at this time.  Patient's [name] endorses she is next of kin decision maker in close contact with patient's other 2 children.  I introduced Palliative Medicine as specialized medical care for people living with serious illness. It focuses on providing relief from the symptoms and stress of a serious  illness. The goal is to improve quality of life for both the patient and the family.  We discussed a brief life review of the patient.  Patient has been living independently up until this hospitalization.  She worked the entirety of her adult career with [employer].  Patient is divorced and has 3 children - a daughter in [facility] [place]  and a daughter in [place] , and [name] (present at bedside) - her primary caregiver and lives locally.  Linda Johnson speaks about how she provided care to her mother to maintain as much of her mother's independence as possible.  However, recently patient has not been as active since she is embarassed that she is having urinary incontinence.  Linda Johnson shares patient's grandchildren and great-grandchildren are a huge source of joy for the patient despite her not being able to be as active that she once was.  Linda Johnson recalls a hospitalization in 2020 when she thought her mother would pass away.  She believes that these last 5 years have been a bonus and a wonderful blessing to the patient and her family to be able to have quality time together.  We discussed patient's current illness and what it means in the larger context of patient's on-going co-morbidities.  Education provided on elevated BNP, heart failure exacerbation,  overall debility, frailty, deconditioning, sedentary lifestyle, syncope, and advanced age.  Discussed patient's overall poor functional and mental status as significant indicators of her overall poor prognosis.  I discussed patient's advance directive (on file in ACP tab of epic).  Linda Johnson recalls her mother creating this document in 37.  We reviewed that her mother stated that she would never want to receive life-prolonging measures if she were to have a terminal or incurable disease.  Difference between full code and DNR status discussed.  Linda Johnson endorses patient would never want to receive resuscitative measures in the event that her heart and her lungs were  to stop.  Additionally, she believes her mother would never want to be placed on life support or a ventilator.  Therefore, code status was changed to DNR with limited interventions.    During our discussion, patient was resting in NAD. However, she began to twitch and convulse in the bed.  Her gaze was fixed up to the left, her arms fell into decerebrate posturing, and she was diaphoretic. This seizure-like activity lasted for approximately 20 seconds. RN/medical staff called to bedside.  ED Dr. Dicky, attending Dr. Maree, and RN came to bedside.  Suction provided.  Respirations became agonal, and patient became bradycardic and pale.  Oxygen placed at 15 L via mask.  Stat CT of head ordered. Medical team conveyed to Linda Johnson that despite maximizing medical efforts patient appears to be transitioning and will likely pass away shortly.    Goals of care as well as boundaries of medical treatment discussed with daughter.  She is visibly shaking and crying. Space and opportunity provided for daughter to share thoughts and emotions regarding her mother's current medical situation.  She says she is surprised and in shock that her mother may pass away.  She was hopeful that she had more time with her mother.    We discussed patient's hypotension, bradycardia, agonal breathing, pinpoint pupils, seizure activity, and nonresponsiveness.  She shares understanding that her mother's body is shutting down.  She asked for a timeframe.  Discussed we will continue supportive measures until results of CT scan are known.    Linda Johnson has called patient's other two daughters.  Linda Johnson shares they remain in agreement with DNR and no life-prolonging measures.  They are on their way to the hospital but they [place] of [place] - will likely be here late this evening/tomorrow.  Linda Johnson has called two close friends to be with her in the hospital.  Once results of CT scan are known, I plan to return bedside to discuss shifting to full comfort  measures.  Addendum: Patient returned from CT and actively seized again.  Patient has had 2 seizures in the past 40 minutes.  She remains nonresponsive, hypotensive, agonal breathing, and hypothermic.  At that time, I spoke with patient's daughter and advised to shift to comfort focused care.  CT revealed no acute intracranial abnormalities.  We discussed comfort measures in detail. Mrs. Redford will no longer receive aggressive medical interventions such as continuous vital signs, lab work, radiology testing, or medications not focused on comfort. All care would focus on how she is looking and feeling. This would include management of any symptoms that may cause discomfort, pain, shortness of breath, cough, nausea, agitation, anxiety, and/or secretions etc as well as aggressive management of her seizure activity. Symptoms will be managed with medications and other non-pharmacological interventions such as spiritual support if requested, repositioning, music therapy, or therapeutic listening.   Linda Johnson shares she  does not want her mother to suffer or be in pain.  She is in agreement to shift to full comfort measures. Adjustments made to [facility]. Attending Dr. Maree made aware and in agreement with CMO. RN made aware of adjustments.  Daughter at bedside wishes to speak with neurology.  Discussed with daughter that neurology had been consulted prior to shifting to comfort measures. She requested to hear from neurology anyway. Dr. Lindzen made aware.  Discussed use of Ativan for seizure control with Dr. Maree and Dr. Lindzen. Defer to neurology for further adjustment of medications for seizure activity, though ativan 0.5 Q4H and 2mg  PRN for breakthrough seizures are currently in place.  Linda Johnson verbalized understanding and appreciation for support. Chaplain at bedside for support as well.   PMT will continue to follow and support.   Primary Decision Maker NEXT OF KIN  Physical Exam Constitutional:       Appearance: She is ill-appearing and diaphoretic.  HENT:     Head: Normocephalic and atraumatic.  Cardiovascular:     Rate and Rhythm: Bradycardia present.  Pulmonary:     Effort: No respiratory distress.     Breath sounds: Decreased breath sounds present.     Comments: bradypnea Skin:    General: Skin is warm.     Coloration: Skin is pale.  Neurological:     Comments: nonresponsive  Psychiatric:        Behavior: Behavior is not agitated.     Palliative Assessment/Data: 10%     Thank you for this consult. Palliative medicine will continue to follow and assist holistically.   180 minute visit includes: Detailed review of medical records (labs, imaging, vital signs), medically appropriate exam (mental status, respiratory, cardiac, skin), discussed with treatment team, counseling and educating patient, family and staff, documenting clinical information, medication management and coordination of care.  Signed by: Lamarr Gunner, DNP, FNP-BC Palliative Medicine   Please contact Palliative Medicine Team providers via [facility] for questions and concerns.

## 2024-05-30 NOTE — ED Notes (Signed)
 [employer] Manager, cont to monitor.

## 2024-05-30 NOTE — Plan of Care (Signed)

## 2024-05-30 NOTE — ED Notes (Signed)
 Bair hugger removed at this time and warm blankets placed. Oral temp 97.5

## 2024-05-30 NOTE — IPAL (Signed)
  Interdisciplinary Goals of Care Family Meeting   Date carried out: 05/03/2024  Location of the meeting: Bedside  Member's involved: Physician and Family Member or next of kin  Durable Power of Attorney or acting medical decision maker: [name]  Discussion: We discussed goals of care for [name]   Code status:   Code Status: Do not attempt resuscitation (DNR) - Comfort care   Disposition: In-patient comfort care versus hospice  Time spent for the meeting: 35 minutes    Cresencio Fairly, MD  05/13/2024, 5:00 PM

## 2024-05-30 NOTE — ED Notes (Signed)
 Pts o2 therapy increased to 5 lpm.

## 2024-05-30 NOTE — Progress Notes (Signed)
   05/26/2024 1252  Spiritual Encounters  Type of Visit Initial  Care provided to: Pt and family (Daughter at bedside w/2 of Daughter's friends for support)  Conversation partners present during encounter Nurse;Physician  Referral source Nurse (RN/NT/LPN)  Reason for visit End-of-life  OnCall Visit Yes  Spiritual Framework  Presenting Themes Goals in life/care;Values and beliefs;Impactful experiences and emotions;Other (comment) (for Family)  Values/beliefs Pt is a Christian  Family Stress Factors Exhausted;Other (Comment) (Daughter was in shock at how quickly Pt was dying. Daughter stated that she has extreme anxiety.)  Interventions  Spiritual Care Interventions Made Established relationship of care and support;Compassionate presence;Reflective listening;Normalization of emotions;Supported grief process;Other (comment) (for Family)  Intervention Outcomes  Outcomes Connection to spiritual care;Awareness around self/spiritual resourses;Connection to values and goals of care;Awareness of support;Other (comment) (for Family)

## 2024-05-30 NOTE — ED Notes (Signed)
 Pt placed on bear hugger. Due to rectal temp.

## 2024-05-30 NOTE — ED Provider Notes (Signed)
 Vitals:   June 07, 2024 1030 2024-06-07 1100  BP: (!) 118/57 127/62  Pulse: 98 71  Resp: 17 (!) 9  Temp:    SpO2: (!) 87% 95%     I was called emergently to the bedside for this admitted patient.  There was concern that she had a rapid regression in mental status.  She has bradycardia apneic, unresponsive, pupils are pinpoint and she is hypotensive.  Evidently she has had significant alteration of mental status.  Attending physician Dr. Maree is also at bedside about 5 minutes after my arrival.  Palliative team K Stevan is also present, both palliative and attending physician Dr. Loreli informing the patient is DNR.  At this juncture with her pinpoint pupils and having had a dose of oxycodone  though somewhat remotely, we will trial naloxone , check glucose, and further nonaggressive/nonparetic measures based on her DNR status.  She does have strong carotid pulse but respiratory rate is low and is currently on nonrebreather oxygenating the mid 90s with bradypnea.  She is not to be intubated or resuscitated  Hospitalist, Dr. Maree advising he will continue care.  He is currently considering obtaining CT scan of the head and will follow-up on further   Dicky Anes, MD 2024-06-07 1256

## 2024-05-30 NOTE — Consult Note (Signed)
 Cardiology Consultation   Patient ID: Linda Johnson MRN: [id number]; DOB: 1935/07/17  Admit date: 05/17/2024 Date of Consult: 05-20-24  PCP:  [name] LABOR, MD   [facility] Providers Cardiologist:  None    Physician requesting consult: Dr. Maree Reason for consult: Acute on chronic CHF  Patient Profile: Linda Johnson is a 88 y.o. female with a hx of permanent A-fib on Eliquis , chronic diastolic heart failure, hypertension, PVCs, gait instability who is being seen 2024-05-20 for the evaluation of chest pain at the request of Dr. Maree.  History of Present Illness: Linda Johnson is followed for chronic diastolic CHF patient  She has a history of permanent atrial fibrillation since 2017.  She previously declined cardioversion.  She did not tolerate amiodarone  due to nausea.  She was initially on warfarin but INR was subtherapeutic and she was changed to Eliquis .  Echocardiogram from 2017 shows EF of 60 to 65%.  Echo from 2020 showed EF of 55 to 60%, impaired relaxation, moderately dilated left atrium, mildly dilated right atrium.  Patient takes Lasix  daily.  The patient was last seen 05/09/24 and was stable from a cardiac perspective.  Metoprolol  was increased for better rate control.  We received phone call to the office last week concerning worsening shortness of breath, she was instructed to increase Lasix  from 20 up to 20 twice daily, nurse talked to daughter Olam with recommendations.   The patient presented to the ER 05/17/2024 with chest pain and shortness of breath. Notes from the emergency room reviewed, she presented with worsening shortness of breath, had not increased her Lasix  through the weekend  In the ER blood pressure 137/78, pulse rate 81, respiratory rate 22, 93% O2.  Patient was placed on 3 L of oxygen.  Labs showed WBC 10.9, BNP 1492, high-sensitivity troponin negative x 2, hemoglobin 13.2.  EKG showed A-fib with rates in the 70s, occasional PVCs.  Patient  was given IV Lasix  and admitted for further workup.  On my evaluation breathing was improving, she had made significant urine with Lasix  60 mg IV x 1. Blood pressure was running low after she received Cardura  overnight Additional blood pressure medications held this morning including losartan , further Cardura , hold parameters placed on diltiazem  Heart rate in the 60-70 range   Past Medical History:  Diagnosis Date   Arthritis    hips, knees   Arthritis    Atrial fibrillation (HCC)    chronic   Back pain    Balance problem    Cardiomegaly    Cataract 2020   Right eye December 06, 2018, Left eye date pending   CHF (congestive heart failure) (HCC) 07/2018   chronic   Hypertension    Osteopenia    Pleural effusion 08/25/2018   [facility]   Shingles 08/2017   H/O   SOB (shortness of breath)     Past Surgical History:  Procedure Laterality Date   BREAST BIOPSY Left 2011   2 areas, cysts per pt   CATARACT EXTRACTION W/PHACO Right 12/06/2018   Procedure: CATARACT EXTRACTION PHACO AND INTRAOCULAR LENS PLACEMENT (IOC) RIGHT;  Surgeon: Myrna Adine Anes, MD;  Location: [facility];  Service: Ophthalmology;  Laterality: Right;   CATARACT EXTRACTION W/PHACO Left 01/17/2019   Procedure: CATARACT EXTRACTION PHACO AND INTRAOCULAR LENS PLACEMENT (IOC) LEFT;  Surgeon: Myrna Adine Anes, MD;  Location: [facility];  Service: Ophthalmology;  Laterality: Left;     Home Medications:  Prior to Admission medications   Medication  Sig Start Date End Date Taking? Authorizing Provider  alendronate  (FOSAMAX ) 70 MG tablet Take 1 tablet (70 mg total) by mouth every 7 (seven) days. Take with a full glass of water on an empty stomach. 12/29/23  Yes Tower, Laine LABOR, MD  apixaban  (ELIQUIS ) 5 MG TABS tablet Take 1 tablet (5 mg total) by mouth 2 (two) times daily. 03/04/24  Yes Lorance Pickeral J, MD  diltiazem  (CARDIZEM  CD) 120 MG 24 hr capsule Take 1 capsule (120 mg total) by mouth daily. 05/09/24  Yes  Emiline Mancebo J, MD  doxazosin  (CARDURA ) 8 MG tablet TAKE 1/2 TABLET (4 MG TOTAL) BY MOUTH 2 (TWO) TIMES DAILY. 05/09/24  Yes Pleasant Bensinger J, MD  furosemide  (LASIX ) 20 MG tablet Take 1 tablet (20 mg total) by mouth daily. 05/09/24  Yes Aizza Santiago J, MD  gabapentin  (NEURONTIN ) 100 MG capsule Take 1 capsule (100 mg total) by mouth 2 (two) times daily. 03/21/24  Yes Tower, Laine LABOR, MD  latanoprost  (XALATAN ) 0.005 % ophthalmic solution INSTILL ONE DROP IN BOTH EYES AT BEDTIME. 12/08/16  Yes [provider]  losartan  (COZAAR ) 100 MG tablet Take 1 tablet (100 mg total) by mouth daily. 05/09/24  Yes Real Cona J, MD  metoprolol  succinate (TOPROL -XL) 25 MG 24 hr tablet Take 1 tablet (25 mg total) by mouth 2 (two) times daily. 05/09/24  Yes Cobie Marcoux J, MD  oxyCODONE -acetaminophen  (PERCOCET/ROXICET) 5-325 MG tablet Take 1-2 tablets by mouth every 8 (eight) hours as needed for severe pain (pain score 7-10).   Yes [provider]  potassium chloride  (KLOR-CON ) 10 MEQ tablet Take 1 tablet (10 mEq total) by mouth daily. 05/09/24  Yes Avian Greenawalt J, MD    Scheduled Meds:  apixaban   5 mg Oral BID   diltiazem   120 mg Oral Daily   doxazosin   8 mg Oral BID   furosemide   40 mg Intravenous Q12H   gabapentin   100 mg Oral BID   latanoprost   1 drop Both Eyes QHS   losartan   100 mg Oral Daily   metoprolol  succinate  25 mg Oral BID   potassium chloride   10 mEq Oral Daily   Continuous Infusions:  PRN Meds: acetaminophen  **OR** acetaminophen , magnesium  hydroxide, ondansetron  **OR** ondansetron  (ZOFRAN ) IV, traZODone   Allergies:    Allergies  Allergen Reactions   Ace Inhibitors Cough        Hydrocodone  Bit-Homatrop Mbr Nausea Only   Tramadol Hcl Nausea Only         Social History:   Social History   Socioeconomic History   Marital status: Divorced    Spouse name: Not on file   Number of children: 3   Years of education: 12   Highest education level: High  school graduate  Occupational History   Occupation: retired  Tobacco Use   Smoking status: Never   Smokeless tobacco: Never  Vaping Use   Vaping status: Never Used  Substance and Sexual Activity   Alcohol  use: No    Alcohol /week: 0.0 standard drinks of alcohol    Drug use: No   Sexual activity: Never  Other Topics Concern   Not on file  Social History Narrative   Not on file   Social Drivers of Health   Financial Resource Strain: Low Risk  (01/21/2024)   Overall Financial Resource Strain (CARDIA)    Difficulty of Paying Living Expenses: Not hard at all  Food Insecurity: No Food Insecurity (01/21/2024)   Hunger Vital Sign    Worried About Running  Out of Food in the Last Year: Never true    Ran Out of Food in the Last Year: Never true  Transportation Needs: No Transportation Needs (01/21/2024)   PRAPARE - Administrator, Civil Service (Medical): No    Lack of Transportation (Non-Medical): No  Physical Activity: Insufficiently Active (01/21/2024)   Exercise Vital Sign    Days of Exercise per Week: 7 days    Minutes of Exercise per Session: 20 min  Stress: No Stress Concern Present (01/21/2024)   [employer] of Occupational Health - Occupational Stress Questionnaire    Feeling of Stress: Not at all  Social Connections: Socially Isolated (01/21/2024)   Social Connection and Isolation Panel    Frequency of Communication with Friends and Family: More than three times a week    Frequency of Social Gatherings with Friends and Family: Twice a week    Attends Religious Services: Never    Database Administrator or Organizations: No    Attends Banker Meetings: Never    Marital Status: Divorced  Catering Manager Violence: Not At Risk (01/21/2024)   Humiliation, Afraid, Rape, and Kick questionnaire    Fear of Current or Ex-Partner: No    Emotionally Abused: No    Physically Abused: No    Sexually Abused: No    Family History:    Family History  Problem  Relation Age of Onset   Leukemia Sister    Breast cancer Sister 11   Breast cancer Maternal Aunt 50     ROS:  Please see the history of present illness.  Review of Systems  Constitutional: Negative.   HENT: Negative.    Respiratory:  Positive for shortness of breath.   Cardiovascular: Negative.   Gastrointestinal: Negative.   Musculoskeletal: Negative.   Neurological: Negative.   Psychiatric/Behavioral: Negative.    All other systems reviewed and are negative.   Physical Exam/Data: Vitals:   05/12/2024 0540 05/29/2024 0540 05/16/2024 0600 05/13/2024 0635  BP:  98/72 106/65   Pulse:  62 60   Resp:  18 18   Temp: (!) 94 F (34.4 C)   (!) 96.4 F (35.8 C)  TempSrc: Rectal   Rectal  SpO2:  96% 96%   Weight:      Height:       No intake or output data in the 24 hours ending 05/05/2024 0732    05/17/2024    6:29 PM 05/09/2024   11:49 AM 03/21/2024   11:35 AM  Last 3 Weights  Weight (lbs) 170 lb 174 lb 9.6 oz 170 lb 6.4 oz  Weight (kg) 77.111 kg 79.198 kg 77.293 kg     Body mass index is 30.11 kg/m.  General:  Well nourished, well developed, in no acute distress HEENT: normal Neck: no JVD Vascular: No carotid bruits; Distal pulses 2+ bilaterally Cardiac: Irregularly irregular , no murmur  Lungs:  clear to auscultation bilaterally, no wheezing, rhonchi or rales  Abd: soft, nontender, no hepatomegaly  Ext: no edema Musculoskeletal:  No deformities, BUE and BLE strength normal and equal Skin: warm and dry  Neuro:  CNs 2-12 intact, no focal abnormalities noted Psych:  Normal affect   EKG:  The EKG was personally reviewed and demonstrates:  Afib 72bpm, iRBBB, PVC, nonspecific ST/T wave changes Telemetry:  Telemetry was personally reviewed and demonstrates: Atrial fibrillation rate in the 70s  Relevant CV Studies:  Echo 2020 1. The left ventricle has normal systolic function, with an ejection  fraction  of 55-60%. The cavity size was normal. Left ventricular diastolic   Doppler parameters are consistent with impaired relaxation.   2. The right ventricle has normal systolic function. The cavity was  mildly enlarged. There is no increase in right ventricular wall thickness.  Right ventricular systolic pressure is mildly elevated with an estimated  pressure of 36.8 mmHg.   3. Left atrial size was moderately dilated.   4. Right atrial size was mildly dilated.   Echo 2017 Study Conclusions   - Left ventricle: The cavity size was normal. Systolic function was    normal. The estimated ejection fraction was in the range of 60%    to 65%. Wall motion was normal; there were no regional wall    motion abnormalities. The study is not technically sufficient to    allow evaluation of LV diastolic function.  - Aortic valve: There was trivial regurgitation.  - Left atrium: The atrium was normal in size.  - Right ventricle: Systolic function was normal.  - Pulmonary arteries: Systolic pressure was within the normal    range.   Laboratory Data: High Sensitivity Troponin:  No results for input(s): TROPONINIHS in the last 720 hours.   Chemistry Recent Labs  Lab 05/17/24 1942 2024-06-11 0357  NA 145 144  K 4.4 4.4  CL 102 102  CO2 34* 36*  GLUCOSE 131* 115*  BUN 22 23  CREATININE 0.96 0.97  CALCIUM  9.8 9.1  GFRNONAA 57* 57*  ANIONGAP 10 7    No results for input(s): PROT, ALBUMIN, AST, ALT, ALKPHOS, BILITOT in the last 168 hours. Lipids No results for input(s): CHOL, TRIG, HDL, LABVLDL, LDLCALC, CHOLHDL in the last 168 hours.  Hematology Recent Labs  Lab 05/17/24 1942 Jun 11, 2024 0357  WBC 10.9* 10.9*  RBC 4.18 3.66*  HGB 13.2 11.5*  HCT 41.3 36.8  MCV 98.8 100.5*  MCH 31.6 31.4  MCHC 32.0 31.3  RDW 13.2 13.1  PLT 165 148*   Thyroid  No results for input(s): TSH, FREET4 in the last 168 hours.  BNP Recent Labs  Lab 05/17/24 1942  PROBNP 1,492.0*    DDimer No results for input(s): DDIMER in the last 168  hours.  Radiology/Studies:  DG Chest 2 View Result Date: 05/17/2024 EXAM: 2 VIEW(S) XRAY OF THE CHEST 05/17/2024 07:12:37 PM COMPARISON: None available. CLINICAL HISTORY: CP/SOB FINDINGS: LUNGS AND PLEURA: Mild pulmonary edema and interstitial prominence. Trace left pleural effusion. Small right pleural effusion. No pneumothorax. HEART AND MEDIASTINUM: Cardiomegaly. Aortic atherosclerosis. BONES AND SOFT TISSUES: Old right rib fractures. IMPRESSION: 1. Mild pulmonary edema with interstitial prominence. 2. Trace left and small right pleural effusions. Electronically signed by: Morgane Naveau MD 05/17/2024 08:05 PM EST RP Workstation: [id number]     Assessment and Plan:  Acute on chronic diastolic heart failure Presenting with worsening shortness of breath over the past week , per the notes she was not taking higher dose Lasix  as recommended late last week.  No family at the bedside for further discussion - BNP 1492 and CXR with mild pulmonary edema with interstitial prominence, trace left and small right pleural effusions - IV lasix  40mg  BID - echo from 2020 showed LVEF 60-65%, impaired relaxation - repeat echo ordered  Permanent Afib - EKG shows rate controlled Afib Appears to be tolerating metoprolol  twice daily with diltiazem  ER 120 daily with adequate rate control - continue PTA Eliquis  5mg  BID  HTN As outpatient taking - diltiazem  120mg  daily - cardura  8mg  BID - Losartan  100mg  daily - Toprol   25mg  BID Losartan  and Cardura  on hold given hypotension Hold parameters placed on diltiazem     For questions or updates, please contact [facility] Please consult [email or web address] for contact info under   Signed, Velinda Lunger, MD, Ph.D [facility]

## 2024-05-30 NOTE — Progress Notes (Signed)
 Pt without any cardiopulmonary activity auscultated or palpated. Family @ bs.   2051 Dr Cleatus notified.

## 2024-05-30 NOTE — ED Notes (Signed)
 PARAMEDIC LES INFORMED OF BED ASSISTED

## 2024-05-30 NOTE — ED Notes (Signed)
 Fall risk bundle in place: bed alarm, non slip socks, and yellow fall risk bracelet.

## 2024-05-30 DEATH — deceased

## 2024-06-15 ENCOUNTER — Ambulatory Visit

## 2024-07-05 ENCOUNTER — Ambulatory Visit

## 2024-08-22 ENCOUNTER — Ambulatory Visit: Admitting: Podiatry

## 2025-01-24 ENCOUNTER — Ambulatory Visit
